# Patient Record
Sex: Male | Born: 1960 | Race: White | Hispanic: No | Marital: Married | Smoking: Never smoker
Health system: Southern US, Community
[De-identification: ages and names within clinical notes are randomized; demographics above are authoritative.]

## PROBLEM LIST (undated history)

## (undated) DIAGNOSIS — K219 Gastro-esophageal reflux disease without esophagitis: Secondary | ICD-10-CM

## (undated) DIAGNOSIS — M1990 Unspecified osteoarthritis, unspecified site: Secondary | ICD-10-CM

## (undated) DIAGNOSIS — E669 Obesity, unspecified: Secondary | ICD-10-CM

## (undated) DIAGNOSIS — C801 Malignant (primary) neoplasm, unspecified: Secondary | ICD-10-CM

## (undated) DIAGNOSIS — I10 Essential (primary) hypertension: Secondary | ICD-10-CM

## (undated) HISTORY — DX: Malignant (primary) neoplasm, unspecified: C80.1

## (undated) HISTORY — PX: POLYPECTOMY: SHX149

## (undated) HISTORY — PX: TONSILLECTOMY: SUR1361

## (undated) HISTORY — PX: KNEE SURGERY: SHX244

## (undated) HISTORY — PX: UPPER GASTROINTESTINAL ENDOSCOPY: SHX188

## (undated) HISTORY — PX: APPENDECTOMY: SHX54

## (undated) HISTORY — DX: Obesity, unspecified: E66.9

## (undated) HISTORY — PX: COLONOSCOPY: SHX174

## (undated) HISTORY — DX: Unspecified osteoarthritis, unspecified site: M19.90

## (undated) HISTORY — DX: Essential (primary) hypertension: I10

## (undated) HISTORY — DX: Gastro-esophageal reflux disease without esophagitis: K21.9

---

## 1985-07-19 HISTORY — PX: ANAL FISTULECTOMY: SHX1139

## 1999-12-01 ENCOUNTER — Ambulatory Visit (HOSPITAL_COMMUNITY): Admission: RE | Admit: 1999-12-01 | Discharge: 1999-12-01 | Payer: Self-pay | Admitting: *Deleted

## 1999-12-15 ENCOUNTER — Encounter: Payer: Self-pay | Admitting: *Deleted

## 1999-12-15 ENCOUNTER — Ambulatory Visit (HOSPITAL_COMMUNITY): Admission: RE | Admit: 1999-12-15 | Discharge: 1999-12-15 | Payer: Self-pay | Admitting: *Deleted

## 2001-01-11 ENCOUNTER — Encounter: Payer: Self-pay | Admitting: *Deleted

## 2001-01-11 ENCOUNTER — Encounter: Admission: RE | Admit: 2001-01-11 | Discharge: 2001-01-11 | Payer: Self-pay | Admitting: *Deleted

## 2001-03-07 ENCOUNTER — Encounter: Admission: RE | Admit: 2001-03-07 | Discharge: 2001-06-05 | Payer: Self-pay | Admitting: *Deleted

## 2004-05-25 ENCOUNTER — Ambulatory Visit: Payer: Self-pay | Admitting: Cardiology

## 2007-10-16 ENCOUNTER — Ambulatory Visit: Payer: Self-pay | Admitting: Vascular Surgery

## 2008-10-08 ENCOUNTER — Encounter: Payer: Self-pay | Admitting: Internal Medicine

## 2008-10-08 ENCOUNTER — Ambulatory Visit: Payer: Self-pay | Admitting: Internal Medicine

## 2008-10-15 ENCOUNTER — Encounter: Payer: Self-pay | Admitting: Internal Medicine

## 2008-10-15 ENCOUNTER — Ambulatory Visit: Payer: Self-pay

## 2008-10-30 ENCOUNTER — Telehealth: Payer: Self-pay | Admitting: Internal Medicine

## 2008-10-30 DIAGNOSIS — R002 Palpitations: Secondary | ICD-10-CM | POA: Insufficient documentation

## 2008-11-21 ENCOUNTER — Ambulatory Visit: Payer: Self-pay

## 2008-11-21 ENCOUNTER — Ambulatory Visit: Payer: Self-pay | Admitting: Internal Medicine

## 2008-11-21 DIAGNOSIS — E785 Hyperlipidemia, unspecified: Secondary | ICD-10-CM | POA: Insufficient documentation

## 2008-11-21 LAB — CONVERTED CEMR LAB
ALT: 211 units/L — ABNORMAL HIGH (ref 0–53)
AST: 98 units/L — ABNORMAL HIGH (ref 0–37)
Albumin: 4.2 g/dL (ref 3.5–5.2)
Alkaline Phosphatase: 64 units/L (ref 39–117)
Bilirubin, Direct: 0.1 mg/dL (ref 0.0–0.3)
Cholesterol: 150 mg/dL (ref 0–200)
HDL: 40.6 mg/dL (ref >39.00)
LDL Cholesterol: 88 mg/dL (ref 0–99)
Total Bilirubin: 1.2 mg/dL (ref 0.3–1.2)
Total CHOL/HDL Ratio: 4
Total Protein: 7.2 g/dL (ref 6.0–8.3)
Triglycerides: 106 mg/dL (ref 0.0–149.0)
VLDL: 21.2 mg/dL (ref 0.0–40.0)

## 2008-11-22 ENCOUNTER — Telehealth (INDEPENDENT_AMBULATORY_CARE_PROVIDER_SITE_OTHER): Payer: Self-pay | Admitting: *Deleted

## 2008-11-26 ENCOUNTER — Encounter: Payer: Self-pay | Admitting: Internal Medicine

## 2008-12-03 ENCOUNTER — Telehealth: Payer: Self-pay | Admitting: Internal Medicine

## 2008-12-04 ENCOUNTER — Telehealth (INDEPENDENT_AMBULATORY_CARE_PROVIDER_SITE_OTHER): Payer: Self-pay | Admitting: *Deleted

## 2008-12-10 ENCOUNTER — Ambulatory Visit: Payer: Self-pay

## 2008-12-10 ENCOUNTER — Ambulatory Visit: Payer: Self-pay | Admitting: Internal Medicine

## 2008-12-10 ENCOUNTER — Encounter (INDEPENDENT_AMBULATORY_CARE_PROVIDER_SITE_OTHER): Payer: Self-pay | Admitting: *Deleted

## 2008-12-10 ENCOUNTER — Encounter: Payer: Self-pay | Admitting: Internal Medicine

## 2008-12-10 LAB — CONVERTED CEMR LAB
BUN: 14 mg/dL (ref 6–23)
CO2: 24 meq/L (ref 19–32)
Calcium: 9.3 mg/dL (ref 8.4–10.5)
Chloride: 107 meq/L (ref 96–112)
Creatinine, Ser: 1 mg/dL (ref 0.4–1.5)
GFR calc non Af Amer: 84.45 mL/min (ref >60)
Glucose, Bld: 115 mg/dL — ABNORMAL HIGH (ref 70–99)
Potassium: 4.2 meq/L (ref 3.5–5.1)
Sodium: 139 meq/L (ref 135–145)

## 2008-12-19 ENCOUNTER — Ambulatory Visit (HOSPITAL_COMMUNITY): Admission: RE | Admit: 2008-12-19 | Discharge: 2008-12-19 | Payer: Self-pay | Admitting: Internal Medicine

## 2008-12-19 ENCOUNTER — Ambulatory Visit: Payer: Self-pay | Admitting: Cardiovascular Disease

## 2009-01-09 ENCOUNTER — Telehealth: Payer: Self-pay | Admitting: Internal Medicine

## 2009-01-27 ENCOUNTER — Telehealth: Payer: Self-pay | Admitting: Internal Medicine

## 2009-02-10 ENCOUNTER — Encounter: Payer: Self-pay | Admitting: Internal Medicine

## 2009-03-04 ENCOUNTER — Telehealth: Payer: Self-pay | Admitting: Internal Medicine

## 2009-04-07 ENCOUNTER — Telehealth: Payer: Self-pay | Admitting: Internal Medicine

## 2010-11-20 ENCOUNTER — Other Ambulatory Visit: Payer: Self-pay | Admitting: *Deleted

## 2010-11-20 MED ORDER — METOPROLOL SUCCINATE ER 25 MG PO TB24: 25.0000 mg | ORAL_TABLET | Freq: Every day | ORAL | Status: DC

## 2010-11-20 MED ORDER — LISINOPRIL 10 MG PO TABS: 10.0000 mg | ORAL_TABLET | Freq: Every day | ORAL | Status: DC

## 2010-11-23 ENCOUNTER — Telehealth: Payer: Self-pay | Admitting: Internal Medicine

## 2010-11-23 NOTE — Telephone Encounter (Signed)
Pt has changed Pharm and they have faxed [place] twice regarding refills and we have not responded so he would like to talk to someone about it

## 2010-11-23 NOTE — Telephone Encounter (Signed)
Called in refills for lisinopril 10 mg and metoprolol 25 mg  Quantity  Of 90 with 3 refills on both ./cy

## 2010-12-01 NOTE — Procedures (Signed)
DUPLEX DEEP VENOUS EXAM - UPPER EXTREMITY   INDICATION:  Tingling left shoulder to arm which is intermittent and not  positional.   HISTORY:  Edema:  No.  Trauma/Surgery:  No.  Pain:  No.  PE:  No.  Previous DVT:  No.  Anticoagulants:  No.  Other:   DUPLEX EXAM:                                             Bas/                IJV   SCV     AXV    BrachV  Ceph V                R  L  R   L   R  L   R   L   R  L  Thrombosis    0  0  0   0      0       0      0  Spontaneous   +  +  +   +      +       +      +  Phasic        +  +  +   +      +       +      +  Augmentation  Compressible  +  +  +   +      +       +      +  Competent     +  +  +   +      +       +      +  Legend:  + - yes  o - no  p - partial  D - decreased   IMPRESSION:  1. No evidence of left upper extremity venous thrombosis.  2. Limited evaluation for left thoracic outlet syndrome was negative.  3. Dr. Cleta Alberts was notified of these results.   ___________________________________________  Janetta Hora Darrick Penna, MD   DP/MEDQ  D:  10/16/2007  T:  10/16/2007  Job:  [id number]

## 2010-12-01 NOTE — Letter (Signed)
October 08, 2008    Harrel Lemon. Merla Riches, MD  [address]  [place], [place] [address]   RE:  GAVINN, COLLARD  MRN:  [id number]  /  DOB:  1960-10-01   Dear Nadine Counts,   It was a pleasure seeing Mr. Christensen at your request for his history of  hypertrophic heart disease and his desire to get back to exercise.   The patient is a 50 year old gentleman whom I met 5 years ago at the  request of Dr. Loraine Leriche Pulsipher for risk stratification regarding possible  hypertrophic cardiomyopathy.  His 80 year old brother had died suddenly  at [employer] and the possibility was raised as to whether he had died  as a consequence of hypertrophic cardiomyopathy.  That diagnosis was  difficult to make as even on autopsy the information seemed to be  somewhat discordant.  Specifically, his organ weight was 660 g with  concentric hypertrophy and microscopic examination revealed normal  myelocytes and no inflammation present and so, I then reviewed these  results with Dr. Ananias Pilgrim from the [facility] who felt  that the weight was most consistent with hypertrophic cardiomyopathy  notwithstanding the absence of histopathological confirmation.  At that  point, further issues were raised with Mr. Shimabukuro as to how we might  proceed and he declined.  The evolution of this thinking will be  recounted below.   Intercurrently, he had begun exercise, he had lost a bunch of weight,  his mother then died and he had been her primary caretaker and he has  regained some of that weight.  He has a history of hypertension and some  sleep-disordered breathing.  He has a history of hypertension, although  there has been some discordance between home blood pressure monitoring  and office blood pressure monitoring.   He has a history of sleep-disordered breathing, although not significant  daytime somnolence.   He denies exertional chest pain.   His review of systems is broadly negative across multiple organ  systems.   PAST SURGICAL HISTORY:  Notable for appendectomy and tonsillectomy.   SOCIAL HISTORY:  He is married.  He has 3 children.  He does not use  cigarettes or recreational drugs.  He does drink alcohol as much as 3  drinks per day.  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   His medications includes lisinopril 10, metoprolol succinate 25, and  Advil.   He has no known drug allergies.   On examination, he is a healthy-appearing 50 year old gentleman who  weighs 275 pounds, he is 6 feet and 3 inches, his BMI is 34.4, his blood  pressure is 161/92, his pulse was 73, his respirations were 18.  His  HEENT exam demonstrated no icterus or xanthomata.  His neck veins were  flat.  His carotids were brisk and full bilaterally without bruits.  The  back was without kyphosis or scoliosis.  Lungs were clear.  Heart sounds  were regular without murmurs or gallops.  The abdomen was soft with  active bowel sounds without midline pulsation or hepatomegaly.  Femoral  pulses were 2+.  Distal pulses were intact.  There was no clubbing,  cyanosis, or edema.  Neurological exam was grossly normal.  Skin was  warm and dry.   Electrocardiogram dated yesterday demonstrated sinus rhythm at 73 with  intervals of 0.17/0.09/0.38, the axis was 75 degrees.  The  electrocardiogram was otherwise normal.   IMPRESSION:  1. History of left  ventricular hypertrophy by echo with measurements      about 13 mm, 5 or 6 years ago.  2. Possible family history of hypertrophic cardiomyopathy with the      death of his brother with the pathological findings as noted above.  3. Significant obesity.  4. Elevated blood pressure, question hypertension - white-coat.  5. Sleep-disordered breathing, question sleep apnea.   Nadine Counts, Mr. Warshaw has modest hypertrophy of his left ventricle in the  setting of his brother dying with significant hypertrophic heart  disease, which has been presumed to be  hypertrophic cardiomyopathy based  on my discussions with both, Dr. Ananias Pilgrim and then yesterday with Dr.  Nolon Rod.  The implications of hypertrophic cardiomyopathy relate to  exercise and risk stratification for dying.  To that end, our first goal  is to try to determine whether that is the right diagnosis.  Two issues  come to mind, the first is to repeat the echo and see whether he is now  morphologically more likely to meet criteria for hypertrophic  cardiomyopathy and if so risk stratification would include Holter  monitoring looking for nonsustained ventricular tachycardia and exercise  testing looking for abnormal vasomotor responses.  The second issue is  whether genetic testing may help [place] here.  Dr. Jarold Motto from [facility] and  Dr. Regino Schultze from [facility] have both discussed recently the value of genetic  testing in making a diagnosis of hypertrophic cardiomyopathy and there  is some variability and sensitivity in the range between 60 and 70 and  may be as high as 90%.  However, determination of risk derives from some  of the aforementioned testing and so that approach may be the more  efficient clinically to ascertain risk.  The potential value of genetic  testing here would derive from our ability then to assess Mr. Bertsch  children and their harboring of a gene.  It may well be worth taking  both approaches as mentioned above.   As this relates to his elevated blood pressure, this issue about white-  coat hypertension is certainly something which you are more familiar  then I.  The little bit of reading that I have done recently has  suggested ambulatory blood pressure monitoring helps to distinguish  those people who have real hypertension from those who do not and  dictating therapy.  That may be of some value, although currently on  therapy may not make as much difference.   I will look forward to getting the echo results back to you.  We will  plan thereafter to undertake  stress testing and some manner of fashion  with blood pressure monitoring and then if the evidence further supports  hypertrophic cardiomyopathy, then Holter monitoring and consideration of  genetic testing would be in order.   Thanks very much for asking [place] to see him.    Sincerely,      Duke Salvia, MD, [facility]  Electronically Signed    SCK/MedQ  DD: 10/09/2008  DT: 10/09/2008  Job #: [id number]

## 2010-12-04 NOTE — Procedures (Signed)
[place]  Patient:    Nathaniel Velazquez, Nathaniel Velazquez                    MRN: [id number] Proc. Date: 12/01/99 Adm. Date:  [id number] Disc. Date: [id number] Attending:  Mingo Amber CC:         Colen Darling. Charmayne Sheer., M.D.                           Procedure Report  PROCEDURE PERFORMED:  Video upper endoscopy and video colonoscopy.  ENDOSCOPIST:  Roosvelt Harps, M.D.  INDICATIONS:   A 50 year old male with a prior history of a rectal fistula and a family history of the same who recently had rectal bleeding.  He also is complaining of left upper quadrant discomfort which seems to respond to Prevacid and he has abnormal liver function tests.  Rule out peptic disease of Crohns disease.  PREPARATION:  He is n.p.o. since midnight having taken Phospho-Soda prep and a clear liquid diet.  The mucosa is clean.  PREPROCEDURE SEDATION:   Prior to the upper endoscopy he received a total of 10 mg of Versed and 100 mg of Demerol IV.  In addition, he was on 2L of nasal cannula oxygen and his throat was anesthetized with Hurricaine spray.  DESCRIPTION OF PROCEDURE:  The Olympus video endoscope was inserted via the mouth and advanced easily through the upper esophageal sphincter.  Intubation was then carried out well into the descending duodenum.  On withdrawal, the mucosa was carefully evaluated.  The descending duodenum and bulb appeared normal as did the entire stomach including antrum and body.  On retroflex view of the gastroesophageal junction there may have been minimal laxity of the lower esophageal sphincter area but on withdrawal through this area there was no sign of a hiatal hernia or esophagitis.  The patient tolerated the procedure well.  There were no biopsies taken and no noted complications.  At the conclusion his position was reversed for procedure #2, video colonoscopy. He received no additional sedation.  The Olympus video colonoscope was inserted  via the rectum and advanced easily all the way to the cecum.  The cecal landmarks were identified and photographed.  On withdrawal, the mucosa was carefully evaluated and found to be entirely normal from cecum to retroflex view of the rectum.  There were no areas of inflammation, neoplasia, diverticulosis or significant hemorrhoids noted.  The patient tolerated the procedure well.  There were no noted complications and no biopsies were taken.  IMPRESSION:  Mild rectal bleeding which is likely secondary to a fissure or hemorrhoid which has since resolved.  It should be noted that preprocedure laboratory tests included a normal CBC and sedimentation rate, cholesterol of 147, triglycerides of 116.  Hepatitis serologies were negative for A, B, and C.  Liver function tests revealed an SGOT of 46 and an SGPT of 115.  Lipase and other LKTs were normal.  IMPRESSION:  Essentially normal upper and lower endoscopy in an obese adult male weighing 256 pounds with abnormal liver function tests.  I suspect these are secondary to fatty liver and if he has not yet had a sonogram, it will be ordered today.  He will return to the office in three weeks for follow-up.  He is reassured about his upper and lower endoscopy but he needs to be on a high fiber, low fat, weight reducing diet. RECOMMENDATIONS: DD:  12/01/99 TD:  12/03/99 Job: [id number]

## 2011-01-29 ENCOUNTER — Ambulatory Visit (INDEPENDENT_AMBULATORY_CARE_PROVIDER_SITE_OTHER): Payer: Self-pay | Admitting: General Surgery

## 2011-03-04 ENCOUNTER — Ambulatory Visit (INDEPENDENT_AMBULATORY_CARE_PROVIDER_SITE_OTHER): Payer: BC Managed Care – PPO | Admitting: General Surgery

## 2012-01-26 ENCOUNTER — Other Ambulatory Visit: Payer: Self-pay | Admitting: *Deleted

## 2012-01-26 MED ORDER — LISINOPRIL 10 MG PO TABS: 10.0000 mg | ORAL_TABLET | Freq: Every day | ORAL | Status: DC

## 2012-01-26 MED ORDER — METOPROLOL SUCCINATE ER 25 MG PO TB24: 25.0000 mg | ORAL_TABLET | Freq: Every day | ORAL | Status: DC

## 2012-01-26 NOTE — Telephone Encounter (Signed)
Refilled metoprolol and lisinopril for # 90 only

## 2012-04-21 ENCOUNTER — Other Ambulatory Visit: Payer: Self-pay | Admitting: Internal Medicine

## 2012-04-21 MED ORDER — LISINOPRIL 10 MG PO TABS: 10.0000 mg | ORAL_TABLET | Freq: Every day | ORAL | Status: DC

## 2012-04-21 MED ORDER — METOPROLOL SUCCINATE ER 25 MG PO TB24: 25.0000 mg | ORAL_TABLET | Freq: Every day | ORAL | Status: DC

## 2012-04-21 NOTE — Telephone Encounter (Signed)
Refill :  Office # [phone].   Ref # [id number].

## 2012-05-25 ENCOUNTER — Telehealth: Payer: Self-pay | Admitting: Internal Medicine

## 2012-05-25 NOTE — Telephone Encounter (Signed)
Pt needs an appointment for refills.... Please call to schedule follow up

## 2012-05-25 NOTE — Telephone Encounter (Signed)
Pt needs refills of metoprolol and linsinopril, pt requesting 90 days supply with refills, when done pls call pt wants to make sure  [phone] , uses [email or web address] [phone]

## 2012-06-19 ENCOUNTER — Encounter: Payer: Self-pay | Admitting: Internal Medicine

## 2012-06-19 ENCOUNTER — Other Ambulatory Visit: Payer: Self-pay | Admitting: *Deleted

## 2012-06-19 ENCOUNTER — Ambulatory Visit (INDEPENDENT_AMBULATORY_CARE_PROVIDER_SITE_OTHER): Payer: Self-pay | Admitting: Internal Medicine

## 2012-06-19 VITALS — BP 155/81 | HR 68 | Ht 75.0 in | Wt 285.0 lb

## 2012-06-19 DIAGNOSIS — I10 Essential (primary) hypertension: Secondary | ICD-10-CM | POA: Insufficient documentation

## 2012-06-19 DIAGNOSIS — E669 Obesity, unspecified: Secondary | ICD-10-CM | POA: Insufficient documentation

## 2012-06-19 DIAGNOSIS — E7800 Pure hypercholesterolemia, unspecified: Secondary | ICD-10-CM

## 2012-06-19 DIAGNOSIS — R002 Palpitations: Secondary | ICD-10-CM

## 2012-06-19 DIAGNOSIS — E785 Hyperlipidemia, unspecified: Secondary | ICD-10-CM

## 2012-06-19 LAB — LIPID PANEL
HDL: 49.3 mg/dL (ref >39.00)
Total CHOL/HDL Ratio: 4
Triglycerides: 282 mg/dL — ABNORMAL HIGH (ref 0.0–149.0)
VLDL: 56.4 mg/dL — ABNORMAL HIGH (ref 0.0–40.0)

## 2012-06-19 LAB — LDL CHOLESTEROL, DIRECT: Direct LDL: 91.3 mg/dL

## 2012-06-19 MED ORDER — METOPROLOL SUCCINATE ER 25 MG PO TB24: 25.0000 mg | ORAL_TABLET | Freq: Every day | ORAL | Status: DC

## 2012-06-19 MED ORDER — LISINOPRIL 10 MG PO TABS: 10.0000 mg | ORAL_TABLET | Freq: Every day | ORAL | Status: DC

## 2012-06-19 NOTE — Assessment & Plan Note (Signed)
We will recheck his lipids today.

## 2012-06-19 NOTE — Assessment & Plan Note (Signed)
Blood pressure is borderline elevated. He is having some grogginess with his medications. He feels nervousness and he suggested. Will have him take his medications at night.  The ultimate evaluation from Dr. Modesto Charon at [facility] apparently was that he did not have HCM

## 2012-06-19 NOTE — Patient Instructions (Signed)
LABS:  HgbA1c, TSH, Lipid Profile, Direct LDL.  Start taking meds in the evening.  Your physician wants you to follow-up in: 6 months with Dr. Graciela Husbands. You will receive a reminder letter in the mail two months in advance. If you don't receive a letter, please call our office to schedule the follow-up appointment.

## 2012-06-19 NOTE — Progress Notes (Signed)
History and Physical  Patient ID: Nathaniel Velazquez MRN: [id number], SOB: 1960-10-07 51 y.o. Date of Encounter: 06/19/2012, 3:31 PM  Primary Physician: No primary provider on file. Primary Cardiologist:  New  Primary Electrophysiologist:  Rejoining sk  Chief Complaint: reestablish followup  History of Present Illness: Nathaniel Velazquez is a 51 y.o. male  seen after a hiatus of more than 3 years. We saw him originally about 5 or 6 years ago after the death of his brother in whom an autopsy had demonstrated hypertrophic cardiomyopathy. He had another brother who died suddenly; at autopsy report did not describe hypertrophic cardiomyopathy. He underwent testing here including an MR that was negative but showed mild hypertrophy, treadmill testing with normal blood pressure response and he was then referred to Dr. Regino Schultze at [facility] who felt, according to the patient but for whom we have no corroborated records, that he did not have HCM  He has put on about 30 pounds. He is not exercising. He snores. He has noted no change in his exercise tolerance. He denies chest pain shortness of breath or peripheral edema. There have been no palpitations of late.  He's noted some lightheadedness when he stands. Some grogginess occurs if he takes his blood pressure medications in the morning so he takes them routinely at noon she was  Past Medical History  Diagnosis Date  . Hypertension   . Obesity      No past surgical history on file.    Current Outpatient Prescriptions  Medication Sig Dispense Refill  . lisinopril (PRINIVIL,ZESTRIL) 10 MG tablet Take 1 tablet (10 mg total) by mouth daily.  30 tablet  0  . metoprolol succinate (TOPROL XL) 25 MG 24 hr tablet Take 1 tablet (25 mg total) by mouth daily.  30 tablet  0  . clobetasol ointment (TEMOVATE) 0.05 % As directed         Allergies: No Known Allergies   History  Substance Use Topics  . Smoking status: Never Smoker   . Smokeless tobacco: Not on  file  . Alcohol Use: Yes      Family History  Problem Relation Age of Onset  . Hypertrophic cardiomyopathy Brother       ROS:  Please see the history of present illness.     All other systems reviewed and negative.   Vital Signs: Blood pressure 155/81, pulse 68, height 6\' 3"  (1.905 m), weight 285 lb (129.275 kg).  PHYSICAL EXAM: General:  Well nourished, well developed male in no acute distress  HEENT: normal Lymph: no adenopathy Neck: no JVD Endocrine:  No thryomegaly Vascular: No carotid bruits; FA pulses 2+ bilaterally without bruits Cardiac:  normal S1, S2; RRR; no murmur Back: without kyphosis/scoliosis, no CVA tenderness Lungs:  clear to auscultation bilaterally, no wheezing, rhonchi or rales Abd: soft, nontender, no hepatomegaly Ext: no edema Musculoskeletal:  No deformities, BUE and BLE strength normal and equal Skin: warm and dry Neuro:  CNs 2-12 intact, no focal abnormalities noted Psych:  Normal affect   EKG:  Sinus rhythm at 68 Intervals 17/09/37 Axis LXI Otherwise normal  Labs:   No results found for this basename: WBC,  HGB,  HCT,  MCV,  PLT   No results found for this basename: NA,K,CL,CO2,BUN,CREATININE,CALCIUM,LABALBU,PROT,BILITOT,ALKPHOS,ALT,AST,GLUCOSE in the last 168 hours No results found for this basename: CKTOTAL:4,CKMB:4,TROPONINI:4 in the last 72 hours Lab Results  Component Value Date   CHOL 150 11/21/2008   HDL 40.60 11/21/2008   LDLCALC 88 11/21/2008   TRIG 106.0  11/21/2008   No results found for this basename: DDIMER   BNP No results found for this basename: probnp       ASSESSMENT AND PLAN:

## 2012-06-19 NOTE — Assessment & Plan Note (Signed)
And encourage him to be getting exercise again. We will plan to reassess this in 6 months and undertake a sleep study if he has not lost weight as he does have significant snoring..  In addition, his blood sugar and lipids have been borderline. We will recheck them today.

## 2012-06-27 ENCOUNTER — Encounter: Payer: Self-pay | Admitting: Internal Medicine

## 2012-09-02 ENCOUNTER — Other Ambulatory Visit: Payer: Self-pay

## 2012-12-27 ENCOUNTER — Ambulatory Visit: Payer: BC Managed Care – PPO | Admitting: Internal Medicine

## 2013-01-25 ENCOUNTER — Ambulatory Visit: Payer: BC Managed Care – PPO | Admitting: Internal Medicine

## 2013-03-29 ENCOUNTER — Ambulatory Visit: Payer: BC Managed Care – PPO | Admitting: Internal Medicine

## 2013-05-11 ENCOUNTER — Encounter: Payer: Self-pay | Admitting: *Deleted

## 2013-05-17 ENCOUNTER — Encounter: Payer: Self-pay | Admitting: Internal Medicine

## 2013-05-17 ENCOUNTER — Ambulatory Visit (INDEPENDENT_AMBULATORY_CARE_PROVIDER_SITE_OTHER): Payer: BC Managed Care – PPO | Admitting: Internal Medicine

## 2013-05-17 VITALS — BP 134/80 | HR 66 | Ht 75.0 in | Wt 286.0 lb

## 2013-05-17 DIAGNOSIS — Z0000 Encounter for general adult medical examination without abnormal findings: Secondary | ICD-10-CM

## 2013-05-17 DIAGNOSIS — I10 Essential (primary) hypertension: Secondary | ICD-10-CM

## 2013-05-17 DIAGNOSIS — E785 Hyperlipidemia, unspecified: Secondary | ICD-10-CM

## 2013-05-17 DIAGNOSIS — E7800 Pure hypercholesterolemia, unspecified: Secondary | ICD-10-CM

## 2013-05-17 DIAGNOSIS — R002 Palpitations: Secondary | ICD-10-CM

## 2013-05-17 MED ORDER — METOPROLOL SUCCINATE ER 25 MG PO TB24: 25.0000 mg | ORAL_TABLET | Freq: Every day | ORAL | Status: DC

## 2013-05-17 MED ORDER — LISINOPRIL 10 MG PO TABS: 10.0000 mg | ORAL_TABLET | Freq: Every day | ORAL | Status: DC

## 2013-05-17 NOTE — Patient Instructions (Signed)
Your physician recommends that you schedule a follow-up appointment as needed.   Your physician recommends that you continue on your current medications as directed. Please refer to the Current Medication list given to you today.  

## 2013-05-17 NOTE — Progress Notes (Signed)
      Patient Care Team: Tonye Pearson, MD as PCP - General (Pediatrics)   HPI  Nathaniel Velazquez is a 52 y.o. male Seen in followup for a history of hypertensive heart disease.   We saw him originally about 5 or 6 years ago after the death of his brother in whom an autopsy had demonstrated hypertrophic cardiomyopathy. He had another brother who died suddenly; at autopsy report did not describe hypertrophic cardiomyopathy. He underwent testing here including an MR that was negative but showed mild hypertrophy, treadmill testing with normal blood pressure response and he was then referred to Dr. Regino Schultze at [facility] who felt, according to the patient but for whom we have no corroborated records, that he did not have HCM  He as some complaints of orthostatic intolerance. We talked about taking his medications at night; he continues to take it during the day.  Past Medical History  Diagnosis Date  . Hypertension   . Obesity     No past surgical history on file.  Current Outpatient Prescriptions  Medication Sig Dispense Refill  . clobetasol ointment (TEMOVATE) 0.05 % As directed      . lisinopril (PRINIVIL,ZESTRIL) 10 MG tablet Take 1 tablet (10 mg total) by mouth daily.  30 tablet  0  . metoprolol succinate (TOPROL XL) 25 MG 24 hr tablet Take 1 tablet (25 mg total) by mouth daily.  30 tablet  3   No current facility-administered medications for this visit.    No Known Allergies  Review of Systems negative except from HPI and PMH  Physical Exam BP 134/80  Pulse 66  Ht 6\' 3"  (1.905 m)  Wt 286 lb (129.729 kg)  BMI 35.75 kg/m2 Well developed and nourished in no acute distress HENT normal Neck supple with JVP-flat Clear Regular rate and rhythm, no murmurs or gallops Abd-soft with active BS No Clubbing cyanosis edema Skin-warm and dry A & Oriented  Grossly normal sensory and motor function  ECG demonstrates sinus rhythm at 56  16/10/38 Axis LXXX  Assessment and  Plan

## 2013-05-17 NOTE — Assessment & Plan Note (Signed)
Well controlled on current medications. We have talked about taking his medications in the evening so as to mitigate some of his orthostatic intolerance. He will follow up with Dr. Merla Riches.

## 2013-05-18 ENCOUNTER — Other Ambulatory Visit: Payer: BC Managed Care – PPO

## 2013-05-24 ENCOUNTER — Other Ambulatory Visit: Payer: Self-pay | Admitting: *Deleted

## 2014-03-01 ENCOUNTER — Ambulatory Visit (INDEPENDENT_AMBULATORY_CARE_PROVIDER_SITE_OTHER): Payer: BC Managed Care – PPO | Admitting: Internal Medicine

## 2014-03-01 ENCOUNTER — Other Ambulatory Visit: Payer: Self-pay | Admitting: Internal Medicine

## 2014-03-01 VITALS — BP 136/84 | HR 82 | Temp 98.1°F | Resp 16 | Ht 74.25 in | Wt 280.4 lb

## 2014-03-01 DIAGNOSIS — R209 Unspecified disturbances of skin sensation: Secondary | ICD-10-CM

## 2014-03-01 DIAGNOSIS — R7309 Other abnormal glucose: Secondary | ICD-10-CM

## 2014-03-01 DIAGNOSIS — I10 Essential (primary) hypertension: Secondary | ICD-10-CM

## 2014-03-01 DIAGNOSIS — R202 Paresthesia of skin: Secondary | ICD-10-CM

## 2014-03-01 DIAGNOSIS — R739 Hyperglycemia, unspecified: Secondary | ICD-10-CM

## 2014-03-01 LAB — CBC
HEMATOCRIT: 47.7 % (ref 39.0–52.0)
Hemoglobin: 16.6 g/dL (ref 13.0–17.0)
MCH: 31.3 pg (ref 26.0–34.0)
MCHC: 34.8 g/dL (ref 30.0–36.0)
MCV: 90 fL (ref 78.0–100.0)
Platelets: 222 10*3/uL (ref 150–400)
RBC: 5.3 MIL/uL (ref 4.22–5.81)
RDW: 13.7 % (ref 11.5–15.5)
WBC: 6.1 10*3/uL (ref 4.0–10.5)

## 2014-03-01 LAB — POCT GLYCOSYLATED HEMOGLOBIN (HGB A1C): Hemoglobin A1C: 5.3

## 2014-03-01 LAB — LIPID PANEL
CHOL/HDL RATIO: 3.4 ratio
Cholesterol: 170 mg/dL (ref 0–200)
HDL: 50 mg/dL (ref >39)
LDL Cholesterol: 82 mg/dL (ref 0–99)
TRIGLYCERIDES: 188 mg/dL — AB (ref <150)
VLDL: 38 mg/dL (ref 0–40)

## 2014-03-01 LAB — COMPREHENSIVE METABOLIC PANEL
ALBUMIN: 4.5 g/dL (ref 3.5–5.2)
ALK PHOS: 60 U/L (ref 39–117)
ALT: 97 U/L — ABNORMAL HIGH (ref 0–53)
AST: 41 U/L — ABNORMAL HIGH (ref 0–37)
BUN: 17 mg/dL (ref 6–23)
CO2: 28 mEq/L (ref 19–32)
CREATININE: 1.07 mg/dL (ref 0.50–1.35)
Calcium: 10.2 mg/dL (ref 8.4–10.5)
Chloride: 101 mEq/L (ref 96–112)
GLUCOSE: 112 mg/dL — AB (ref 70–99)
POTASSIUM: 4.8 meq/L (ref 3.5–5.3)
Sodium: 137 mEq/L (ref 135–145)
Total Bilirubin: 0.7 mg/dL (ref 0.2–1.2)
Total Protein: 7.5 g/dL (ref 6.0–8.3)

## 2014-03-01 LAB — TSH: TSH: 2.188 u[IU]/mL (ref 0.350–4.500)

## 2014-03-01 MED ORDER — METOPROLOL SUCCINATE ER 25 MG PO TB24: 25.0000 mg | ORAL_TABLET | Freq: Every day | ORAL | Status: DC

## 2014-03-01 MED ORDER — GABAPENTIN 300 MG PO CAPS: 300.0000 mg | ORAL_CAPSULE | Freq: Three times a day (TID) | ORAL | Status: DC

## 2014-03-01 MED ORDER — LISINOPRIL 10 MG PO TABS: 10.0000 mg | ORAL_TABLET | Freq: Every day | ORAL | Status: DC

## 2014-03-01 NOTE — Progress Notes (Signed)
Left a message for patient to return call to schedule a CPE with Dr. Laney Pastor in the next 3 months.

## 2014-03-01 NOTE — Progress Notes (Addendum)
Subjective:    Patient ID: Nathaniel Velazquez, male    DOB: 12-20-1960, 53 y.o.   MRN: [id number]  HPI Chief Complaint  Patient presents with  . Foot Problem    Left - tingling;numbess x 1 wk  No falls;injuries  . Medication Refill    Lisinopril; Toprol XL   This chart was scribed for Nathaniel Lin, MD by Thea Alken, ED Scribe. This patient was seen in room 1 and the patient's care was started at 9:25 AM.  HPI Comments: Nathaniel Velazquez is a 53 y.o. male who presents to the [facility] with constant, unchanged, left, medial. foot pain onset 1 week. Pt denies falls and injuries to foot prior to pain. Pt describes the pain as tingling and burning but reports he still has touch sensation to foot. Pt denies worsening factors. Pt denies new shoes. Pt denies back pain, thigh, and calf pain. Pt denies trouble with gait. Pt denies rash to foot. Pt denies recent falls and injuries.   Pt states he has been seeing cardiologist, Dr. Caryl Comes and was last seen by him 3 months ago.  Pt was referred to Dr. Caryl Comes due to a family h/o cardiac hypertrophy. Pt reports his brother died from this. Has underlying hypertension and obesity. Pt is requesting a medication refill of lisinopril and toprol XL.  Pt is fasting //there have been questions in the past about borderline hyperglycemia   Past Medical History  Diagnosis Date  . Hypertension   . Obesity    History reviewed. No pertinent past surgical history. Prior to Admission medications   Medication Sig Start Date End Date Taking? Authorizing Provider  clobetasol ointment (TEMOVATE) 0.05 % As directed 05/15/12  Yes Historical Provider, MD  lisinopril (PRINIVIL,ZESTRIL) 10 MG tablet Take 1 tablet (10 mg total) by mouth daily. 05/17/13 05/17/14 Yes Deboraha Sprang, MD  metoprolol succinate (TOPROL XL) 25 MG 24 hr tablet Take 1 tablet (25 mg total) by mouth daily. 05/17/13 05/17/14 Yes Deboraha Sprang, MD   Review of Systems    Musculoskeletal: Negative for back pain and gait problem.  Skin: Negative for rash.  Neurological: Negative for weakness and numbness.   no weight loss or fatigue No fever or night sweats No chest pain or palpitations No shortness of breath No abdominal pain  Objective:   Physical Exam  Nursing note and vitals reviewed. Constitutional: He is oriented to person, place, and time. He appears well-developed and well-nourished. No distress.  HENT:  Head: Normocephalic and atraumatic.  Eyes: Conjunctivae and EOM are normal. Pupils are equal, round, and reactive to light.  Neck: Neck supple. No thyromegaly present.  Cardiovascular: Normal rate and regular rhythm.   Pulmonary/Chest: Effort normal.  Musculoskeletal: Normal range of motion. He exhibits no edema and no tenderness.  Ankle and foot with full ROM without injury. No loss of motor function. There is subjective tingling over the first 3 toes and instep but objectively there is full response. Straight leg raise is negative to 90 degrees. Hip and knee full ROM. Calf exam is normal with intact achilles.   Neurological: He is alert and oriented to person, place, and time. He has normal reflexes. No cranial nerve deficit.  Skin: Skin is warm and dry.  Psychiatric: He has a normal mood and affect. His behavior is normal.    Assessment & Plan:   1. Essential hypertension   2. Paresthesia   3. Hyperglycemia    . Meds ordered this encounter  Medications  . lisinopril (PRINIVIL,ZESTRIL) 10 MG tablet    Sig: Take 1 tablet (10 mg total) by mouth daily.    Dispense:  90 tablet    Refill:  3  . metoprolol succinate (TOPROL XL) 25 MG 24 hr tablet    Sig: Take 1 tablet (25 mg total) by mouth daily.    Dispense:  90 tablet    Refill:  3  . gabapentin (NEURONTIN) 300 MG capsule    Sig: Take 1 capsule (300 mg total) by mouth 3 (three) times daily.    Dispense:  30 capsule    Refill:  0   We'll set up a routine physical examination to  go over his health maintenance issues If he does not respond to treatment next add would be peripheral nerve conduction studies   I personally performed the services described in this documentation, which was scribed in my presence. The recorded information has been reviewed and is accurate.

## 2014-03-04 ENCOUNTER — Telehealth: Payer: Self-pay

## 2014-03-04 LAB — HEPATITIS C ANTIBODY: HCV Ab: NEGATIVE

## 2014-03-04 NOTE — Telephone Encounter (Signed)
Spoke with pt. Will pick up the neurontin and he did receive an email from the mail order saying that they received the rx. All is well.

## 2014-03-04 NOTE — Telephone Encounter (Signed)
Message copied by Constance Goltz on Mon Mar 04, 2014  2:35 PM ------      Message from: Leandrew Koyanagi      Created: Fri Mar 01, 2014 12:14 PM       Call-I sent Neurontin for his ankle problem to his drug store and tried to send his blood pressure medicines to his mail order company. Let us know if this is not successful ------

## 2014-03-05 ENCOUNTER — Encounter: Payer: Self-pay | Admitting: Internal Medicine

## 2014-04-10 ENCOUNTER — Ambulatory Visit (INDEPENDENT_AMBULATORY_CARE_PROVIDER_SITE_OTHER): Payer: BC Managed Care – PPO | Admitting: Internal Medicine

## 2014-04-10 ENCOUNTER — Encounter: Payer: Self-pay | Admitting: Internal Medicine

## 2014-04-10 VITALS — BP 133/79 | HR 64 | Temp 98.0°F | Resp 16 | Ht 74.5 in | Wt 283.0 lb

## 2014-04-10 DIAGNOSIS — K760 Fatty (change of) liver, not elsewhere classified: Secondary | ICD-10-CM

## 2014-04-10 DIAGNOSIS — R945 Abnormal results of liver function studies: Secondary | ICD-10-CM

## 2014-04-10 DIAGNOSIS — G5732 Lesion of lateral popliteal nerve, left lower limb: Secondary | ICD-10-CM

## 2014-04-10 DIAGNOSIS — R7989 Other specified abnormal findings of blood chemistry: Secondary | ICD-10-CM

## 2014-04-10 DIAGNOSIS — Z0000 Encounter for general adult medical examination without abnormal findings: Secondary | ICD-10-CM

## 2014-04-10 DIAGNOSIS — Z23 Encounter for immunization: Secondary | ICD-10-CM

## 2014-04-10 DIAGNOSIS — Z1211 Encounter for screening for malignant neoplasm of colon: Secondary | ICD-10-CM

## 2014-04-10 DIAGNOSIS — K7689 Other specified diseases of liver: Secondary | ICD-10-CM

## 2014-04-10 DIAGNOSIS — I10 Essential (primary) hypertension: Secondary | ICD-10-CM

## 2014-04-10 DIAGNOSIS — G5730 Lesion of lateral popliteal nerve, unspecified lower limb: Secondary | ICD-10-CM

## 2014-04-10 LAB — POCT URINALYSIS DIPSTICK
BILIRUBIN UA: NEGATIVE
Glucose, UA: NEGATIVE
KETONES UA: NEGATIVE
Nitrite, UA: NEGATIVE
Protein, UA: NEGATIVE
RBC UA: NEGATIVE
SPEC GRAV UA: 1.015
Urobilinogen, UA: 0.2
pH, UA: 6

## 2014-04-10 NOTE — Patient Instructions (Signed)
Wt Readings from Last 3 Encounters:  04/10/14 283 lb (128.368 kg)  03/01/14 280 lb 6.4 oz (127.189 kg)  05/17/13 286 lb (129.729 kg)

## 2014-04-10 NOTE — Progress Notes (Addendum)
Subjective:    Patient ID: Nathaniel Velazquez, male    DOB: 05/20/1961, 53 y.o.   MRN: [id number]  This chart was scribed for Leandrew Koyanagi, MD by Edison Simon, ED Scribe. This patient was seen in room 25 and the patient's care was started at 2:00 PM.   HPI  HPI Comments: Nathaniel Velazquez is a 53 y.o. male who presents to the Emergency Department for follow up for neuropathy to his left foot which he states has not improved. He states he was seen here 5-6 weeks ago and was prescribed Neurontin to treat neuropathy; he states he took 1 dose of Neurontin but felt dizzy and sluggish and then jittery that night so he states he did not take it again. He states the symptoms were at first in the ball of his foot and the big toe, but now is present in his second toe as well; he denies tingling anywhere else. He reports moving heavy furniture the day before it initially began. He denies history of diabetes. He states he does not stand a lot for work. He denies heart racing, hearing change, or visual changes.   Patient Active Problem List   Diagnosis Date Noted  . Hypertension   . Obesity   . Hyperlipidemia 11/21/2008  . PALPITATIONS 10/30/2008     Medication List       This list is accurate as of: 04/10/14 11:11 AM.  Always use your most recent med list.               clobetasol ointment 0.05 %  Commonly known as:  TEMOVATE  As directed     gabapentin 300 MG capsule  Commonly known as:  NEURONTIN  Take 1 capsule (300 mg total) by mouth 3 (three) times daily.     lisinopril 10 MG tablet  Commonly known as:  PRINIVIL,ZESTRIL  Take 1 tablet (10 mg total) by mouth daily.     metoprolol succinate 25 MG 24 hr tablet  Commonly known as:  TOPROL XL  Take 1 tablet (25 mg total) by mouth daily.         Review of Systems  HENT: Negative for hearing loss.   Eyes: Negative for visual disturbance.  Cardiovascular: Negative for palpitations.  Musculoskeletal: Positive for arthralgias (left  knee). Negative for back pain.  Neurological: Positive for numbness.       Tingling to his left foot  rest neg 14pt form     Objective:   Physical Exam  Nursing note and vitals reviewed. Constitutional: He is oriented to person, place, and time. He appears well-developed and well-nourished.  HENT:  Head: Normocephalic and atraumatic.  Right Ear: External ear normal.  Left Ear: External ear normal.  Nose: Nose normal.  Mouth/Throat: Oropharynx is clear and moist.  Tms and canals clear  Eyes: Conjunctivae and EOM are normal. Pupils are equal, round, and reactive to light.  Neck: Normal range of motion. Neck supple. No thyromegaly present.  Cardiovascular: Normal rate, regular rhythm, normal heart sounds and intact distal pulses.   No murmur heard. Pulmonary/Chest: Effort normal and breath sounds normal. No respiratory distress. He has no wheezes. He has no rales.  Abdominal: Soft. Bowel sounds are normal. He exhibits no distension and no mass. There is no tenderness. There is no rebound and no guarding.  No hepatosplenomegaly  Musculoskeletal: Normal range of motion. He exhibits no edema and no tenderness.  No signs of muscle atrophy  Lymphadenopathy:    He has  no cervical adenopathy.  Neurological: He is alert and oriented to person, place, and time. He has normal reflexes. No cranial nerve deficit. He exhibits normal muscle tone. Coordination normal.  Normal sensation to left foot  Skin: Skin is warm and dry. No rash noted.  No difference in skin tone of area affected by neuropathy  Psychiatric: He has a normal mood and affect. His behavior is normal. Judgment and thought content normal.   BP 133/79  Pulse 64  Temp(Src) 98 F (36.7 C)  Resp 16  Ht 6' 2.5" (1.892 m)  Wt 283 lb (128.368 kg)  BMI 35.86 kg/m2  SpO2 96%        Assessment & Plan:   I have completed the patient encounter in its entirety as documented by the scribe, with editing by me where  necessary. Mazel Villela P. Laney Pastor, M.D. 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 - Plan: Ambulatory referral to Podiatry  Essential hypertension  Hepatic steatosis w/Abnormal LFTs  Special screening for malignant neoplasms, colon - Plan: Ambulatory referral to Colorectal Surgery  Elevated BMI--needs to adhere to consistent weight loss program  See labs 8/14 Results for orders placed in visit on 04/10/14  POCT URINALYSIS DIPSTICK      Result Value Ref Range   Color, UA yellow     Clarity, UA clear     Glucose, UA neg     Bilirubin, UA neg     Ketones, UA neg     Spec Grav, UA 1.015     Blood, UA neg     pH, UA 6.0     Protein, UA neg     Urobilinogen, UA 0.2     Nitrite, UA neg     Leukocytes, UA Trace

## 2014-04-10 NOTE — Progress Notes (Deleted)
   Subjective:    Patient ID: Nathaniel Velazquez, male    DOB: 1960/10/10, 53 y.o.   MRN: [id number]  HPI    Review of Systems  Neurological: Positive for numbness.       Objective:   Physical Exam        Assessment & Plan:

## 2014-04-15 ENCOUNTER — Telehealth: Payer: Self-pay

## 2014-04-15 NOTE — Telephone Encounter (Signed)
[facility] tried to call pt on 04/11/14. Pt notified and given their number so that he can call them back

## 2014-04-15 NOTE — Telephone Encounter (Signed)
Patient is wanting to know if Dr Laney Pastor was going to refer him to have a colonoscopy.     Best#: [phone]

## 2014-04-22 ENCOUNTER — Ambulatory Visit (INDEPENDENT_AMBULATORY_CARE_PROVIDER_SITE_OTHER): Payer: BC Managed Care – PPO | Admitting: Podiatry

## 2014-04-22 ENCOUNTER — Encounter: Payer: Self-pay | Admitting: Podiatry

## 2014-04-22 ENCOUNTER — Ambulatory Visit (INDEPENDENT_AMBULATORY_CARE_PROVIDER_SITE_OTHER): Payer: BC Managed Care – PPO

## 2014-04-22 VITALS — BP 151/82 | HR 64 | Resp 16

## 2014-04-22 DIAGNOSIS — M779 Enthesopathy, unspecified: Secondary | ICD-10-CM

## 2014-04-22 DIAGNOSIS — G5752 Tarsal tunnel syndrome, left lower limb: Secondary | ICD-10-CM

## 2014-04-22 MED ORDER — TRIAMCINOLONE ACETONIDE 10 MG/ML IJ SUSP
10.0000 mg | Freq: Once | INTRAMUSCULAR | Status: AC
  Administered 2014-04-22: 10 mg

## 2014-04-22 NOTE — Progress Notes (Signed)
   Subjective:    Patient ID: Nathaniel Velazquez, male    DOB: June 09, 1961, 53 y.o.   MRN: [id number]  HPI Comments: "I have some weird feelings in this foot"  Patient c/o numbness and tingling plantar forefoot and 1st, 2nd, 3rd, 4th toes left for 8 weeks. He remembers moving a big piece of furniture and noticed symptoms few days later. Dr. Laney Pastor put on med but stopped after one dose (made him feel funny).  Foot Pain      Review of Systems  All other systems reviewed and are negative.      Objective:   Physical Exam        Assessment & Plan:

## 2014-04-23 NOTE — Progress Notes (Signed)
Subjective:     Patient ID: Nathaniel Velazquez, male   DOB: 1960-08-13, 53 y.o.   MRN: [id number]  Foot Pain   patient points to the left foot stating he's been getting some tingling in his big toe second toe and third toe and that it's been going on for about 8 weeks after he moved his daughter to college. Does not remember any specific cause of this and he was tried on gabapentin which she could not tolerate   Review of Systems  All other systems reviewed and are negative.      Objective:   Physical Exam  Nursing note and vitals reviewed. Constitutional: He is oriented to person, place, and time.  Cardiovascular: Intact distal pulses.   Musculoskeletal: Normal range of motion.  Neurological: He is oriented to person, place, and time.  Skin: Skin is warm.   neurovascular status found to be intact with muscle strength adequate and range of motion of the subtalar and midtarsal joint within normal limits. Patient is found to have mild neurological symptoms on the distal left and did have a positive Tinel's sign left ankle upon percussion. 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 At this time I did do an injection of the tarsal canal 3 mg Kenalog 5 mg Xylocaine and advised that ultimately this may require nerve conduction studies and possible tarsal tunnel release if symptoms were to progress. Patient will be seen back if symptoms persist

## 2014-05-13 ENCOUNTER — Encounter: Payer: Self-pay | Admitting: Internal Medicine

## 2014-06-25 DIAGNOSIS — Z0271 Encounter for disability determination: Secondary | ICD-10-CM

## 2014-07-01 ENCOUNTER — Encounter: Payer: Self-pay | Admitting: Gastroenterology

## 2014-08-30 ENCOUNTER — Telehealth: Payer: Self-pay

## 2014-08-30 ENCOUNTER — Ambulatory Visit (AMBULATORY_SURGERY_CENTER): Payer: Self-pay

## 2014-08-30 VITALS — Ht 75.0 in | Wt 290.0 lb

## 2014-08-30 DIAGNOSIS — Z1211 Encounter for screening for malignant neoplasm of colon: Secondary | ICD-10-CM

## 2014-08-30 MED ORDER — MOVIPREP 100 G PO SOLR: 1.0000 | Freq: Once | ORAL | Status: DC

## 2014-08-30 NOTE — Telephone Encounter (Signed)
Pt wants something for "nerves" pre-op.  Thanks, Levada Dy

## 2014-08-30 NOTE — Progress Notes (Signed)
No allergies to eggs or soy No diet/weight loss meds No home oxygen No past problems with anesthesia  Has email  Emmi instructions given for colonoscopy 

## 2014-09-11 ENCOUNTER — Encounter: Payer: Self-pay | Admitting: Gastroenterology

## 2014-09-13 ENCOUNTER — Encounter: Payer: BC Managed Care – PPO | Admitting: Gastroenterology

## 2014-09-26 ENCOUNTER — Telehealth: Payer: Self-pay | Admitting: Gastroenterology

## 2014-09-26 NOTE — Telephone Encounter (Signed)
Dr Ardis Hughs, I am not familiar with coding practices. How would you like me to handle this?

## 2014-09-27 NOTE — Telephone Encounter (Signed)
No answer-did not leave a message.

## 2014-09-27 NOTE — Telephone Encounter (Signed)
Please let him know that we do not do "office endoscopy."  Our procedures are done in an "ambulatory surgical center."

## 2014-09-30 NOTE — Telephone Encounter (Signed)
Patient is aware 

## 2014-11-27 ENCOUNTER — Encounter: Payer: Self-pay | Admitting: Gastroenterology

## 2014-11-27 ENCOUNTER — Ambulatory Visit (AMBULATORY_SURGERY_CENTER): Payer: BLUE CROSS/BLUE SHIELD | Admitting: Gastroenterology

## 2014-11-27 VITALS — BP 117/65 | HR 54 | Temp 97.5°F | Resp 23 | Ht 75.0 in | Wt 290.0 lb

## 2014-11-27 DIAGNOSIS — D124 Benign neoplasm of descending colon: Secondary | ICD-10-CM | POA: Diagnosis not present

## 2014-11-27 DIAGNOSIS — D123 Benign neoplasm of transverse colon: Secondary | ICD-10-CM | POA: Diagnosis not present

## 2014-11-27 DIAGNOSIS — Z1211 Encounter for screening for malignant neoplasm of colon: Secondary | ICD-10-CM

## 2014-11-27 DIAGNOSIS — D122 Benign neoplasm of ascending colon: Secondary | ICD-10-CM | POA: Diagnosis not present

## 2014-11-27 MED ORDER — SODIUM CHLORIDE 0.9 % IV SOLN: 500.0000 mL | INTRAVENOUS | Status: DC

## 2014-11-27 NOTE — Progress Notes (Signed)
Report to PACU, RN, vss, BBS= Clear.  

## 2014-11-27 NOTE — Patient Instructions (Signed)
YOU HAD AN ENDOSCOPIC PROCEDURE TODAY AT [facility]:   Refer to the procedure report that was given to you for any specific questions about what was found during the examination.  If the procedure report does not answer your questions, please call your gastroenterologist to clarify.  If you requested that your care partner not be given the details of your procedure findings, then the procedure report has been included in a sealed envelope for you to review at your convenience later.  YOU SHOULD EXPECT: Some feelings of bloating in the abdomen. Passage of more gas than usual.  Walking can help get rid of the air that was put into your GI tract during the procedure and reduce the bloating. If you had a lower endoscopy (such as a colonoscopy or flexible sigmoidoscopy) you may notice spotting of blood in your stool or on the toilet paper. If you underwent a bowel prep for your procedure, you may not have a normal bowel movement for a few days.  Please Note:  You might notice some irritation and congestion in your nose or some drainage.  This is from the oxygen used during your procedure.  There is no need for concern and it should clear up in a day or so.  SYMPTOMS TO REPORT IMMEDIATELY:   Following lower endoscopy (colonoscopy or flexible sigmoidoscopy):  Excessive amounts of blood in the stool  Significant tenderness or worsening of abdominal pains  Swelling of the abdomen that is new, acute  Fever of 100F or higher  For urgent or emergent issues, a gastroenterologist can be reached at any hour by calling [phone].  DIET: Your first meal following the procedure should be a small meal and then it is ok to progress to your normal diet. Heavy or fried foods are harder to digest and may make you feel nauseous or bloated.  Likewise, meals heavy in dairy and vegetables can increase bloating.  Drink plenty of fluids but you should avoid alcoholic beverages for 24 hours.  ACTIVITY:   You should plan to take it easy for the rest of today and you should NOT DRIVE or use heavy machinery until tomorrow (because of the sedation medicines used during the test).    FOLLOW UP: Our staff will call the number listed on your records the next business day following your procedure to check on you and address any questions or concerns that you may have regarding the information given to you following your procedure. If we do not reach you, we will leave a message.  However, if you are feeling well and you are not experiencing any problems, there is no need to return our call.  We will assume that you have returned to your regular daily activities without incident.  If any biopsies were taken you will be contacted by phone or by letter within the next 1-3 weeks.  Please call us at [phone] if you have not heard about the biopsies in 3 weeks.   SIGNATURES/CONFIDENTIALITY: You and/or your care partner have signed paperwork which will be entered into your electronic medical record.  These signatures attest to the fact that that the information above on your After Visit Summary has been reviewed and is understood.  Full responsibility of the confidentiality of this discharge information lies with you and/or your care-partner.  Please continue your normal medications  Please read over handout about polyps

## 2014-11-27 NOTE — Progress Notes (Signed)
Called to room to assist during endoscopic procedure.  Patient ID and intended procedure confirmed with present staff. Received instructions for my participation in the procedure from the performing physician.  

## 2014-11-27 NOTE — Op Note (Signed)
[facility]  [address] [place], [address]   COLONOSCOPY PROCEDURE REPORT  PATIENT: Nathaniel Velazquez, Nathaniel Velazquez  MR#: [id number] BIRTHDATE: 1960-07-23 , 74  yrs. old GENDER: male ENDOSCOPIST: Milus Banister, MD REFERRED FG:[id number] Laney Pastor, M.D. PROCEDURE DATE:  11/27/2014 PROCEDURE:   Colonoscopy, screening and Colonoscopy with snare polypectomy First Screening Colonoscopy - Avg.  risk and is 50 yrs.  old or older Yes.  Prior Negative Screening - Now for repeat screening. N/A  History of Adenoma - Now for follow-up colonoscopy & has been > or = to 3 yrs.  N/A  Polyps Removed Today ASA CLASS:   Class II INDICATIONS:Screening for colonic neoplasia and Colorectal Neoplasm Risk Assessment for this procedure is average risk. MEDICATIONS: Monitored anesthesia care and Propofol 400 mg IV  DESCRIPTION OF PROCEDURE:   After the risks benefits and alternatives of the procedure were thoroughly explained, informed consent was obtained.  The digital rectal exam revealed no abnormalities of the rectum.   The LB [id number]  endoscope was introduced through the anus and advanced to the cecum, which was identified by both the appendix and ileocecal valve. No adverse events experienced.   The quality of the prep was excellent.  The instrument was then slowly withdrawn as the colon was fully examined.   COLON FINDINGS: Three sessile polyps ranging between 3-59mm in size were found in the descending colon, transverse colon, and ascending colon.  Polypectomies were performed with a cold snare.  The resection was complete, the polyp tissue was completely retrieved and sent to histology.   The examination was otherwise normal. Retroflexed views revealed no abnormalities. The time to cecum = 0.8 Withdrawal time = 11.4   The scope was withdrawn and the procedure completed. COMPLICATIONS: There were no immediate complications.  ENDOSCOPIC IMPRESSION: 1.  Three sessile polyps ranging  between 3-79mm in size were found in the descending colon, transverse colon, and ascending colon; polypectomies were performed with a cold snare 2.   The examination was otherwise normal  RECOMMENDATIONS: If the polyp(s) removed today are proven to be adenomatous (pre-cancerous) polyps, you will need a colonoscopy in 3-5 years. Otherwise you should continue to follow colorectal cancer screening guidelines for "routine risk" patients with a colonoscopy in 10 years.  You will receive a letter within 1-2 weeks with the results of your biopsy as well as final recommendations.  Please call my office if you have not received a letter after 3 weeks.  eSigned:  Milus Banister, MD 11/27/2014 9:20 AM

## 2014-11-28 ENCOUNTER — Telehealth: Payer: Self-pay | Admitting: *Deleted

## 2014-11-28 NOTE — Telephone Encounter (Signed)
No answer. Name identifier. Message left to call if questions or concerns. 

## 2014-12-17 ENCOUNTER — Telehealth: Payer: Self-pay | Admitting: Gastroenterology

## 2014-12-17 NOTE — Telephone Encounter (Signed)
Patty, please call him.  Please apologize for the delay in communicating results from his colonoscopy.   His polyps were the typical adenomatous polyps we remove. He needs repeat colonoscopy in 3 years.  Santiago Glad, no result note is needed. (the results have still never come back to my in box and so I cannot write a typical results note or results letter)

## 2014-12-18 NOTE — Telephone Encounter (Signed)
Pt has been notified.

## 2014-12-18 NOTE — Telephone Encounter (Signed)
Left message on machine to call back  

## 2015-02-20 ENCOUNTER — Other Ambulatory Visit: Payer: Self-pay | Admitting: Internal Medicine

## 2015-03-14 ENCOUNTER — Other Ambulatory Visit: Payer: Self-pay | Admitting: Internal Medicine

## 2015-03-14 NOTE — Telephone Encounter (Signed)
Pt called because he only received a 30 day supply of the TOPROL, when typically he gets a 90 day supply since this is cheaper. He wants to know if this can be corrected, or does he need to RTC for an OV?

## 2015-03-17 ENCOUNTER — Telehealth: Payer: Self-pay | Admitting: *Deleted

## 2015-03-17 NOTE — Telephone Encounter (Signed)
Spoke with patient needs a follow up visit refilled for 90 days.

## 2015-04-23 ENCOUNTER — Ambulatory Visit (INDEPENDENT_AMBULATORY_CARE_PROVIDER_SITE_OTHER): Payer: BLUE CROSS/BLUE SHIELD | Admitting: Internal Medicine

## 2015-04-23 ENCOUNTER — Encounter: Payer: Self-pay | Admitting: Internal Medicine

## 2015-04-23 VITALS — BP 141/83 | HR 72 | Temp 98.4°F | Resp 16 | Ht 74.5 in | Wt 286.2 lb

## 2015-04-23 DIAGNOSIS — Z23 Encounter for immunization: Secondary | ICD-10-CM | POA: Diagnosis not present

## 2015-04-23 DIAGNOSIS — R7989 Other specified abnormal findings of blood chemistry: Secondary | ICD-10-CM | POA: Diagnosis not present

## 2015-04-23 DIAGNOSIS — K6289 Other specified diseases of anus and rectum: Secondary | ICD-10-CM

## 2015-04-23 DIAGNOSIS — R945 Abnormal results of liver function studies: Secondary | ICD-10-CM

## 2015-04-23 DIAGNOSIS — I10 Essential (primary) hypertension: Secondary | ICD-10-CM

## 2015-04-23 LAB — CBC
HEMATOCRIT: 46 % (ref 39.0–52.0)
HEMOGLOBIN: 16.2 g/dL (ref 13.0–17.0)
MCH: 31.4 pg (ref 26.0–34.0)
MCHC: 35.2 g/dL (ref 30.0–36.0)
MCV: 89.1 fL (ref 78.0–100.0)
MPV: 10.3 fL (ref 8.6–12.4)
Platelets: 260 10*3/uL (ref 150–400)
RBC: 5.16 MIL/uL (ref 4.22–5.81)
RDW: 13 % (ref 11.5–15.5)
WBC: 7.1 10*3/uL (ref 4.0–10.5)

## 2015-04-23 LAB — COMPREHENSIVE METABOLIC PANEL
ALT: 87 U/L — ABNORMAL HIGH (ref 9–46)
AST: 47 U/L — ABNORMAL HIGH (ref 10–35)
Albumin: 4.8 g/dL (ref 3.6–5.1)
Alkaline Phosphatase: 68 U/L (ref 40–115)
BUN: 18 mg/dL (ref 7–25)
CALCIUM: 9.8 mg/dL (ref 8.6–10.3)
CO2: 24 mmol/L (ref 20–31)
Chloride: 102 mmol/L (ref 98–110)
Creat: 1.11 mg/dL (ref 0.70–1.33)
GLUCOSE: 101 mg/dL — AB (ref 65–99)
POTASSIUM: 4.9 mmol/L (ref 3.5–5.3)
Sodium: 136 mmol/L (ref 135–146)
Total Bilirubin: 0.7 mg/dL (ref 0.2–1.2)
Total Protein: 7.4 g/dL (ref 6.1–8.1)

## 2015-04-23 LAB — TSH: TSH: 1.308 u[IU]/mL (ref 0.350–4.500)

## 2015-04-23 LAB — LIPID PANEL
CHOL/HDL RATIO: 3.4 ratio (ref <=5.0)
Cholesterol: 169 mg/dL (ref 125–200)
HDL: 49 mg/dL (ref >=40)
LDL CALC: 87 mg/dL (ref <130)
Triglycerides: 166 mg/dL — ABNORMAL HIGH (ref <150)
VLDL: 33 mg/dL — ABNORMAL HIGH (ref <30)

## 2015-04-23 MED ORDER — LISINOPRIL 10 MG PO TABS: ORAL_TABLET | ORAL | Status: DC

## 2015-04-23 MED ORDER — METOPROLOL SUCCINATE ER 25 MG PO TB24: ORAL_TABLET | ORAL | Status: DC

## 2015-04-23 MED ORDER — HYDROCORTISONE ACETATE 25 MG RE SUPP: 25.0000 mg | Freq: Two times a day (BID) | RECTAL | Status: DC

## 2015-04-23 NOTE — Progress Notes (Signed)
   Subjective:    Patient ID: Nathaniel Velazquez, male    DOB: 06/16/1961, 54 y.o.   MRN: [id number]  HPI This is a pleasant 54 yo male who presents today for follow up of HTN, hyperlipidemia, elevated LFTs, rectal pain. He was last seen 03/2014. He had a screening colonoscopy which found adenomatous polyps- 67yr recall.   He had knee surgery in the spring and has not been satisfied with result- continues to have swelling and pain. He has been having problems with rectal pain which has made it more difficult to ride the exercise bike. Takes ibuprofen 200 mg- 3 tablets daily. Has had had rectal pain, no bleeding. No relief with Preparation H. No external bulging.   Continues to have eczema on hands, worse fall/winter. Uses clobetesol. Sees dermatology.   Review of Systems No chest pain or SOB, no edema, no headaches    Objective:   Physical Exam Physical Exam  Constitutional: Oriented to person, place, and time. He appears well-developed and well-nourished.  HENT:  Head: Normocephalic and atraumatic.  Eyes: Conjunctivae are normal.  Neck: Normal range of motion. Neck supple.  Cardiovascular: Normal rate, regular rhythm and normal heart sounds.   Pulmonary/Chest: Effort normal and breath sounds normal.  Musculoskeletal: Normal range of motion.  Neurological: Alert and oriented to person, place, and time.  Skin: Skin is warm and dry. Hands with some peeling on palms and distal fingers.  Psychiatric: Normal mood and affect. Behavior is normal. Judgment and thought content normal.  He declined rectal exam.   Vitals reviewed. BP 141/83 mmHg  Pulse 72  Temp(Src) 98.4 F (36.9 C) (Oral)  Resp 16  Ht 6' 2.5" (1.892 m)  Wt 286 lb 3.2 oz (129.819 kg)  BMI 36.27 kg/m2 Wt Readings from Last 3 Encounters:  04/23/15 286 lb 3.2 oz (129.819 kg)  11/27/14 290 lb (131.543 kg)  08/30/14 290 lb (131.543 kg)      Assessment & Plan:  1. Essential hypertension - CBC - Lipid panel - TSH -  Comprehensive metabolic panel - Hemoglobin A1c - lisinopril (PRINIVIL,ZESTRIL) 10 MG tablet; TAKE 1 BY MOUTH DAILY  Dispense: 90 tablet; Refill: 3 - metoprolol succinate (TOPROL-XL) 25 MG 24 hr tablet; TAKE 1 BY MOUTH DAILY. Dispense: 90 tablet; Refill: 3  2. Abnormal LFTs - Comprehensive metabolic panel  3. Need for prophylactic vaccination and inoculation against influenza - Flu Vaccine QUAD 36+ mos IM  4. Rectal pain - suspect fissure - hydrocortisone (ANUSOL-HC) 25 MG suppository; Place 1 suppository (25 mg total) rectally 2 (two) times daily.  Dispense: 12 suppository; Refill: 0 - he will let me know if this helps his pain  5. Obesity - discussed barriers to healthy food choices and ways to overcome these, encouraged exercise and portion control.   - follow up 6 months  [name], FNP-BC  [facility], [facility]  04/23/2015 5:40 PM

## 2015-04-24 LAB — HEMOGLOBIN A1C
HEMOGLOBIN A1C: 5.6 % (ref <5.7)
Mean Plasma Glucose: 114 mg/dL (ref <117)

## 2015-05-06 ENCOUNTER — Telehealth: Payer: Self-pay | Admitting: Family Medicine

## 2015-05-06 NOTE — Telephone Encounter (Signed)
Spoke with pt about his new appt time and sent letter

## 2015-05-26 ENCOUNTER — Encounter: Payer: Self-pay | Admitting: Family Medicine

## 2015-05-26 ENCOUNTER — Telehealth: Payer: Self-pay

## 2015-05-26 ENCOUNTER — Ambulatory Visit (INDEPENDENT_AMBULATORY_CARE_PROVIDER_SITE_OTHER): Payer: BLUE CROSS/BLUE SHIELD | Admitting: Family Medicine

## 2015-05-26 VITALS — BP 138/86 | HR 63 | Temp 97.3°F | Resp 16 | Ht 74.5 in | Wt 286.0 lb

## 2015-05-26 DIAGNOSIS — R21 Rash and other nonspecific skin eruption: Secondary | ICD-10-CM

## 2015-05-26 DIAGNOSIS — K602 Anal fissure, unspecified: Secondary | ICD-10-CM | POA: Diagnosis not present

## 2015-05-26 MED ORDER — DILTIAZEM GEL 2 %: 1.0000 "application " | Freq: Two times a day (BID) | CUTANEOUS | Status: DC

## 2015-05-26 MED ORDER — NYSTATIN-TRIAMCINOLONE 100000-0.1 UNIT/GM-% EX OINT: 1.0000 "application " | TOPICAL_OINTMENT | Freq: Two times a day (BID) | CUTANEOUS | Status: DC

## 2015-05-26 NOTE — Telephone Encounter (Signed)
It is safe for him to take Colace daily as needed for a stool softener. It should not interfere with his other medications. It is important to drink a lot of water for the colace to be effective.

## 2015-05-26 NOTE — Telephone Encounter (Signed)
Pt advised and understood. 

## 2015-05-26 NOTE — Patient Instructions (Signed)
Anal Fissure, Adult °An anal fissure is a small tear or crack in the skin around the anus. Bleeding from a fissure usually stops on its own within a few minutes. However, bleeding will often occur again with each bowel movement until the crack heals. °CAUSES °This condition may be caused by: °· Passing large, hard stool (feces). °· Frequent diarrhea. °· Constipation. °· Inflammatory bowel disease (Crohn disease or ulcerative colitis). °· Infections. °· Anal sex. °SYMPTOMS °Symptoms of this condition include: °· Bleeding from the rectum. °· Small amounts of blood seen on your stool, on toilet paper, or in the toilet after a bowel movement. °· Painful bowel movements. °· Itching or irritation around the anus. °DIAGNOSIS  °A health care provider may diagnose this condition by closely examining the anal area. An anal fissure can usually be seen with careful inspection. In some cases, a rectal exam may be performed, or a short tube (anoscope) may be used to examine the anal canal. °TREATMENT °Treatment for this condition may include: °· Taking steps to avoid constipation. This may include making changes to your diet, such as increasing your intake of fiber or fluid. °· Taking fiber supplements. These supplements can soften your stool to help make bowel movements easier. Your health care provider may also prescribe a stool softener if your stool is often hard. °· Taking sitz baths. This may help to heal the tear. °· Using medicated creams or ointments. These may be prescribed to lessen discomfort. °HOME CARE INSTRUCTIONS °Eating and Drinking °· Avoid foods that may be constipating, such as bananas and dairy products. °· Drink enough fluid to keep your urine clear or pale yellow. °· Maintain a diet that is high in fruits, whole grains, and vegetables. °General Instructions °· Keep the anal area as clean and dry as possible. °· Take sitz baths as told by your health care provider. Do not use soap in the sitz baths. °· Take  over-the-counter and prescription medicines only as told by your health care provider. °· Use creams or ointments only as told by your health care provider. °· Keep all follow-up visits as told by your health care provider. This is important. °SEEK MEDICAL CARE IF: °· You have more bleeding. °· You have a fever. °· You have diarrhea that is mixed with blood. °· You continue to have pain. °· Your problem is getting worse rather than better. °  °This information is not intended to replace advice given to you by your health care provider. Make sure you discuss any questions you have with your health care provider. °  °Document Released: 07/05/2005 Document Revised: 03/26/2015 Document Reviewed: 09/30/2014 °Elsevier Interactive Patient Education ©2016 [employer] ° °

## 2015-05-26 NOTE — Progress Notes (Addendum)
   Subjective:    Patient ID: Nathaniel Velazquez, male    DOB: 06-14-1961, 54 y.o.   MRN: [id number]  HPI Patient presents today for follow up of hemorrhoids. He has recently been working at the [employer] and has been having pain for several weeks. He has tried a high fiber diet which resulted in large number of loose stools which exacerbated his symptoms. He did not have relief with hydrocortisone suppositories. He has pain in his rectum, "feels like a knife". This is worse with bowel movements. No significant bulging, rare bleeding. He has a history of anal fistulectomy many years ago. He feels like it is difficult to get clean following bowel movements and he frequently takes a bath or uses wipes. Had screening colonoscopy 5/16. Rectum normal. Few small polyps removed.   He has a chronic rash under left axilla. He was told by his dermatologist that is is yeast and suggested he mix OTC antifungal cream with OTC steroid cream. He has done this with some relief but not complete clearing.   Knee is finally doing better following surgery. He continues to take ibuprofen 400-600 mg BID-TID. He is trying to walk more and watch his diet.   Review of Systems Per HPI    Objective:   Physical Exam  Constitutional: He is oriented to person, place, and time. He appears well-developed and well-nourished.  HENT:  Head: Normocephalic and atraumatic.  Eyes: Conjunctivae are normal.  Neck: Normal range of motion. Neck supple.  Cardiovascular: Normal rate and regular rhythm.   Pulmonary/Chest: Effort normal.  Genitourinary:  Rectum with some erythema and surrounding skin moist with erythema.   Musculoskeletal: Normal range of motion.  Neurological: He is alert and oriented to person, place, and time.  Skin: Skin is warm and dry.  Left axilla with with approximately 5-6 cm area erythema. Slightly moist.   Psychiatric: He has a normal mood and affect. His behavior is normal. Judgment and  thought content normal.  Vitals reviewed. BP recheck 138/86 BP 152/83 mmHg  Pulse 63  Temp(Src) 97.3 F (36.3 C)  Resp 16  Ht 6' 2.5" (1.892 m)  Wt 286 lb (129.729 kg)  BMI 36.24 kg/m2  Wt Readings from Last 3 Encounters:  05/26/15 286 lb (129.729 kg)  04/23/15 286 lb 3.2 oz (129.819 kg)  11/27/14 290 lb (131.543 kg)      Assessment & Plan:  1. Anal fissure - Provided written and verbal information regarding diagnosis and treatment. - diltiazem 2 % GEL; Apply 1 application topically 2 (two) times daily.  Dispense: 30 g; Refill: 1 - suggested probiotic to help regulate bowel movements - if no improvement in 2 weeks, follow up with Dr. Ardis Hughs  2. Rash and nonspecific skin eruption - nystatin-triamcinolone ointment (MYCOLOG); Apply 1 application topically 2 (two) times daily. Apply sparingly to affected areas for up to 7 days  Dispense: 30 g; Refill: 0  3. Essential HTN - repeat BP in office WNL and patient reports his home readings are in the 120s/80s range consistently.  - continue lisinopril 10 mg - follow up 6 months.   [name], FNP-BC  [facility]  05/26/2015 8:49 AM

## 2015-05-26 NOTE — Telephone Encounter (Signed)
Pt just saw debbie gessner today and has another question can he take 100mg  of colace once or twice a day along with the other medications and for how long can he take this

## 2015-06-04 ENCOUNTER — Telehealth: Payer: Self-pay

## 2015-06-04 ENCOUNTER — Telehealth: Payer: Self-pay | Admitting: Gastroenterology

## 2015-06-04 NOTE — Telephone Encounter (Signed)
Pt states that  He would like to let gessner know that symtoms are the same with no relief would like to go ahead with referral

## 2015-06-04 NOTE — Telephone Encounter (Signed)
Pt notified. He has already put in a call to Dr. Ardis Hughs.  Advised if he cannot get him in ASAP to call us back and we will call.

## 2015-06-04 NOTE — Telephone Encounter (Signed)
The referral will be to GI. He has seen Dr. Ardis Hughs at [facility] recently and he can call make an appointment or we can.

## 2015-06-04 NOTE — Telephone Encounter (Signed)
Pt states he has been having rectal pain for about 2 mths. States he has tried suppositories and nothing has helped. Pt requests to be seen. Pt scheduled to see Alonza Bogus PA tomorrow at 8:30am. Pt aware of appt.

## 2015-06-05 ENCOUNTER — Telehealth: Payer: Self-pay | Admitting: Gastroenterology

## 2015-06-05 ENCOUNTER — Ambulatory Visit (INDEPENDENT_AMBULATORY_CARE_PROVIDER_SITE_OTHER): Payer: BLUE CROSS/BLUE SHIELD | Admitting: Gastroenterology

## 2015-06-05 ENCOUNTER — Ambulatory Visit (INDEPENDENT_AMBULATORY_CARE_PROVIDER_SITE_OTHER)
Admission: RE | Admit: 2015-06-05 | Discharge: 2015-06-05 | Disposition: A | Discharge status: Home / Self Care | Payer: BLUE CROSS/BLUE SHIELD | Source: Ambulatory Visit | Attending: Gastroenterology | Admitting: Gastroenterology

## 2015-06-05 ENCOUNTER — Telehealth: Payer: Self-pay | Admitting: *Deleted

## 2015-06-05 ENCOUNTER — Encounter: Payer: Self-pay | Admitting: Gastroenterology

## 2015-06-05 VITALS — BP 130/70 | HR 84 | Ht 74.5 in | Wt 280.8 lb

## 2015-06-05 DIAGNOSIS — K6289 Other specified diseases of anus and rectum: Secondary | ICD-10-CM

## 2015-06-05 MED ORDER — DICYCLOMINE HCL 20 MG PO TABS: ORAL_TABLET | ORAL | Status: DC

## 2015-06-05 MED ORDER — CIPROFLOXACIN HCL 500 MG PO TABS: 500.0000 mg | ORAL_TABLET | Freq: Two times a day (BID) | ORAL | Status: DC

## 2015-06-05 MED ORDER — IOHEXOL 300 MG/ML  SOLN
80.0000 mL | Freq: Once | INTRAMUSCULAR | Status: AC | PRN
  Administered 2015-06-05: 80 mL via INTRAVENOUS

## 2015-06-05 MED ORDER — METRONIDAZOLE 500 MG PO TABS: 500.0000 mg | ORAL_TABLET | Freq: Three times a day (TID) | ORAL | Status: DC

## 2015-06-05 NOTE — Progress Notes (Signed)
     06/05/2015 Nathaniel Velazquez [id number] 1961/04/22   History of Present Illness:  This is a 54 year old male who is known to Dr. Ardis Hughs for colonoscopy in May 2016 at which time he was found to have 3 tubular adenomas that were removed; recall recommended in 3 years from that time.  He presents to our office today with complaints of rectal/anal pain.  This has been present since September but worsening with severe pain for the past one month.  Cannot even sit.  Has tried hydrocortisone suppositories without relief and has been using diltiazem gel for the past 9 days or so.  Has severe spasm that last for a couple of hours after a BM.  Denies fever or chills.  No abdominal pain, but feels shooting pain in his rectum and to his spine at times.    Has history of anal fistulectomy 30 years ago for a peri-anal abscess of some sort that required surgery; unknown cause of this.   Current Medications, Allergies, Past Medical History, Past Surgical History, Family History and Social History were reviewed in [employer] record.   Physical Exam: BP 130/70 mmHg  Pulse 84  Ht 6' 2.5" (1.892 m)  Wt 280 lb 12.8 oz (127.37 kg)  BMI 35.58 kg/m2 General: Well developed white male in no acute distress Head: Normocephalic and atraumatic Eyes:  Sclerae anicteric, conjunctiva pink  Ears: Normal auditory acuity Abdomen: Soft, non-distended.  Normal bowel sounds.  Non-tender.   Rectal:  Peri-anal excoriation seen on both the left and the right.  Exquisitely tender on exam so that DRE and anoscopy could not be performed. Musculoskeletal: Symmetrical with no gross deformities  Extremities: No edema  Neurological: Alert oriented x 4, grossly non-focal Psychological:  Alert and cooperative. Normal mood and affect  Assessment and Recommendations: -Rectal pain:  Has some external excoriation and is extremely tender.  No DRE or anoscopy could be performed.  Will check CT scan of the pelvis  to rule out peri-rectal/peri-anal abscess.  Will start antibiotics in the form of cipro 500 mg BID for 10 days and flagyl 500 mg TID for 10 days.  Discontinue diltiazem and hydrocortisone suppositories.  Will apply zinc oxide several times per day.

## 2015-06-05 NOTE — Telephone Encounter (Signed)
Per Alonza Bogus, PA may have dicyclomine 20 mg TID prn spasms #30. Patient aware and rx sent.

## 2015-06-05 NOTE — Progress Notes (Signed)
i agree with the above note, plan 

## 2015-06-05 NOTE — Telephone Encounter (Signed)
Patient is calling for results. Please, advise. 

## 2015-06-05 NOTE — Patient Instructions (Signed)
We sent a prescription for Flagyl 500 mg 3 times daily x 10 days. [facility] [address] [employer]  Aslo sent Cipro 500 mg twice daily x 10 days.  Stop the diltiazem gel and suppositories. Apply Desitin ( Zinc oxide)  Multiple times daily.   You have been scheduled for a CT scan of the abdomen and pelvis at [facility] ([address]---this is in the same building as Press [facility]).   You are scheduled on 06-05-2015 at 1:15 PM. You should arrive at 1:00 PM prior to your appointment time for registration. Please follow the written instructions below on the day of your exam:  WARNING: IF YOU ARE ALLERGIC TO IODINE/X-RAY DYE, PLEASE NOTIFY RADIOLOGY IMMEDIATELY AT [phone]! YOU WILL BE GIVEN A 13 HOUR PREMEDICATION PREP.  1) Do not eat  anything after  9:30 am  (4 hours prior to your test) 2) You have been given 2 bottles of oral contrast to drink. The solution may taste better if refrigerated, but do NOT add ice or any other liquid to this solution. Shake well before drinking.    Drink 1 bottle of contrast @ 11:15 am (2 hours prior to your exam)  Drink 1/2  bottle of contrast @ 12:15 PM (1 hour prior to your exam)  You may take any medications as prescribed with a small amount of water except for the following: Metformin, Glucophage, Glucovance, Avandamet, Riomet, Fortamet, Actoplus Met, Janumet, Glumetza or Metaglip. The above medications must be held the day of the exam AND 48 hours after the exam.  The purpose of you drinking the oral contrast is to aid in the visualization of your intestinal tract. The contrast solution may cause some diarrhea. Before your exam is started, you will be given a small amount of fluid to drink. Depending on your individual set of symptoms, you may also receive an intravenous injection of x-ray contrast/dye. Plan on being at [facility] for 30 minutes or long, depending on the type of exam you are having performed.  If you  have any questions regarding your exam or if you need to reschedule, you may call the CT department at [phone] between the hours of 8:00 am and 5:00 pm, Monday-Friday.  ________________________________________________________________________

## 2015-06-11 MED ORDER — METRONIDAZOLE 500 MG PO TABS: 500.0000 mg | ORAL_TABLET | Freq: Three times a day (TID) | ORAL | Status: DC

## 2015-06-11 MED ORDER — CIPROFLOXACIN HCL 500 MG PO TABS: 500.0000 mg | ORAL_TABLET | Freq: Two times a day (BID) | ORAL | Status: DC

## 2015-06-11 NOTE — Telephone Encounter (Signed)
Pt states that the pain level has gone from at 5 to a 3. States he has improved but he does have the one spot that is still quite bothersome. Alonza Bogus PA notified of update.

## 2015-06-11 NOTE — Telephone Encounter (Signed)
Please have the patient completed course of antibiotics. I believe we gave him 10 days of antibiotics but would like him to take a 14 day course if we can please send prescriptions for 4 more days of the medication. Follow up with [name] on 12/5 as discussed for thorough reexamination.  Thank you,  Jess

## 2015-06-11 NOTE — Telephone Encounter (Signed)
Spoke with pt and he is aware. Pt scheduled to see [name] Hvozdovic, PA-C 06/23/15@2 :45pm. Script sent to pharmacy.

## 2015-06-16 ENCOUNTER — Encounter: Payer: Self-pay | Admitting: Internal Medicine

## 2015-06-17 ENCOUNTER — Telehealth: Payer: Self-pay | Admitting: Gastroenterology

## 2015-06-17 NOTE — Telephone Encounter (Signed)
We can have him try lidocaine gel (OTC Recticare) several times per day for discomfort, but I am hesitant to have him use hydrocortisone, etc until he is seen back and re-examined thoroughly.  Can try some Ibuprofen orally a couple of times per day to see if that helps for the next few days.  Thank you,  Jess

## 2015-06-17 NOTE — Telephone Encounter (Signed)
Spoke with patient and he states he is still having rectal pain. He is doing better but still has one spot that is sore. He has 2 days left of antibiotics. 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 Dicyclomine has helped his bowels. He is using Desitin. He is asking what else he can do until OV on 06/23/15. Please, advise.

## 2015-06-18 NOTE — Telephone Encounter (Signed)
Patient given recommendations. 

## 2015-06-23 ENCOUNTER — Ambulatory Visit (INDEPENDENT_AMBULATORY_CARE_PROVIDER_SITE_OTHER): Payer: BLUE CROSS/BLUE SHIELD | Admitting: Physician Assistant

## 2015-06-23 ENCOUNTER — Encounter: Payer: Self-pay | Admitting: Physician Assistant

## 2015-06-23 VITALS — BP 138/70 | HR 78 | Wt 281.0 lb

## 2015-06-23 DIAGNOSIS — K6289 Other specified diseases of anus and rectum: Secondary | ICD-10-CM

## 2015-06-23 MED ORDER — DICYCLOMINE HCL 20 MG PO TABS: 20.0000 mg | ORAL_TABLET | Freq: Three times a day (TID) | ORAL | Status: DC

## 2015-06-23 NOTE — Patient Instructions (Signed)
You have been scheduled for an appointment at [facility]. Your appointment is on 06-25-2015 at 1:45pm. Please arrive at 1:30pm for registration. Make certain to bring a list of current medications, including any over the counter medications or vitamins. Also bring your co-pay if you have one as well as your insurance cards. [facility] is located at [address], [address]. Should you need to reschedule your appointment, please contact them at [phone].   We have sent the following medications to your pharmacy for you to pick up at your convenience. Bentyl  Use your Diltiazem ointment 2% twice a day for 1 week.  You can apply [place] diaper rash ointment to perirectal area.

## 2015-06-23 NOTE — Progress Notes (Signed)
Patient ID: Nathaniel Velazquez, male   DOB: 10-Feb-1961, 54 y.o.   MRN: [id number]     History of Present Illness: Nathaniel Velazquez is a 53 year old male who is known to Dr. Ardis Hughs. He had a colonoscopy in May 2006 at which time he was found to have 3 tubular adenomas that were removed. He was advised to have surveillance in 3 years. He was seen in the office by Alonza Bogus, PA, on November 17 with complaints of rectal/anal pain that had been present since September. He was unable to even sit. He had tried hydrocortisone suppositories and diltiazem gel with little relief. He was complaining of severe spasms that lasted for several hours after each bowel movement. He has a history of an anal fistulectomy 30 years ago for perianal abscess of some sort that required surgery. At his last exam he was noted to have perianal excoriation on both the left and right exam was exquisitely tender so that a digital rectal exam and anoscopy could not be performed. He was sent for a CT of the pelvis to rule out perirectal/perianal abscess. The test was nonrevealing for the above. He was empirically treated with Cipro 500 mg twice a day and Flagyl 500 mg 3 times a day for 14 days. He was advised to using oxide several times daily. He was also advised to use Bentyl 20 mg 3 times a day. He states on the Bentyl he had no further rectal spasm. He began to feel better but once he ran out of Bentyl and completed the antibiotics his pain came back. He has been missing work because his rectal pain is so severe. He states if he has to sit for a long period he cannot take the pain. He is moving his bowels several times per day. He states his stools are soft and he is not straining. He has not had any rectal bleeding.    Past Medical History  Diagnosis Date  . Hypertension   . Obesity     Steroid injection in rt knee  . GERD (gastroesophageal reflux disease)     Past Surgical History  Procedure Laterality Date  . Appendectomy    .  Anal fistulectomy  1987  . Tonsillectomy     Family History  Problem Relation Age of Onset  . Hypertrophic cardiomyopathy Brother   . Heart disease Mother   . Hypertension Mother   . Heart disease Father   . Hypertension Father   . Obesity Brother   . Heart disease Brother   . Colon cancer Neg Hx    Social History  Substance Use Topics  . Smoking status: Never Smoker   . Smokeless tobacco: Never Used  . Alcohol Use: 4.2 oz/week    7 Standard drinks or equivalent per week   Current Outpatient Prescriptions  Medication Sig Dispense Refill  . clobetasol ointment (TEMOVATE) 0.05 % As directed    . dicyclomine (BENTYL) 20 MG tablet Take one po TID prn for spasms 30 tablet 0  . Ibuprofen (ADVIL PO) Take by mouth daily.    Marland Kitchen lisinopril (PRINIVIL,ZESTRIL) 10 MG tablet TAKE 1 BY MOUTH DAILY 90 tablet 3  . metoprolol succinate (TOPROL-XL) 25 MG 24 hr tablet TAKE 1 BY MOUTH DAILY.  "NO MORE REFILLS WITHOUT OV" 90 tablet 3   No current facility-administered medications for this visit.   Allergies  Allergen Reactions  . Gabapentin      Review of Systems: Per history of present illness otherwise negative.  Studies:   Ct Pelvis W Contrast  05-Jul-2015  CLINICAL DATA:  Worsening rectal pain for past several months. EXAM: CT PELVIS WITH CONTRAST TECHNIQUE: Multidetector CT imaging of the pelvis was performed using the standard protocol following the bolus administration of intravenous contrast. CONTRAST:  74mL OMNIPAQUE IOHEXOL 300 MG/ML  SOLN COMPARISON:  None. FINDINGS: No evidence of abscess or inflammatory process in the perianal or perirectal regions. Visualized pelvic bowel loops are unremarkable in appearance. No other pelvic inflammatory process or abnormal fluid collections identified. No evidence of pelvic soft tissue masses or lymphadenopathy. 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 IMPRESSION: No acute findings within the  pelvis. No evidence of perirectal or perianal abscess. 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 Consider abdomen CT for further evaluation. Electronically Signed   By: Earle Gell M.D.   On: Jul 05, 2015 13:48     Physical Exam: BP 138/70 mmHg  Pulse 78  Wt 281 lb (127.461 kg) General: Pleasant, well developed , Caucasian male in no acute distress Head: Normocephalic and atraumatic Eyes:  sclerae anicteric, conjunctiva pink  Ears: Normal auditory acuity Lungs: Clear throughout to auscultation Heart: Regular rate and rhythm Abdomen: Soft, non distended, non-tender. No masses, no hepatomegaly. Normal bowel sounds Rectal: Perianal excoriation and maceration noted on the left with no surrounding erythema. The area externally is nontender to palpation. The anus is exquisitely tender and again digital rectal exam and anoscopy unable to be performed due to exquisite pain. Musculoskeletal: Symmetrical with no gross deformities  Extremities: No edema  Neurological: Alert oriented x 4, grossly nonfocal Psychological:  Alert and cooperative. Normal mood and affect  Assessment and Recommendations: 54 year old male with a history of anal fissures and fistulectomy 30 years ago for perianal abscess, here with ongoing severe rectal pain. Pelvic CT had no acute findings in the pelvis. Initially had some relief with Cipro and Flagyl along with Bentyl but once he completed this is his pain is back. He states his pain is quite severe and he has constant throbbing in the rectum. 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 Patient has been encouraged to use Bentyl 20 mg by mouth 3 times a day. He will restart diltiazem ointment 2% rectally twice a day. He will use Desitin diaper rash ointment to the perirectal area former barrier. He will be scheduled for evaluation with [facility] as he will likely need an exam under anesthesia.        Gratia Disla, Deloris Ping 06/23/2015,  CC: [facility]

## 2015-06-24 ENCOUNTER — Ambulatory Visit (INDEPENDENT_AMBULATORY_CARE_PROVIDER_SITE_OTHER): Payer: BLUE CROSS/BLUE SHIELD | Admitting: Family Medicine

## 2015-06-24 VITALS — BP 144/86 | HR 79 | Temp 97.9°F | Resp 16 | Ht 74.5 in | Wt 277.8 lb

## 2015-06-24 DIAGNOSIS — S0181XA Laceration without foreign body of other part of head, initial encounter: Secondary | ICD-10-CM | POA: Diagnosis not present

## 2015-06-24 DIAGNOSIS — W540XXA Bitten by dog, initial encounter: Secondary | ICD-10-CM | POA: Diagnosis not present

## 2015-06-24 MED ORDER — DOXYCYCLINE HYCLATE 100 MG PO TABS: 100.0000 mg | ORAL_TABLET | Freq: Two times a day (BID) | ORAL | Status: DC

## 2015-06-24 NOTE — Patient Instructions (Signed)
You may get wound wet to get a shower. Try not to shave around it.  Return on Sunday for suture removal

## 2015-06-24 NOTE — Progress Notes (Signed)
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 Dog bite was on his left jaw. Patient thinks that his tetanus status is up-to-date.  Patient is currently suffering from anal fissure as well. He was to avoid medicine; diarrhea.  Objective: Alert and cooperative BP 144/86 mmHg  Pulse 79  Temp(Src) 97.9 F (36.6 C) (Oral)  Resp 16  Ht 6' 2.5" (1.892 m)  Wt 277 lb 12.8 oz (126.009 kg)  BMI 35.20 kg/m2  SpO2 97% Patient has 1 cm gaping left jaw laceration and a very small puncture wound inferior to that.  After informed consent, the area was washed with soap and water, then cleansed with Betadine. He was anesthetized with 1% Xylocaine and epi. The wound was closed with 3 times 6-0 Ethilon. No complications.  Assessment: Small laceration of the face from dog bite  Plan: Return in 5 days for suture removal Doxycycline 100 mg twice a day in the meantime  Signed Robyn Haber M.D.

## 2015-06-24 NOTE — Progress Notes (Signed)
i agree with the above note, plan 

## 2015-06-30 ENCOUNTER — Other Ambulatory Visit: Payer: Self-pay | Admitting: General Surgery

## 2015-06-30 ENCOUNTER — Ambulatory Visit (INDEPENDENT_AMBULATORY_CARE_PROVIDER_SITE_OTHER): Payer: BLUE CROSS/BLUE SHIELD | Admitting: Physician Assistant

## 2015-06-30 VITALS — BP 126/84 | HR 81 | Temp 97.6°F | Resp 16 | Ht 74.5 in

## 2015-06-30 DIAGNOSIS — T148 Other injury of unspecified body region: Secondary | ICD-10-CM

## 2015-06-30 DIAGNOSIS — Z4802 Encounter for removal of sutures: Secondary | ICD-10-CM

## 2015-06-30 DIAGNOSIS — W540XXA Bitten by dog, initial encounter: Secondary | ICD-10-CM

## 2015-06-30 DIAGNOSIS — S0180XD Unspecified open wound of other part of head, subsequent encounter: Secondary | ICD-10-CM

## 2015-06-30 NOTE — Progress Notes (Signed)
Chief Complaint  Patient presents with  . Suture / Staple Removal    under his chin    History of Present Illness: Patient presents for suture removal.  Sutures were used to close a laceration of the LEFT face/chin after a dog bite on 06/24/2015. He has done well, without problems/concerns.  No fever, chills, nausea, vomiting. No swelling, redness or drainage from the wound. No tenderness.   Allergies  Allergen Reactions  . Gabapentin     Prior to Admission medications   Medication Sig Start Date End Date Taking? Authorizing Provider  clobetasol ointment (TEMOVATE) 0.05 % As directed 05/15/12  Yes Historical Provider, MD  dicyclomine (BENTYL) 20 MG tablet Take 1 tablet (20 mg total) by mouth 3 (three) times daily. 06/23/15  Yes Lori P Hvozdovic, PA-C  Ibuprofen (ADVIL PO) Take by mouth daily.   Yes Historical Provider, MD  lisinopril (PRINIVIL,ZESTRIL) 10 MG tablet TAKE 1 BY MOUTH DAILY 04/23/15  Yes Elby Beck, FNP  metoprolol succinate (TOPROL-XL) 25 MG 24 hr tablet TAKE 1 BY MOUTH DAILY.  "NO MORE REFILLS WITHOUT OV" 04/23/15  Yes Elby Beck, FNP  dicyclomine (BENTYL) 20 MG tablet Take one po TID prn for spasms Patient not taking: Reported on 06/24/2015 06/05/15   Loralie Champagne, PA-C    Patient Active Problem List   Diagnosis Date Noted  . Rectal pain 06/05/2015  . Hypertension   . Obesity   . Hyperlipidemia 11/21/2008  . PALPITATIONS 10/30/2008     Physical Exam  Constitutional: He is oriented to person, place, and time. He appears well-developed and well-nourished. He is active and cooperative. No distress.  BP 126/84 mmHg  Pulse 81  Temp(Src) 97.6 F (36.4 C) (Oral)  Resp 16  Ht 6' 2.5" (1.892 m)  SpO2 98%   Eyes: Conjunctivae are normal.  Pulmonary/Chest: Effort normal.  Neurological: He is alert and oriented to person, place, and time.  Skin: Skin is warm and dry. Laceration (well-healed laceration just under the jaw on the LEFT. 2 simple  interrupted Ethilon sutures are intact.) noted.  Psychiatric: He has a normal mood and affect. His speech is normal and behavior is normal.   #2 SI 6-0 Ethilon sutures removed without incident. The notes indicate that #3 sutures were placed, but only #2 are present now.   ASSESSMENT & PLAN:  1. Visit for suture removal 2. Dog bite 3. Open wound, face, without complication, subsequent encounter Local wound care. Anticipatory guidance provided.   Fara Chute, PA-C Physician Assistant-Certified [facility]

## 2015-06-30 NOTE — Patient Instructions (Signed)
Continue washing daily with soap and water until completely healed. You may apply antibiotic ointment as desired, but it is not necessary.

## 2015-06-30 NOTE — H&P (Signed)
Nathaniel Velazquez. Yanda 06/30/2015 4:17 PM Location: [facility] Patient #: [id number] DOB: Feb 27, 1961 Married / Language: Nathaniel Velazquez / Race: White Male  History of Present Illness Nathaniel Ruff MD; Q000111Q 4:36 PM) Patient words: discuss EUA.  The patient is a 54 year old male who presents with anal pain. 54 year old male who presents with approximately 3 months of worsening anal pain. He states that his bowel movements are soft and he denies any major pain during bowel movement. He then developed severe cramping and pain approximately 20-30 minutes after bowel movements. This continues for several hours. It keeps him from being able to do his job, which requires a lot of travel. He denies any rectal bleeding. Colonoscopy was performed recently and showed several polyps but no obvious sign of disease. CT scan of the pelvis also was normal.   Problem List/Past Medical Nathaniel Ruff, MD; Q000111Q 4:37 PM) ANAL OR RECTAL PAIN (K62.89)  Other Problems Nathaniel Ruff, MD; Q000111Q 4:37 PM) High blood pressure Gastroesophageal Reflux Disease Hemorrhoids  Past Surgical History Nathaniel Ruff, MD; Q000111Q 4:37 PM) Knee Surgery Bilateral. Tonsillectomy Colon Polyp Removal - Colonoscopy Vasectomy  Diagnostic Studies History Nathaniel Ruff, MD; Q000111Q 4:37 PM) Colonoscopy within last year  Allergies Nathaniel Lorenzo, LPN; QA348G 624THL PM) Gabapentin *ANTICONVULSANTS*  Medication History Nathaniel Ruff, MD; Q000111Q 4:37 PM) Ciprofloxacin HCl (500MG  Tablet, Oral) Active. Medications Reconciled Dicyclomine HCl (20MG  Tablet, Oral) Active. Lisinopril (10MG  Tablet, Oral) Active. Metoprolol Succinate ER (25MG  Tablet ER 24HR, Oral) Active.  Social History Nathaniel Ruff, MD; Q000111Q 4:37 PM) Alcohol use Moderate alcohol use. No caffeine use No drug use Tobacco use Never smoker.  Family History Nathaniel Ruff, MD; Q000111Q 4:37  PM) Heart Disease Brother, Mother. Hypertension Brother, Father, Mother. Alcohol Abuse Brother. Breast Cancer Mother.     Review of Systems Nathaniel Ruff MD; Q000111Q 4:37 PM) General Not Present- Appetite Loss, Chills, Fatigue, Fever, Night Sweats, Weight Gain and Weight Loss. Skin Not Present- Change in Wart/Mole, Dryness, Hives, Jaundice, New Lesions, Non-Healing Wounds, Rash and Ulcer. HEENT Not Present- Earache, Hearing Loss, Hoarseness, Nose Bleed, Oral Ulcers, Ringing in the Ears, Seasonal Allergies, Sinus Pain, Sore Throat, Visual Disturbances, Wears glasses/contact lenses and Yellow Eyes. Respiratory Not Present- Bloody sputum, Chronic Cough, Difficulty Breathing, Snoring and Wheezing. Breast Not Present- Breast Mass, Breast Pain, Nipple Discharge and Skin Changes. Cardiovascular Not Present- Chest Pain, Difficulty Breathing Lying Down, Leg Cramps, Palpitations, Rapid Heart Rate, Shortness of Breath and Swelling of Extremities. Gastrointestinal Not Present- Abdominal Pain, Bloating, Bloody Stool, Change in Bowel Habits, Chronic diarrhea, Constipation, Difficulty Swallowing, Excessive gas, Gets full quickly at meals, Hemorrhoids, Indigestion, Nausea, Rectal Pain and Vomiting. Male Genitourinary Not Present- Blood in Urine, Change in Urinary Stream, Frequency, Impotence, Nocturia, Painful Urination, Urgency and Urine Leakage. Musculoskeletal Not Present- Back Pain, Joint Pain, Joint Stiffness, Muscle Pain, Muscle Weakness and Swelling of Extremities. Neurological Not Present- Decreased Memory, Fainting, Headaches, Numbness, Seizures, Tingling, Tremor, Trouble walking and Weakness. Psychiatric Not Present- Anxiety, Bipolar, Change in Sleep Pattern, Depression, Fearful and Frequent crying. Endocrine Not Present- Cold Intolerance, Excessive Hunger, Hair Changes, Heat Intolerance and New Diabetes. Hematology Not Present- Easy Bruising, Excessive bleeding, Gland problems, HIV and  Persistent Infections.  Vitals Nathaniel Billings Dockery LPN; QA348G 624THL PM) 06/30/2015 4:17 PM Weight: 280 lb Height: 74in Body Surface Area: 2.51 m Body Mass Index: 35.95 kg/m  Temp.: 98.64F(Oral)  Pulse: 85 (Regular)  BP: 158/86 (Sitting, Left Arm, Standard)      Physical Exam Nathaniel Ruff MD; Q000111Q 4:37 PM)  General Mental Status-Alert. General Appearance-Not in acute distress. Build & Nutrition-Well nourished. Posture-Normal posture. Gait-Normal.  Head and Neck Head-normocephalic, atraumatic with no lesions or palpable masses. Trachea-midline.  Chest and Lung Exam Chest and lung exam reveals -on auscultation, normal breath sounds, no adventitious sounds and normal vocal resonance.  Cardiovascular Cardiovascular examination reveals -normal heart sounds, regular rate and rhythm with no murmurs.  Abdomen Inspection Inspection of the abdomen reveals - No Hernias. Palpation/Percussion Palpation and Percussion of the abdomen reveal - Soft, Non Tender, No Rigidity (guarding), No hepatosplenomegaly and No Palpable abdominal masses.  Rectal Note: Unable to examine due to pain  Neurologic Neurologic evaluation reveals -alert and oriented x 3 with no impairment of recent or remote memory, normal attention span and ability to concentrate, normal sensation and normal coordination.  Musculoskeletal Normal Exam - Bilateral-Upper Extremity Strength Normal and Lower Extremity Strength Normal.    Assessment & Plan Nathaniel Ruff MD; Q000111Q 4:35 PM)  ANAL OR RECTAL PAIN (K62.89) Impression: 54 year old male who presents to the office with anal pain. He has tried multiple treatments in the past with minimal success. He reports pain that begins approximately 20-30 minutes after bowel movements and continues for several hours. We are unable to perform an exam in the office due to pain. I have recommended an exam under anesthesia. If we  find a prolapsing hemorrhoid or a abscess we will go ahead and perform the appropriate treatment for that. Otherwise we will discuss his treatment after the procedure.

## 2015-07-01 ENCOUNTER — Telehealth: Payer: Self-pay | Admitting: Gastroenterology

## 2015-07-01 NOTE — Telephone Encounter (Signed)
Nathaniel Velazquez this pt was scheduled for [facility] by [name], they are asking to speak to her nurse because the appt did not go well.

## 2015-07-01 NOTE — Telephone Encounter (Signed)
Referral has been made records faxed to Dr Morton Stall.  Waiting on appt

## 2015-07-01 NOTE — Telephone Encounter (Signed)
Spoke with patient and he states he saw Dr. Marcello Moores at [place] and she treated him with disdain. He does not want to see her again. States [facility] was to set him up for exam under anesthesia. He wants to be referred somewhere else. States he would like to talk with Dr. Ardis Hughs.

## 2015-07-01 NOTE — Telephone Encounter (Signed)
We spoke, he wants a referral to [place].  I think Dr. Morton Stall is probably the best one for him to see.  For severe anal pain, likely fissure.  History of fissure as well.  Thanks

## 2015-07-02 NOTE — Telephone Encounter (Signed)
I agree thanks!

## 2015-07-02 NOTE — Telephone Encounter (Signed)
Pt is calling back about  His referral to Dr. Morton Stall.  He said he is severe pain

## 2015-07-02 NOTE — Telephone Encounter (Signed)
The pt called and states he is in severe anal pain and would like to see someone at [facility] today.  He saw Dr Marcello Moores and was not happy with her bedside manner and requested a referral to [facility].  The referral was made on 07/01/15 but the first available appt is 08/2015.  Pt requested me to call [facility] and see "what can be done" .  I called [facility] and they state that the anesthesia appt for evaluation is at the schedulers and the nurse states she will get a message to Dr Marcello Moores that the pt is is severe pain.  I called the pt back and advised him of the information from [facility] and told him if he is in severe pain he should go to the ER for evaluation per [facility].  Pt agreed and states he will call [facility] himself and speak to the nurse.

## 2015-07-03 DIAGNOSIS — I10 Essential (primary) hypertension: Secondary | ICD-10-CM | POA: Insufficient documentation

## 2015-07-07 ENCOUNTER — Encounter (HOSPITAL_BASED_OUTPATIENT_CLINIC_OR_DEPARTMENT_OTHER): Admission: RE | Payer: Self-pay | Source: Ambulatory Visit

## 2015-07-07 ENCOUNTER — Ambulatory Visit (HOSPITAL_BASED_OUTPATIENT_CLINIC_OR_DEPARTMENT_OTHER)
Admission: RE | Admit: 2015-07-07 | Payer: BLUE CROSS/BLUE SHIELD | Source: Ambulatory Visit | Admitting: General Surgery

## 2015-07-07 SURGERY — EXAM UNDER ANESTHESIA WITH HEMORRHOIDECTOMY
Anesthesia: Monitor Anesthesia Care

## 2015-07-22 DIAGNOSIS — K602 Anal fissure, unspecified: Secondary | ICD-10-CM | POA: Insufficient documentation

## 2015-10-28 ENCOUNTER — Ambulatory Visit: Payer: BLUE CROSS/BLUE SHIELD | Admitting: Family Medicine

## 2015-11-16 ENCOUNTER — Other Ambulatory Visit: Payer: Self-pay | Admitting: Family Medicine

## 2015-11-18 ENCOUNTER — Telehealth: Payer: Self-pay

## 2015-11-18 DIAGNOSIS — H9011 Conductive hearing loss, unilateral, right ear, with unrestricted hearing on the contralateral side: Secondary | ICD-10-CM | POA: Insufficient documentation

## 2015-11-18 DIAGNOSIS — H6991 Unspecified Eustachian tube disorder, right ear: Secondary | ICD-10-CM | POA: Insufficient documentation

## 2015-11-18 DIAGNOSIS — H6981 Other specified disorders of Eustachian tube, right ear: Secondary | ICD-10-CM | POA: Insufficient documentation

## 2015-11-18 NOTE — Telephone Encounter (Signed)
Needs RTC. LM on VM.

## 2015-11-18 NOTE — Telephone Encounter (Signed)
Pt is needing a referral to an ent office   Best number [phone]

## 2015-11-18 NOTE — Telephone Encounter (Signed)
Debbie, do you want to give any RFs, or pt need to return for re-eval?

## 2016-02-19 ENCOUNTER — Ambulatory Visit (INDEPENDENT_AMBULATORY_CARE_PROVIDER_SITE_OTHER): Payer: BLUE CROSS/BLUE SHIELD | Admitting: Family Medicine

## 2016-02-19 ENCOUNTER — Encounter: Payer: Self-pay | Admitting: Family Medicine

## 2016-02-19 VITALS — BP 134/66 | HR 91 | Temp 98.3°F | Resp 16 | Ht 72.5 in | Wt 283.4 lb

## 2016-02-19 DIAGNOSIS — I10 Essential (primary) hypertension: Secondary | ICD-10-CM

## 2016-02-19 DIAGNOSIS — R945 Abnormal results of liver function studies: Secondary | ICD-10-CM

## 2016-02-19 DIAGNOSIS — N529 Male erectile dysfunction, unspecified: Secondary | ICD-10-CM | POA: Diagnosis not present

## 2016-02-19 DIAGNOSIS — Z13220 Encounter for screening for lipoid disorders: Secondary | ICD-10-CM | POA: Diagnosis not present

## 2016-02-19 DIAGNOSIS — R7989 Other specified abnormal findings of blood chemistry: Secondary | ICD-10-CM | POA: Diagnosis not present

## 2016-02-19 DIAGNOSIS — L301 Dyshidrosis [pompholyx]: Secondary | ICD-10-CM

## 2016-02-19 MED ORDER — LISINOPRIL 10 MG PO TABS: ORAL_TABLET | ORAL | 1 refills | Status: DC

## 2016-02-19 MED ORDER — METOPROLOL SUCCINATE ER 25 MG PO TB24: ORAL_TABLET | ORAL | 1 refills | Status: DC

## 2016-02-19 MED ORDER — SILDENAFIL CITRATE 100 MG PO TABS: 50.0000 mg | ORAL_TABLET | Freq: Every day | ORAL | 1 refills | Status: DC | PRN

## 2016-02-19 NOTE — Patient Instructions (Addendum)
Return within the next 1 week for fasting lab visit. Once I receive that bloodwork, I will let you know the results. No change in medication for now.  Low to moderate intensity exercise most days of the week.    Okay to try 1/2-1 Viagra approximately 30 minutes prior to onset of sexual activity as needed. If any new side effects with that medication, stop and return to discuss this. I will check a testosterone level, but as we discussed, this would need to be repeated at least once in the morning to verify if it is low.  Follow-up within 6 months for a physical.   Hand Dermatitis Hand dermatitis (dyshidrotic eczema) is a skin condition in which small, itchy, raised dots or fluid-filled blisters form over the palms of the hands. Outbreaks of hand dermatitis can last 3 to 4 weeks. CAUSES  The cause of hand dermatitis is unknown. However, it occurs most often in patients with a history of allergies such as:  Hay fever.  Allergic asthma.  Allergies to latex. Chemical exposure, injuries, and environmental irritants can make hand dermatitis worse. Washing your hands too frequently can remove natural oils, which can dry out the skin and contribute to outbreaks of hand dermatitis. SYMPTOMS  The most common symptom of hand dermatitis is intense itching. Cracks or grooves (fissures) on the fingers can also develop. Affected areas can be painful, especially areas where large blisters have formed. DIAGNOSIS Your caregiver can usually tell what the problem is by doing a physical exam. PREVENTION  Avoid excessive hand washing.  Avoid the use of harsh chemicals.  Wear protective gloves when handling products that can irritate your skin. TREATMENT  Steroid creams and ointments, such as over-the-counter 1% hydrocortisone cream, can reduce inflammation and improve moisture retention. These should be applied at least 2 to 4 times per day. Your caregiver may ask you to use a stronger prescription steroid  cream to help speed the healing of blistered and cracked skin. In severe cases, oral steroid medicine may be needed. If you have an infection, antibiotics may be needed. Your caregiver may also prescribe antihistamines. These medicines help reduce itching. HOME CARE INSTRUCTIONS  Only take over-the-counter or prescription medicines as directed by your caregiver.  You may use wet or cold compresses. This can help:  Alleviate itching.  Increase the effectiveness of topical creams.  Minimize blisters. SEEK MEDICAL CARE IF:  The rash is not better after 1 week of treatment.  Signs of infection develop, such as redness, tenderness, or yellowish-white fluid (pus).  The rash is spreading.   This information is not intended to replace advice given to you by your health care provider. Make sure you discuss any questions you have with your health care provider.   Document Released: 07/05/2005 Document Revised: 09/27/2011 Document Reviewed: 01/17/2015 Elsevier Interactive Patient Education 2016 [place] skin care measures aimed at reducing skin irritation and restoring the skin barrier include [24]: ?Using lukewarm water and soap-free cleansers to wash hands ?Drying hands thoroughly after washing ?Applying emollients (eg, petroleum jelly) immediately after hand drying and as often as possible  ?Wearing cotton gloves under vinyl or other nonlatex gloves when performing wet work ?Removing rings and watches and bracelets before wet work ?Wearing protective gloves in cold weather  ?Wearing task-specific gloves for frictional exposures (eg, gardening, carpentry) ?Avoiding exposure to irritants (eg, detergents, solvents, hair lotions or dyes, acidic foods [eg, citrus fruit])    IF you received an x-ray today,  you will receive an invoice from [facility]. Please contact [facility] at [phone] with questions or concerns regarding your invoice.   IF you  received labwork today, you will receive an invoice from [employer]. Please contact [facility] at [phone] with questions or concerns regarding your invoice.   Our billing staff will not be able to assist you with questions regarding bills from these companies.  You will be contacted with the lab results as soon as they are available. The fastest way to get your results is to activate your My Chart account. Instructions are located on the last page of this paperwork. If you have not heard from [place] regarding the results in 2 weeks, please contact this office.

## 2016-02-19 NOTE — Progress Notes (Signed)
By signing my name below, I, Nathaniel Velazquez, attest that this documentation has been prepared under the direction and in the presence of Nathaniel Ray, MD.  Electronically Signed: Verlee Velazquez, Medical Scribe. 02/19/16. 2:50 PM.  Subjective:    Patient ID: Nathaniel Velazquez, male    DOB: Sep 02, 1960, 55 y.o.   MRN: [id number]  HPI Chief Complaint  Patient presents with  . Medication Refill    lisinopril and metoprolol succinate    HPI Comments: Nathaniel Velazquez is a 55 y.o. male with a PMHx of HTN, HLD, and elevated LFTs who presents to the [facility] for HTN. Pt would like medication refill for Lisinopril and Toprol-XL. He is a new pt to me, and a previous pt of Nathaniel Velazquez. Pt was last seen by Nathaniel Velazquez Oct 2016. Pt ate 1.5 hours ago.  HTN: Takes Lisinopril 10 mg QD, and Toprol-XL 25 mg QD. Pt is usually in 130-180. Pt occasionally gets light-headed when he stands up too quick. Pt occasionally exercises, but he had a anal fissure earlier this year that prevented him from going to the gym. Pt's fissure has resolved since then. Pt denies chest pain, SOB while exercising. Lab Results  Component Value Date   CREATININE 1.11 04/23/2015   HLD: Last EKG was done 05/17/13 by Nathaniel Velazquez, which was nl. Pt last saw him 3 years ago  Lab Results  Component Value Date   CHOL 169 04/23/2015   HDL 49 04/23/2015   LDLCALC 87 04/23/2015   LDLDIRECT 91.3 06/19/2012   TRIG 166 (H) 04/23/2015   CHOLHDL 3.4 04/23/2015   Lab Results  Component Value Date   ALT 87 (H) 04/23/2015   AST 47 (H) 04/23/2015   ALKPHOS 68 04/23/2015   BILITOT 0.7 04/23/2015   Hx of HCM: Had cardiac MRI that was neg but mild hypertrophy, by seen at [facility], did not have HCM    Skin: Pt mentions he has dyshidrotic eczema on his finger tips which he's had for several years that he sees a dermatologist for. Pt last saw derm a year ago. Pt occasionally has cracked skin on his finger tip and puts  Clobetasol ointment on while wearing a rubber finger cover over night. Pt uses it several times a day.   ED: In private, pt mentioned he has had some difficulty maintaining an erection intermittently. Noted this occurred around time of treatment for anal fissure. He has not had to take medication for this in the past. No known hx of heart disease.  Patient Active Problem List   Diagnosis Date Noted  . Rectal pain 06/05/2015  . Hypertension   . Obesity   . Hyperlipidemia 11/21/2008  . PALPITATIONS 10/30/2008   Past Medical History:  Diagnosis Date  . GERD (gastroesophageal reflux disease)   . Hypertension   . Obesity    Steroid injection in rt knee   Past Surgical History:  Procedure Laterality Date  . ANAL FISTULECTOMY  1987  . APPENDECTOMY    . TONSILLECTOMY     Allergies  Allergen Reactions  . Gabapentin    Prior to Admission medications   Medication Sig Start Date End Date Taking? Authorizing Provider  clobetasol ointment (TEMOVATE) 0.05 % As directed 05/15/12   Historical Provider, MD  dicyclomine (BENTYL) 20 MG tablet Take one po TID prn for spasms Patient not taking: Reported on 06/24/2015 06/05/15   Nathaniel Billow D Zehr, PA-C  dicyclomine (BENTYL) 20 MG tablet Take 1 tablet (20  mg total) by mouth 3 (three) times daily. 06/23/15   Nathaniel P Hvozdovic, PA-C  Ibuprofen (ADVIL PO) Take by mouth daily.    Historical Provider, MD  lisinopril (PRINIVIL,ZESTRIL) 10 MG tablet TAKE 1 BY MOUTH DAILY 04/23/15   Nathaniel Velazquez  metoprolol succinate (TOPROL-XL) 25 MG 24 hr tablet TAKE 1 BY MOUTH DAILY.  "NO MORE REFILLS WITHOUT OV" 04/23/15   Nathaniel Velazquez  nystatin-triamcinolone ointment (MYCOLOG) APPLY SPARINGLY TO THE AFFECTED AREA FOR UP TO 7 DAYS 11/18/15   Nathaniel Velazquez   Social History   Social History  . Marital status: Married    Spouse name: N/A  . Number of children: N/A  . Years of education: N/A   Occupational History  . Not on file.   Social  History Main Topics  . Smoking status: Never Smoker  . Smokeless tobacco: Never Used  . Alcohol use 4.2 oz/week    7 Standard drinks or equivalent per week  . Drug use: No  . Sexual activity: Not on file   Other Topics Concern  . Not on file   Social History Narrative   Sales rep   Depression screen [facility] 2/9 02/19/2016 06/24/2015 05/26/2015 04/23/2015 04/10/2014  Decreased Interest 0 0 0 0 0  Down, Depressed, Hopeless 0 0 0 0 0  PHQ - 2 Score 0 0 0 0 0   Review of Systems  Constitutional: Negative for fatigue and unexpected weight change.  Eyes: Negative for visual disturbance.  Respiratory: Negative for cough, chest tightness and shortness of breath.   Cardiovascular: Negative for chest pain, palpitations and leg swelling.  Gastrointestinal: Negative for abdominal pain and blood in stool.  Skin: Positive for wound.  Neurological: Positive for light-headedness. Negative for dizziness and headaches.    Objective:  Physical Exam  Constitutional: He is oriented to person, place, and time. He appears well-developed and well-nourished.  HENT:  Head: Normocephalic and atraumatic.  Eyes: EOM are normal. Pupils are equal, round, and reactive to light.  Neck: No JVD present. Carotid bruit is not present.  Cardiovascular: Normal rate, regular rhythm and normal heart sounds.   No murmur heard. Pulmonary/Chest: Effort normal and breath sounds normal. He has no rales.  Musculoskeletal: He exhibits no edema.  Neurological: He is alert and oriented to person, place, and time.  Skin: Skin is warm and dry.  Multiple areas of peeling skin on the palmar hand into the finger tips. Dorsal hand is uninvolved  Psychiatric: He has a normal mood and affect.  Vitals reviewed.  BP 134/66 (BP Location: Left Arm, Patient Position: Sitting, Cuff Size: Large)   Pulse 91   Temp 98.3 F (36.8 C) (Oral)   Resp 16   Ht 6' 0.5" (1.842 m)   Wt 283 lb 6.4 oz (128.5 kg)   BMI 37.91 kg/m    EKG Reading: Sinus  rhythm. No acute findings Assessment & Plan:    Nathaniel Velazquez is a 55 y.o. male Erectile dysfunction, unspecified erectile dysfunction type - Plan: Testosterone, sildenafil (VIAGRA) 100 MG tablet  - Check testosterone. Agreed on a trial of Viagra.  -viagra Rx given - use lowest effective dose. Side effects discussed (including but not limited to headache/flushing, blue discoloration of vision, possible vascular steal and risk of cardiac effects if underlying unknown coronary artery disease, and permanent sensorineural hearing loss). Understanding expressed.  Essential hypertension - Plan: EKG 12-Lead, lisinopril (PRINIVIL,ZESTRIL) 10 MG tablet, metoprolol succinate (TOPROL-XL) 25 MG 24  hr tablet, DISCONTINUED: lisinopril (PRINIVIL,ZESTRIL) 10 MG tablet, DISCONTINUED: metoprolol succinate (TOPROL-XL) 25 MG 24 hr tablet  -Stable. Continue lisinopril 10 mg daily, Toprol 25 mg daily. CMP pending.  -Exercise including amount of time and type were discussed.  Dyshidrotic eczema  -Continue topical steroid cream as prescribed by dermatology, but also given handout including other management options.  Screening for hyperlipidemia - Plan: Lipid panel  Elevated LFTs - Plan: COMPLETE METABOLIC PANEL WITH GFR  -Repeat CMP.  Meds ordered this encounter  Medications  . DISCONTD: lisinopril (PRINIVIL,ZESTRIL) 10 MG tablet    Sig: TAKE 1 BY MOUTH DAILY    Dispense:  90 tablet    Refill:  1  . DISCONTD: metoprolol succinate (TOPROL-XL) 25 MG 24 hr tablet    Sig: TAKE 1 BY MOUTH DAILY.    Dispense:  90 tablet    Refill:  1  . sildenafil (VIAGRA) 100 MG tablet    Sig: Take 0.5-1 tablets (50-100 mg total) by mouth daily as needed for erectile dysfunction.    Dispense:  5 tablet    Refill:  1  . lisinopril (PRINIVIL,ZESTRIL) 10 MG tablet    Sig: TAKE 1 BY MOUTH DAILY    Dispense:  90 tablet    Refill:  1  . metoprolol succinate (TOPROL-XL) 25 MG 24 hr tablet    Sig: TAKE 1 BY MOUTH DAILY.     Dispense:  90 tablet    Refill:  1   Patient Instructions   Return within the next 1 week for fasting lab visit. Once I receive that bloodwork, I will let you know the results. No change in medication for now.  Low to moderate intensity exercise most days of the week.    Okay to try 1/2-1 Viagra approximately 30 minutes prior to onset of sexual activity as needed. If any new side effects with that medication, stop and return to discuss this. I will check a testosterone level, but as we discussed, this would need to be repeated at least once in the morning to verify if it is low.  Follow-up within 6 months for a physical.   Hand Dermatitis Hand dermatitis (dyshidrotic eczema) is a skin condition in which small, itchy, raised dots or fluid-filled blisters form over the palms of the hands. Outbreaks of hand dermatitis can last 3 to 4 weeks. CAUSES  The cause of hand dermatitis is unknown. However, it occurs most often in patients with a history of allergies such as:  Hay fever.  Allergic asthma.  Allergies to latex. Chemical exposure, injuries, and environmental irritants can make hand dermatitis worse. Washing your hands too frequently can remove natural oils, which can dry out the skin and contribute to outbreaks of hand dermatitis. SYMPTOMS  The most common symptom of hand dermatitis is intense itching. Cracks or grooves (fissures) on the fingers can also develop. Affected areas can be painful, especially areas where large blisters have formed. DIAGNOSIS Your caregiver can usually tell what the problem is by doing a physical exam. PREVENTION  Avoid excessive hand washing.  Avoid the use of harsh chemicals.  Wear protective gloves when handling products that can irritate your skin. TREATMENT  Steroid creams and ointments, such as over-the-counter 1% hydrocortisone cream, can reduce inflammation and improve moisture retention. These should be applied at least 2 to 4 times per day.  Your caregiver may ask you to use a stronger prescription steroid cream to help speed the healing of blistered and cracked skin. In severe cases,  oral steroid medicine may be needed. If you have an infection, antibiotics may be needed. Your caregiver may also prescribe antihistamines. These medicines help reduce itching. HOME CARE INSTRUCTIONS  Only take over-the-counter or prescription medicines as directed by your caregiver.  You may use wet or cold compresses. This can help:  Alleviate itching.  Increase the effectiveness of topical creams.  Minimize blisters. SEEK MEDICAL CARE IF:  The rash is not better after 1 week of treatment.  Signs of infection develop, such as redness, tenderness, or yellowish-white fluid (pus).  The rash is spreading.   This information is not intended to replace advice given to you by your health care provider. Make sure you discuss any questions you have with your health care provider.   Document Released: 07/05/2005 Document Revised: 09/27/2011 Document Reviewed: 01/17/2015 Elsevier Interactive Patient Education 2016 [place] skin care measures aimed at reducing skin irritation and restoring the skin barrier include [24]: ?Using lukewarm water and soap-free cleansers to wash hands ?Drying hands thoroughly after washing ?Applying emollients (eg, petroleum jelly) immediately after hand drying and as often as possible  ?Wearing cotton gloves under vinyl or other nonlatex gloves when performing wet work ?Removing rings and watches and bracelets before wet work ?Wearing protective gloves in cold weather  ?Wearing task-specific gloves for frictional exposures (eg, gardening, carpentry) ?Avoiding exposure to irritants (eg, detergents, solvents, hair lotions or dyes, acidic foods [eg, citrus fruit])    IF you received an x-Velazquez today, you will receive an invoice from [facility]. Please contact [facility] at [phone]  with questions or concerns regarding your invoice.   IF you received labwork today, you will receive an invoice from [facility] [employer]. Please contact [facility] at [phone] with questions or concerns regarding your invoice.   Our billing staff will not be able to assist you with questions regarding bills from these companies.  You will be contacted with the lab results as soon as they are available. The fastest way to get your results is to activate your My Chart account. Instructions are located on the last page of this paperwork. If you have not heard from [place] regarding the results in 2 weeks, please contact this office.       I personally performed the services described in this documentation, which was scribed in my presence. The recorded information has been reviewed and considered, and addended by me as needed.   Signed,   Nathaniel Ray, MD [facility].  02/21/16 11:30 PM

## 2016-02-25 ENCOUNTER — Other Ambulatory Visit (INDEPENDENT_AMBULATORY_CARE_PROVIDER_SITE_OTHER): Payer: BLUE CROSS/BLUE SHIELD | Admitting: Family Medicine

## 2016-02-25 DIAGNOSIS — Z13220 Encounter for screening for lipoid disorders: Secondary | ICD-10-CM

## 2016-02-25 DIAGNOSIS — R7989 Other specified abnormal findings of blood chemistry: Secondary | ICD-10-CM | POA: Diagnosis not present

## 2016-02-25 DIAGNOSIS — R945 Abnormal results of liver function studies: Principal | ICD-10-CM

## 2016-02-25 DIAGNOSIS — N529 Male erectile dysfunction, unspecified: Secondary | ICD-10-CM

## 2016-02-25 LAB — COMPLETE METABOLIC PANEL WITH GFR
ALBUMIN: 4.4 g/dL (ref 3.6–5.1)
ALK PHOS: 68 U/L (ref 40–115)
ALT: 107 U/L — ABNORMAL HIGH (ref 9–46)
AST: 55 U/L — ABNORMAL HIGH (ref 10–35)
BILIRUBIN TOTAL: 0.7 mg/dL (ref 0.2–1.2)
BUN: 13 mg/dL (ref 7–25)
CALCIUM: 9.7 mg/dL (ref 8.6–10.3)
CO2: 26 mmol/L (ref 20–31)
Chloride: 100 mmol/L (ref 98–110)
Creat: 0.93 mg/dL (ref 0.70–1.33)
GLUCOSE: 99 mg/dL (ref 65–99)
POTASSIUM: 4.4 mmol/L (ref 3.5–5.3)
SODIUM: 137 mmol/L (ref 135–146)
TOTAL PROTEIN: 7.1 g/dL (ref 6.1–8.1)

## 2016-02-25 LAB — LIPID PANEL
CHOL/HDL RATIO: 3.2 ratio (ref <=5.0)
CHOLESTEROL: 163 mg/dL (ref 125–200)
HDL: 51 mg/dL (ref >=40)
LDL Cholesterol: 78 mg/dL (ref <130)
TRIGLYCERIDES: 168 mg/dL — AB (ref <150)
VLDL: 34 mg/dL — ABNORMAL HIGH (ref <30)

## 2016-02-25 LAB — TESTOSTERONE: TESTOSTERONE: 279 ng/dL (ref 250–827)

## 2016-02-25 NOTE — Patient Instructions (Signed)
     IF you received an x-ray today, you will receive an invoice from [facility]. Please contact [facility] at [phone] with questions or concerns regarding your invoice.   IF you received labwork today, you will receive an invoice from [facility]. Please contact [facility] at [phone] with questions or concerns regarding your invoice.   Our billing staff will not be able to assist you with questions regarding bills from these companies.  You will be contacted with the lab results as soon as they are available. The fastest way to get your results is to activate your My Chart account. Instructions are located on the last page of this paperwork. If you have not heard from us regarding the results in 2 weeks, please contact this office.      

## 2016-02-29 ENCOUNTER — Other Ambulatory Visit: Payer: Self-pay | Admitting: Family Medicine

## 2016-02-29 DIAGNOSIS — R945 Abnormal results of liver function studies: Principal | ICD-10-CM

## 2016-02-29 DIAGNOSIS — R7989 Other specified abnormal findings of blood chemistry: Secondary | ICD-10-CM

## 2016-02-29 NOTE — Progress Notes (Signed)
Elevated LFTs, will check ultrasound and other liver testing. See lab notes

## 2016-03-01 ENCOUNTER — Other Ambulatory Visit (INDEPENDENT_AMBULATORY_CARE_PROVIDER_SITE_OTHER): Payer: BLUE CROSS/BLUE SHIELD | Admitting: Family Medicine

## 2016-03-01 DIAGNOSIS — R7989 Other specified abnormal findings of blood chemistry: Secondary | ICD-10-CM | POA: Diagnosis not present

## 2016-03-01 DIAGNOSIS — R945 Abnormal results of liver function studies: Principal | ICD-10-CM

## 2016-03-01 LAB — HEPATIC FUNCTION PANEL
ALBUMIN: 4.3 g/dL (ref 3.6–5.1)
ALK PHOS: 64 U/L (ref 40–115)
ALT: 86 U/L — AB (ref 9–46)
AST: 42 U/L — AB (ref 10–35)
BILIRUBIN TOTAL: 0.6 mg/dL (ref 0.2–1.2)
Bilirubin, Direct: 0.1 mg/dL (ref <=0.2)
Indirect Bilirubin: 0.5 mg/dL (ref 0.2–1.2)
Total Protein: 6.9 g/dL (ref 6.1–8.1)

## 2016-03-01 NOTE — Progress Notes (Signed)
Pt here for lab only visit 

## 2016-03-02 LAB — HEPATITIS B SURFACE ANTIBODY, QUANTITATIVE: Hepatitis B-Post: <5 m[IU]/mL

## 2016-03-02 LAB — HEPATITIS B SURFACE ANTIGEN: Hepatitis B Surface Ag: NEGATIVE

## 2016-03-02 LAB — HEPATITIS C ANTIBODY: HCV Ab: NEGATIVE

## 2016-03-19 ENCOUNTER — Ambulatory Visit
Admission: RE | Admit: 2016-03-19 | Discharge: 2016-03-19 | Disposition: A | Discharge status: Home / Self Care | Payer: BLUE CROSS/BLUE SHIELD | Source: Ambulatory Visit | Attending: Family Medicine | Admitting: Family Medicine

## 2016-03-19 DIAGNOSIS — R945 Abnormal results of liver function studies: Principal | ICD-10-CM

## 2016-03-19 DIAGNOSIS — R7989 Other specified abnormal findings of blood chemistry: Secondary | ICD-10-CM

## 2016-03-26 ENCOUNTER — Encounter: Payer: Self-pay | Admitting: Family Medicine

## 2016-07-19 HISTORY — PX: ROTATOR CUFF REPAIR: SHX139

## 2016-09-13 ENCOUNTER — Ambulatory Visit (INDEPENDENT_AMBULATORY_CARE_PROVIDER_SITE_OTHER): Payer: BC Managed Care – PPO | Admitting: Family Medicine

## 2016-09-13 ENCOUNTER — Ambulatory Visit (INDEPENDENT_AMBULATORY_CARE_PROVIDER_SITE_OTHER): Payer: BC Managed Care – PPO

## 2016-09-13 VITALS — BP 142/78 | HR 72 | Temp 98.1°F | Resp 18 | Ht 72.0 in | Wt 292.0 lb

## 2016-09-13 DIAGNOSIS — M7502 Adhesive capsulitis of left shoulder: Secondary | ICD-10-CM | POA: Diagnosis not present

## 2016-09-13 DIAGNOSIS — M7542 Impingement syndrome of left shoulder: Secondary | ICD-10-CM | POA: Diagnosis not present

## 2016-09-13 DIAGNOSIS — I10 Essential (primary) hypertension: Secondary | ICD-10-CM

## 2016-09-13 DIAGNOSIS — M25512 Pain in left shoulder: Secondary | ICD-10-CM | POA: Diagnosis not present

## 2016-09-13 DIAGNOSIS — R7989 Other specified abnormal findings of blood chemistry: Secondary | ICD-10-CM | POA: Diagnosis not present

## 2016-09-13 DIAGNOSIS — R945 Abnormal results of liver function studies: Secondary | ICD-10-CM

## 2016-09-13 DIAGNOSIS — Z23 Encounter for immunization: Secondary | ICD-10-CM

## 2016-09-13 MED ORDER — LISINOPRIL 10 MG PO TABS: ORAL_TABLET | ORAL | 1 refills | Status: DC

## 2016-09-13 MED ORDER — METOPROLOL SUCCINATE ER 25 MG PO TB24
ORAL_TABLET | ORAL | 1 refills | Status: DC
Start: 2016-09-13 — End: 2016-09-13

## 2016-09-13 MED ORDER — METOPROLOL SUCCINATE ER 25 MG PO TB24: ORAL_TABLET | ORAL | 1 refills | Status: DC

## 2016-09-13 MED ORDER — ZOSTER VACCINE LIVE 19400 UNT/0.65ML ~~LOC~~ SUSR: 0.6500 mL | Freq: Once | SUBCUTANEOUS | 0 refills | Status: AC

## 2016-09-13 NOTE — Progress Notes (Signed)
By signing my name below, I, Mesha Guinyard, attest that this documentation has been prepared under the direction and in the presence of [name], MD.  Electronically Signed: Verlee Monte, Medical Scribe. 09/13/16. 3:45 PM.  Subjective:    Patient ID: Nathaniel Velazquez, male    DOB: 1960-12-05, 56 y.o.   MRN: [id number]  HPI Chief Complaint  Patient presents with  . Shoulder Pain    left shoulder been off and on for a year   . Medication Refill    HPI Comments: Nathaniel Velazquez is a 56 y.o. male who presents to the [facility] complaining of intermittent left shoulder pain onset the past 10 years, but has been worsening for the past year. Pt has been more sore after hedge trimming a few weeks ago and states the pain is intolerable. Pt is left handed and reports he's had similar sxs occur in the past but it eventually resolves on it's own. Pain occurs with lifting daily used objects, ie: opening the refrigerator, holding a glass of water. Pt takes advil 1-2x daily without relief of his sxs, and last summer he started putting a wet towel over his shoulder for little relief of his sxs. Pt also takes advil daily for his knees. Pt nl sleeps on his side and he can no longer sleep pain free on his left side for longer than 15 mins causing him loss of sleep. When he switches on his right side for relief, he eventually feels pain on his right shoulder as well. Denies neck pain, and weakness.  Pt is not fasting.  Immunizations: Pt would like to get his shingles vaccine.   HTN: Pt is compliant with lisinopil and metoprolol. Pt's bp is initially 130/80, and after checking it 2-3x it lowers to 125/80. Denies experiencing chest pain, difficulty breathing, abdominal pain, or other acute negative side effects. Lab Results  Component Value Date   CREATININE 0.93 02/25/2016   Elevated LFTs: He has had a RUQ ultrasound with liver indicating likely fatty infiltrated changes, Lab  Results  Component Value Date   ALT 86 (H) 03/01/2016   AST 42 (H) 03/01/2016   ALKPHOS 64 03/01/2016   BILITOT 0.6 03/01/2016    Patient Active Problem List   Diagnosis Date Noted  . Rectal pain 06/05/2015  . Hypertension   . Obesity   . Hyperlipidemia 11/21/2008  . PALPITATIONS 10/30/2008   Past Medical History:  Diagnosis Date  . GERD (gastroesophageal reflux disease)   . Hypertension   . Obesity    Steroid injection in rt knee   Past Surgical History:  Procedure Laterality Date  . ANAL FISTULECTOMY  1987  . APPENDECTOMY    . TONSILLECTOMY     Allergies  Allergen Reactions  . Gabapentin    Prior to Admission medications   Medication Sig Start Date End Date Taking? Authorizing Provider  clobetasol ointment (TEMOVATE) 0.05 % As directed 05/15/12  Yes Historical Provider, MD  Ibuprofen (ADVIL PO) Take by mouth daily.   Yes Historical Provider, MD  lisinopril (PRINIVIL,ZESTRIL) 10 MG tablet TAKE 1 BY MOUTH DAILY 02/19/16  Yes [name], MD  metoprolol succinate (TOPROL-XL) 25 MG 24 hr tablet TAKE 1 BY MOUTH DAILY. 02/19/16  Yes [name], MD  nystatin-triamcinolone ointment (MYCOLOG) APPLY SPARINGLY TO THE AFFECTED AREA FOR UP TO 7 DAYS 11/18/15  Yes Elby Beck, FNP  sildenafil (VIAGRA) 100 MG tablet Take 0.5-1 tablets (50-100 mg total) by mouth  daily as needed for erectile dysfunction. 02/19/16  Yes [name], MD   Social History   Social History  . Marital status: Married    Spouse name: N/A  . Number of children: N/A  . Years of education: N/A   Occupational History  . Not on file.   Social History Main Topics  . Smoking status: Never Smoker  . Smokeless tobacco: Never Used  . Alcohol use 4.2 oz/week    7 Standard drinks or equivalent per week  . Drug use: No  . Sexual activity: Not on file   Other Topics Concern  . Not on file   Social History Narrative   Sales rep   Review of Systems  Constitutional: Positive for activity  change. Negative for fatigue and unexpected weight change.  Eyes: Negative for visual disturbance.  Respiratory: Negative for cough, chest tightness and shortness of breath.   Cardiovascular: Negative for chest pain, palpitations and leg swelling.  Gastrointestinal: Negative for abdominal pain and blood in stool.  Musculoskeletal: Positive for arthralgias. Negative for neck pain.  Neurological: Negative for dizziness, weakness, light-headedness and headaches.  Psychiatric/Behavioral: Positive for sleep disturbance.   Objective:  Physical Exam  Constitutional: He appears well-developed and well-nourished. No distress.  HENT:  Head: Normocephalic and atraumatic.  Eyes: Conjunctivae are normal.  Neck: Neck supple.  Cardiovascular: Normal rate, regular rhythm and normal heart sounds.  Exam reveals no gallop and no friction rub.   No murmur heard. Pulmonary/Chest: Effort normal and breath sounds normal. No respiratory distress. He has no wheezes. He has no rales.  Musculoskeletal:  C-Spine: FROM without reproduced pain Marion, clavicle, AC non tender Flexion intact Negative lift off ROM of his active abduction is limited to 90 degrees but otherwise intact ROM Passive abduction is also similar to active ROM Negative empty can Full rotator cuff strength Positive Neer at approx 150 degrees Positive Hawkins  Neurological: He is alert.  Skin: Skin is warm and dry.  Psychiatric: He has a normal mood and affect. His behavior is normal.  Nursing note and vitals reviewed.   Vitals:   09/13/16 1526  BP: (!) 142/78  Pulse: 72  Resp: 18  Temp: 98.1 F (36.7 C)  TempSrc: Oral  SpO2: 97%  Weight: 292 lb (132.5 kg)  Height: 6' (1.829 m)  Body mass index is 39.6 kg/m. Assessment & Plan:    Nathaniel Velazquez is a 56 y.o. male Left shoulder pain, unspecified chronicity - Plan: DG Shoulder Left, Ambulatory referral to Orthopedic Surgery Adhesive capsulitis of left shoulder - Plan: Ambulatory  referral to Orthopedic Surgery Impingement syndrome of left shoulder - Plan: Ambulatory referral to Orthopedic Surgery  -Exam most likely suggesting  adhesive capsulitis, with some impingement signs as well. With timing of symptoms, discussed trial of subacromial injection, for both attempted relief of symptoms and diagnostic purposes. Injection declined in office, but would like to meet with orthopedics. Referral was placed  - ibuprofen 400 600 mg every 6 hours when necessary with food, long-term NSAID risks discussed. RTC precautions if worsening prior to orthopedic evaluation.  Essential hypertension - Plan: Comprehensive metabolic panel, metoprolol succinate (TOPROL-XL) 25 MG 24 hr tablet, lisinopril (PRINIVIL,ZESTRIL) 10 MG tablet, DISCONTINUED: lisinopril (PRINIVIL,ZESTRIL) 10 MG tablet, DISCONTINUED: metoprolol succinate (TOPROL-XL) 25 MG 24 hr tablet  - Slightly elevated, may be component of pain. Continue same regimen for now, monitor home blood pressures, and return to discuss changes if remaining over 140/90. Labs obtained as above  -Follow-up visit  in 4-6 months for a complete physical.  Need for shingles vaccine - Plan: Zoster Vaccine Live, PF, (ZOSTAVAX) 13086 UNT/0.65ML injection  -Request a prescription for shingles vaccine, potential decline in effectiveness over time was discussed, but would like to have it given now instead of waiting until 56 years of age. Prescription was provided to fill at his pharmacy  Elevated LFTs - Plan: Comprehensive metabolic panel  -Suspected hepatic steatosis/fatty liver. Repeat CMP for LFT stability.  Meds ordered this encounter  Medications  . Zoster Vaccine Live, PF, (ZOSTAVAX) 57846 UNT/0.65ML injection    Sig: Inject 19,400 Units into the skin once.    Dispense:  1 each    Refill:  0  . DISCONTD: lisinopril (PRINIVIL,ZESTRIL) 10 MG tablet    Sig: TAKE 1 BY MOUTH DAILY    Dispense:  90 tablet    Refill:  1  . DISCONTD: metoprolol succinate  (TOPROL-XL) 25 MG 24 hr tablet    Sig: TAKE 1 BY MOUTH DAILY.    Dispense:  90 tablet    Refill:  1  . metoprolol succinate (TOPROL-XL) 25 MG 24 hr tablet    Sig: TAKE 1 BY MOUTH DAILY.    Dispense:  90 tablet    Refill:  1  . lisinopril (PRINIVIL,ZESTRIL) 10 MG tablet    Sig: TAKE 1 BY MOUTH DAILY    Dispense:  90 tablet    Refill:  1   Patient Instructions    Your shoulder pain has some findings of bursitis and possible frozen shoulder. I will refer you to orthopedist for further evaluation. Ibuprofen 400-600mg  up to every 6 hours as needed for pain, but watch blood pressures with taking NSAIDs. Would also like to avoid long term NSAIDs as risk for heart issues with that class of medications. If pain is worsening prior to evaluation with orthopedics, return for possible injection as we discussed.  No change in blood pressure medications for now. Keep a record of your blood pressures at home, measuring it as discussed below. If remaining over 140/90, return to discuss medication changes. Schedule follow-up for physical within the next 3-6 months.  Shingles vaccine can be filled/given at your pharmacy.   Adhesive Capsulitis Introduction Adhesive capsulitis is inflammation of the tendons and ligaments that surround the shoulder joint (shoulder capsule). This condition causes the shoulder to become stiff and painful to move. Adhesive capsulitis is also called frozen shoulder. What are the causes? This condition may be caused by:  An injury to the shoulder joint.  Straining the shoulder.  Not moving the shoulder for a period of time. This can happen if your arm was injured or in a sling.  Long-standing health problems, such as:  Diabetes.  Thyroid problems.  Heart disease.  Stroke.  Rheumatoid arthritis.  Lung disease. In some cases, the cause may not be known. What increases the risk? This condition is more likely to develop in:  Women.  People who are older than  57 years of age. What are the signs or symptoms? Symptoms of this condition include:  Pain in the shoulder when moving the arm. There may also be pain when parts of the shoulder are touched. The pain is worse at night or when at rest.  Soreness or aching in the shoulder.  Inability to move the shoulder normally.  Muscle spasms. How is this diagnosed? This condition is diagnosed with a physical exam and imaging tests, such as an X-ray or MRI. How is this treated? This condition  may be treated with:  Treatment of the underlying cause or condition.  Physical therapy. This involves performing exercises to get the shoulder moving again.  Medicine. Medicine may be given to relieve pain, inflammation, or muscle spasms.  Steroid injections into the shoulder joint.  Shoulder manipulation. This is a procedure to move the shoulder into another position. It is done after you are given a medicine to make you fall asleep (general anesthetic). The joint may also be injected with salt water at high pressure to break down scarring.  Surgery. This may be done in severe cases when other treatments have failed. Although most people recover completely from adhesive capsulitis, some may not regain the full movement of the shoulder. Follow these instructions at home:  Take over-the-counter and prescription medicines only as told by your health care provider.  If you are being treated with physical therapy, follow instructions from your physical therapist.  Avoid exercises that put a lot of demand on your shoulder, such as throwing. These exercises can make pain worse.  If directed, apply ice to the injured area:  Put ice in a plastic bag.  Place a towel between your skin and the bag.  Leave the ice on for 20 minutes, 2-3 times per day. Contact a health care provider if:  You develop new symptoms.  Your symptoms get worse. This information is not intended to replace advice given to you by  your health care provider. Make sure you discuss any questions you have with your health care provider. Document Released: 05/02/2009 Document Revised: 12/11/2015 Document Reviewed: 10/28/2014  2017 Elsevier   How to Take Your Blood Pressure Blood pressure is a measurement of how strongly your blood is pressing against the walls of your arteries. Arteries are blood vessels that carry blood from your heart throughout your body. Your health care provider takes your blood pressure at each office visit. You can also take your own blood pressure at home with a blood pressure machine. You may need to take your own blood pressure:  To confirm a diagnosis of high blood pressure (hypertension).  To monitor your blood pressure over time.  To make sure your blood pressure medicine is working. Supplies needed: To take your blood pressure, you will need a blood pressure machine. You can buy a blood pressure machine, or blood pressure monitor, at most drugstores or online. There are several types of home blood pressure monitors. When choosing one, consider the following:  Choose a monitor that has an arm cuff.  Choose a monitor that wraps snugly around your upper arm. You should be able to fit only one finger between your arm and the cuff.  Do not choose a monitor that measures your blood pressure from your wrist or finger. Your health care provider can suggest a reliable monitor that will meet your needs. How to prepare To get the most accurate reading, avoid the following for 30 minutes before you check your blood pressure:  Drinking caffeine.  Drinking alcohol.  Eating.  Smoking.  Exercising. Five minutes before you check your blood pressure:  Empty your bladder.  Sit quietly without talking in a dining chair, rather than in a soft couch or armchair. How to take your blood pressure To check your blood pressure, follow the instructions in the manual that came with your blood pressure  monitor. If you have a digital blood pressure monitor, the instructions may be as follows: 1. Sit up straight. 2. Place your feet on the floor. Do  not cross your ankles or legs. 3. Rest your left arm at the level of your heart on a table or desk or on the arm of a chair. 4. Pull up your shirt sleeve. 5. Wrap the blood pressure cuff around the upper part of your left arm, 1 inch (2.5 cm) above your elbow. It is best to wrap the cuff around bare skin. 6. Fit the cuff snugly around your arm. You should be able to place only one finger between the cuff and your arm. 7. Position the cord inside the groove of your elbow. 8. Press the power button. 9. Sit quietly while the cuff inflates and deflates. 10. Read the digital reading on the monitor screen and write it down (record it). 11. Wait 2-3 minutes, then repeat the steps starting at step 1. What does my blood pressure reading mean? A blood pressure reading is recorded as two numbers, such as "120 over 80" (or 120/80). The first ("top") number is called the systolic pressure. It is a measure of the pressure in your arteries as the heart beats. The second ("bottom") number is called the diastolic pressure. It is a measure of the pressure in your arteries as the heart relaxes between beats. Blood pressure is classified into four stages. The following are the stages for adults who do not have a short-term serious illness or a chronic condition. Systolic pressure and diastolic pressure are measured in a unit called mm Hg. Normal  Systolic pressure: below 123456.  Diastolic pressure: below 80. Prehypertension  Systolic pressure: 123456.  Diastolic pressure: [id number]. Hypertension stage 1  Systolic pressure: A999333.  Diastolic pressure: A999333. Hypertension stage 2  Systolic pressure: 0000000 or above.  Diastolic pressure: 123XX123 or above. You can have prehypertension or hypertension even if only the systolic or only the diastolic number in your reading  is higher than normal. Follow these instructions at home:  Check your blood pressure as often as recommended by your health care provider.  Take your monitor to the next appointment with your health care provider to make sure:  That you are using it correctly.  That it provides accurate readings.  Be sure you understand what your goal blood pressure numbers are.  Tell your health care provider if you are having any side effects from blood pressure medicine. Contact a health care provider if:  Your blood pressure is consistently high. Get help right away if:  Your systolic blood pressure is higher than 180.  Your diastolic blood pressure is higher than 110. This information is not intended to replace advice given to you by your health care provider. Make sure you discuss any questions you have with your health care provider. Document Released: 12/12/2015 Document Revised: 02/24/2016 Document Reviewed: 12/12/2015 Elsevier Interactive Patient Education  2017 [employer].    IF you received an x-ray today, you will receive an invoice from [facility]. Please contact [facility] at [phone] with questions or concerns regarding your invoice.   IF you received labwork today, you will receive an invoice from [facility]. Please contact [facility] at [phone] with questions or concerns regarding your invoice.   Our billing staff will not be able to assist you with questions regarding bills from these companies.  You will be contacted with the lab results as soon as they are available. The fastest way to get your results is to activate your My Chart account. Instructions are located on the last page of this paperwork. If you have not heard from [place]  regarding the results in 2 weeks, please contact this office.      I personally performed the services described in this documentation, which was scribed in my presence. The recorded information has been reviewed and  considered for accuracy and completeness, addended by me as needed, and agree with information above.  Signed,   [name], MD [facility].  09/15/16 5:16 PM

## 2016-09-13 NOTE — Patient Instructions (Addendum)
Your shoulder pain has some findings of bursitis and possible frozen shoulder. I will refer you to orthopedist for further evaluation. Ibuprofen 400-600mg  up to every 6 hours as needed for pain, but watch blood pressures with taking NSAIDs. Would also like to avoid long term NSAIDs as risk for heart issues with that class of medications. If pain is worsening prior to evaluation with orthopedics, return for possible injection as we discussed.  No change in blood pressure medications for now. Keep a record of your blood pressures at home, measuring it as discussed below. If remaining over 140/90, return to discuss medication changes. Schedule follow-up for physical within the next 3-6 months.  Shingles vaccine can be filled/given at your pharmacy.   Adhesive Capsulitis Introduction Adhesive capsulitis is inflammation of the tendons and ligaments that surround the shoulder joint (shoulder capsule). This condition causes the shoulder to become stiff and painful to move. Adhesive capsulitis is also called frozen shoulder. What are the causes? This condition may be caused by:  An injury to the shoulder joint.  Straining the shoulder.  Not moving the shoulder for a period of time. This can happen if your arm was injured or in a sling.  Long-standing health problems, such as:  Diabetes.  Thyroid problems.  Heart disease.  Stroke.  Rheumatoid arthritis.  Lung disease. In some cases, the cause may not be known. What increases the risk? This condition is more likely to develop in:  Women.  People who are older than 57 years of age. What are the signs or symptoms? Symptoms of this condition include:  Pain in the shoulder when moving the arm. There may also be pain when parts of the shoulder are touched. The pain is worse at night or when at rest.  Soreness or aching in the shoulder.  Inability to move the shoulder normally.  Muscle spasms. How is this diagnosed? This condition  is diagnosed with a physical exam and imaging tests, such as an X-ray or MRI. How is this treated? This condition may be treated with:  Treatment of the underlying cause or condition.  Physical therapy. This involves performing exercises to get the shoulder moving again.  Medicine. Medicine may be given to relieve pain, inflammation, or muscle spasms.  Steroid injections into the shoulder joint.  Shoulder manipulation. This is a procedure to move the shoulder into another position. It is done after you are given a medicine to make you fall asleep (general anesthetic). The joint may also be injected with salt water at high pressure to break down scarring.  Surgery. This may be done in severe cases when other treatments have failed. Although most people recover completely from adhesive capsulitis, some may not regain the full movement of the shoulder. Follow these instructions at home:  Take over-the-counter and prescription medicines only as told by your health care provider.  If you are being treated with physical therapy, follow instructions from your physical therapist.  Avoid exercises that put a lot of demand on your shoulder, such as throwing. These exercises can make pain worse.  If directed, apply ice to the injured area:  Put ice in a plastic bag.  Place a towel between your skin and the bag.  Leave the ice on for 20 minutes, 2-3 times per day. Contact a health care provider if:  You develop new symptoms.  Your symptoms get worse. This information is not intended to replace advice given to you by your health care provider. Make sure you discuss  any questions you have with your health care provider. Document Released: 05/02/2009 Document Revised: 12/11/2015 Document Reviewed: 10/28/2014  2017 Elsevier   How to Take Your Blood Pressure Blood pressure is a measurement of how strongly your blood is pressing against the walls of your arteries. Arteries are blood vessels  that carry blood from your heart throughout your body. Your health care provider takes your blood pressure at each office visit. You can also take your own blood pressure at home with a blood pressure machine. You may need to take your own blood pressure:  To confirm a diagnosis of high blood pressure (hypertension).  To monitor your blood pressure over time.  To make sure your blood pressure medicine is working. Supplies needed: To take your blood pressure, you will need a blood pressure machine. You can buy a blood pressure machine, or blood pressure monitor, at most drugstores or online. There are several types of home blood pressure monitors. When choosing one, consider the following:  Choose a monitor that has an arm cuff.  Choose a monitor that wraps snugly around your upper arm. You should be able to fit only one finger between your arm and the cuff.  Do not choose a monitor that measures your blood pressure from your wrist or finger. Your health care provider can suggest a reliable monitor that will meet your needs. How to prepare To get the most accurate reading, avoid the following for 30 minutes before you check your blood pressure:  Drinking caffeine.  Drinking alcohol.  Eating.  Smoking.  Exercising. Five minutes before you check your blood pressure:  Empty your bladder.  Sit quietly without talking in a dining chair, rather than in a soft couch or armchair. How to take your blood pressure To check your blood pressure, follow the instructions in the manual that came with your blood pressure monitor. If you have a digital blood pressure monitor, the instructions may be as follows: 1. Sit up straight. 2. Place your feet on the floor. Do not cross your ankles or legs. 3. Rest your left arm at the level of your heart on a table or desk or on the arm of a chair. 4. Pull up your shirt sleeve. 5. Wrap the blood pressure cuff around the upper part of your left arm, 1 inch  (2.5 cm) above your elbow. It is best to wrap the cuff around bare skin. 6. Fit the cuff snugly around your arm. You should be able to place only one finger between the cuff and your arm. 7. Position the cord inside the groove of your elbow. 8. Press the power button. 9. Sit quietly while the cuff inflates and deflates. 10. Read the digital reading on the monitor screen and write it down (record it). 11. Wait 2-3 minutes, then repeat the steps starting at step 1. What does my blood pressure reading mean? A blood pressure reading is recorded as two numbers, such as "120 over 80" (or 120/80). The first ("top") number is called the systolic pressure. It is a measure of the pressure in your arteries as the heart beats. The second ("bottom") number is called the diastolic pressure. It is a measure of the pressure in your arteries as the heart relaxes between beats. Blood pressure is classified into four stages. The following are the stages for adults who do not have a short-term serious illness or a chronic condition. Systolic pressure and diastolic pressure are measured in a unit called mm Hg. Normal  Systolic pressure: below 123456.  Diastolic pressure: below 80. Prehypertension  Systolic pressure: 123456.  Diastolic pressure: [id number]. Hypertension stage 1  Systolic pressure: A999333.  Diastolic pressure: A999333. Hypertension stage 2  Systolic pressure: 0000000 or above.  Diastolic pressure: 123XX123 or above. You can have prehypertension or hypertension even if only the systolic or only the diastolic number in your reading is higher than normal. Follow these instructions at home:  Check your blood pressure as often as recommended by your health care provider.  Take your monitor to the next appointment with your health care provider to make sure:  That you are using it correctly.  That it provides accurate readings.  Be sure you understand what your goal blood pressure numbers are.  Tell  your health care provider if you are having any side effects from blood pressure medicine. Contact a health care provider if:  Your blood pressure is consistently high. Get help right away if:  Your systolic blood pressure is higher than 180.  Your diastolic blood pressure is higher than 110. This information is not intended to replace advice given to you by your health care provider. Make sure you discuss any questions you have with your health care provider. Document Released: 12/12/2015 Document Revised: 02/24/2016 Document Reviewed: 12/12/2015 Elsevier Interactive Patient Education  2017 [employer].    IF you received an x-ray today, you will receive an invoice from [facility]. Please contact [facility] at [phone] with questions or concerns regarding your invoice.   IF you received labwork today, you will receive an invoice from [facility]. Please contact [facility] at [phone] with questions or concerns regarding your invoice.   Our billing staff will not be able to assist you with questions regarding bills from these companies.  You will be contacted with the lab results as soon as they are available. The fastest way to get your results is to activate your My Chart account. Instructions are located on the last page of this paperwork. If you have not heard from [place] regarding the results in 2 weeks, please contact this office.

## 2016-09-14 LAB — COMPREHENSIVE METABOLIC PANEL WITH GFR
ALT: 96 IU/L — ABNORMAL HIGH (ref 0–44)
AST: 45 IU/L — ABNORMAL HIGH (ref 0–40)
Albumin/Globulin Ratio: 1.6 (ref 1.2–2.2)
Albumin: 4.6 g/dL (ref 3.5–5.5)
Alkaline Phosphatase: 75 IU/L (ref 39–117)
BUN/Creatinine Ratio: 22 — ABNORMAL HIGH (ref 9–20)
BUN: 20 mg/dL (ref 6–24)
Bilirubin Total: 0.5 mg/dL (ref 0.0–1.2)
CO2: 20 mmol/L (ref 18–29)
Calcium: 10 mg/dL (ref 8.7–10.2)
Chloride: 98 mmol/L (ref 96–106)
Creatinine, Ser: 0.93 mg/dL (ref 0.76–1.27)
GFR calc Af Amer: 106 mL/min/1.73
GFR calc non Af Amer: 91 mL/min/1.73
Globulin, Total: 2.8 g/dL (ref 1.5–4.5)
Glucose: 108 mg/dL — ABNORMAL HIGH (ref 65–99)
Potassium: 4.6 mmol/L (ref 3.5–5.2)
Sodium: 134 mmol/L (ref 134–144)
Total Protein: 7.4 g/dL (ref 6.0–8.5)

## 2016-09-24 ENCOUNTER — Telehealth: Payer: Self-pay | Admitting: Family Medicine

## 2016-09-24 DIAGNOSIS — I10 Essential (primary) hypertension: Secondary | ICD-10-CM

## 2016-09-24 MED ORDER — LISINOPRIL 10 MG PO TABS: ORAL_TABLET | ORAL | 1 refills | Status: DC

## 2016-09-24 MED ORDER — METOPROLOL SUCCINATE ER 25 MG PO TB24: ORAL_TABLET | ORAL | 1 refills | Status: DC

## 2016-09-24 NOTE — Telephone Encounter (Signed)
Pt states that he gave [place] wrong information about his mail order its [facility] please send his mexdicine there

## 2016-09-24 NOTE — Telephone Encounter (Signed)
The medicine that he need sent to [facility] is the Lisinopril and Metoprolol

## 2016-10-26 ENCOUNTER — Ambulatory Visit (INDEPENDENT_AMBULATORY_CARE_PROVIDER_SITE_OTHER): Payer: BC Managed Care – PPO | Admitting: Family Medicine

## 2016-10-26 VITALS — BP 133/75 | HR 71 | Temp 98.6°F | Resp 17 | Ht 75.0 in | Wt 295.0 lb

## 2016-10-26 DIAGNOSIS — J320 Chronic maxillary sinusitis: Secondary | ICD-10-CM | POA: Diagnosis not present

## 2016-10-26 DIAGNOSIS — R062 Wheezing: Secondary | ICD-10-CM

## 2016-10-26 DIAGNOSIS — R05 Cough: Secondary | ICD-10-CM | POA: Diagnosis not present

## 2016-10-26 DIAGNOSIS — R059 Cough, unspecified: Secondary | ICD-10-CM

## 2016-10-26 MED ORDER — ALBUTEROL SULFATE HFA 108 (90 BASE) MCG/ACT IN AERS: 1.0000 | INHALATION_SPRAY | RESPIRATORY_TRACT | 0 refills | Status: DC | PRN

## 2016-10-26 MED ORDER — AMOXICILLIN-POT CLAVULANATE 875-125 MG PO TABS: 1.0000 | ORAL_TABLET | Freq: Two times a day (BID) | ORAL | 0 refills | Status: DC

## 2016-10-26 NOTE — Progress Notes (Signed)
Subjective:  By signing my name below, I, Essence Howell, attest that this documentation has been prepared under the direction and in the presence of [name], MD Electronically Signed: Ladene Artist, ED Scribe 10/26/2016 at 12:30 PM.   Patient ID: Nathaniel Velazquez, male    DOB: Apr 20, 1961, 56 y.o.   MRN: [id number]  Chief Complaint  Patient presents with  . Sinusitis  . URI   HPI Nathaniel Velazquez is a 56 y.o. male who presents to Primary Care at [facility] complaining of gradually worsening sinus pressure over the past few days. Pt states that symptoms began as chest congestion 1 week ago. He reports associated symptoms of rhinorrhea, sneezing, cough, right ear pain, worsening HAs, dark green colored nasal congestion, wheezing. Pt has tried [employer] twice daily for the past few days and Mucinex but states it makes him jittery. Pt denies fevers. No h/o asthma but states that he has used an inhaler for similar symptoms in the past.  Patient Active Problem List   Diagnosis Date Noted  . Rectal pain 06/05/2015  . Hypertension   . Obesity   . Hyperlipidemia 11/21/2008  . PALPITATIONS 10/30/2008   Past Medical History:  Diagnosis Date  . GERD (gastroesophageal reflux disease)   . Hypertension   . Obesity    Steroid injection in rt knee   Past Surgical History:  Procedure Laterality Date  . ANAL FISTULECTOMY  1987  . APPENDECTOMY    . TONSILLECTOMY     Allergies  Allergen Reactions  . Gabapentin    Prior to Admission medications   Medication Sig Start Date End Date Taking? Authorizing Provider  clobetasol ointment (TEMOVATE) 0.05 % As directed 05/15/12  Yes Historical Provider, MD  Ibuprofen (ADVIL PO) Take by mouth daily.   Yes Historical Provider, MD  lisinopril (PRINIVIL,ZESTRIL) 10 MG tablet TAKE 1 BY MOUTH DAILY 09/24/16  Yes [name], MD  metoprolol succinate (TOPROL-XL) 25 MG 24 hr tablet TAKE 1 BY MOUTH DAILY. 09/24/16  Yes [name], MD    nystatin-triamcinolone ointment (MYCOLOG) APPLY SPARINGLY TO THE AFFECTED AREA FOR UP TO 7 DAYS 11/18/15  Yes Elby Beck, FNP  sildenafil (VIAGRA) 100 MG tablet Take 0.5-1 tablets (50-100 mg total) by mouth daily as needed for erectile dysfunction. 02/19/16  Yes [name], MD   Social History   Social History  . Marital status: Married    Spouse name: N/A  . Number of children: N/A  . Years of education: N/A   Occupational History  . Not on file.   Social History Main Topics  . Smoking status: Never Smoker  . Smokeless tobacco: Never Used  . Alcohol use 4.2 oz/week    7 Standard drinks or equivalent per week  . Drug use: No  . Sexual activity: No   Other Topics Concern  . Not on file   Social History Narrative   Sales rep   Review of Systems  Constitutional: Negative for fever.  HENT: Positive for congestion, ear pain, rhinorrhea, sinus pain, sinus pressure and sneezing.   Respiratory: Positive for cough and wheezing.   Neurological: Positive for headaches.      Objective:   Physical Exam  Constitutional: He is oriented to person, place, and time. He appears well-developed and well-nourished.  HENT:  Head: Normocephalic and atraumatic.  Right Ear: Tympanic membrane, external ear and ear canal normal.  Left Ear: Tympanic membrane, external ear and ear canal normal.  Nose: No  rhinorrhea. Right sinus exhibits maxillary sinus tenderness. Right sinus exhibits no frontal sinus tenderness.  Mouth/Throat: Oropharynx is clear and moist and mucous membranes are normal. No oropharyngeal exudate or posterior oropharyngeal erythema.  Eyes: Conjunctivae are normal. Pupils are equal, round, and reactive to light.  Neck: Neck supple.  Cardiovascular: Normal rate, regular rhythm, normal heart sounds and intact distal pulses.   No murmur heard. Pulmonary/Chest: Effort normal. He has wheezes (end expiratory wheeze). He has no rhonchi. He has no rales.  Abdominal: Soft. There  is no tenderness.  Lymphadenopathy:    He has no cervical adenopathy.  Neurological: He is alert and oriented to person, place, and time.  Skin: Skin is warm and dry. No rash noted.  Psychiatric: He has a normal mood and affect. His behavior is normal.  Vitals reviewed.  Vitals:   10/26/16 1159  BP: 133/75  Pulse: 71  Resp: 17  Temp: 98.6 F (37 C)  TempSrc: Oral  SpO2: 97%  Weight: 295 lb (133.8 kg)  Height: 6\' 3"  (1.905 m)      Assessment & Plan:    Nathaniel Velazquez is a 56 y.o. male Cough, Wheezing - Plan: albuterol (PROVENTIL HFA;VENTOLIN HFA) 108 (90 Base) MCG/ACT inhaler  -Suspected viral illness with secondary bronchospasm. Albuterol inhaler refilled, RTC precautions if frequent or persistent need.  Right maxillary sinusitis - Plan: amoxicillin-clavulanate (AUGMENTIN) 875-125 MG tablet  -Likely initial viral illness, now with worsening right sinus pressure, pain, and now discolored nasal discharge. Possible early sinusitis.   -Start Augmentin(potential side effects discussed) , continue saline nasal spray or Neti Pot, RTC precautions   Meds ordered this encounter  Medications  . albuterol (PROVENTIL HFA;VENTOLIN HFA) 108 (90 Base) MCG/ACT inhaler    Sig: Inhale 1-2 puffs into the lungs every 4 (four) hours as needed for wheezing or shortness of breath.    Dispense:  1 Inhaler    Refill:  0  . amoxicillin-clavulanate (AUGMENTIN) 875-125 MG tablet    Sig: Take 1 tablet by mouth 2 (two) times daily.    Dispense:  20 tablet    Refill:  0   Patient Instructions    Although your symptoms initially likely started as a virus, the worsening right sinus symptoms may be due to a bacteria. For that reason, start Augmentin 1 pill twice per day for 10 days, continue saline nasal spray or netipot.    I did hear some wheezing on your lungs today. You can use albuterol inhaler 1-2 puffs up to every 4-6 hours as needed. If you require the inhaler more than 2-3 times per day, or  persistently need that inhaler frequently in the next 3 days, recommend recheck to determine if other medication is needed.  If you have worsening shortness of breath, fevers, or other worsening symptoms, recommend recheck for possible chest x-ray or blood work.  Return to the clinic or go to the nearest emergency room if any of your symptoms worsen or new symptoms occur.   Sinusitis, Adult Sinusitis is soreness and inflammation of your sinuses. Sinuses are hollow spaces in the bones around your face. Your sinuses are located:  Around your eyes.  In the middle of your forehead.  Behind your nose.  In your cheekbones. Your sinuses and nasal passages are lined with a stringy fluid (mucus). Mucus normally drains out of your sinuses. When your nasal tissues become inflamed or swollen, the mucus can become trapped or blocked so air cannot flow through your sinuses. This allows  bacteria, viruses, and funguses to grow, which leads to infection. Sinusitis can develop quickly and last for 7?10 days (acute) or for more than 12 weeks (chronic). Sinusitis often develops after a cold. What are the causes? This condition is caused by anything that creates swelling in the sinuses or stops mucus from draining, including:  Allergies.  Asthma.  Bacterial or viral infection.  Abnormally shaped bones between the nasal passages.  Nasal growths that contain mucus (nasal polyps).  Narrow sinus openings.  Pollutants, such as chemicals or irritants in the air.  A foreign object stuck in the nose.  A fungal infection. This is rare. What increases the risk? The following factors may make you more likely to develop this condition:  Having allergies or asthma.  Having had a recent cold or respiratory tract infection.  Having structural deformities or blockages in your nose or sinuses.  Having a weak immune system.  Doing a lot of swimming or diving.  Overusing nasal sprays.  Smoking. What  are the signs or symptoms? The main symptoms of this condition are pain and a feeling of pressure around the affected sinuses. Other symptoms include:  Upper toothache.  Earache.  Headache.  Bad breath.  Decreased sense of smell and taste.  A cough that may get worse at night.  Fatigue.  Fever.  Thick drainage from your nose. The drainage is often green and it may contain pus (purulent).  Stuffy nose or congestion.  Postnasal drip. This is when extra mucus collects in the throat or back of the nose.  Swelling and warmth over the affected sinuses.  Sore throat.  Sensitivity to light. How is this diagnosed? This condition is diagnosed based on symptoms, a medical history, and a physical exam. To find out if your condition is acute or chronic, your health care provider may:  Look in your nose for signs of nasal polyps.  Tap over the affected sinus to check for signs of infection.  View the inside of your sinuses using an imaging device that has a light attached (endoscope). If your health care provider suspects that you have chronic sinusitis, you may also:  Be tested for allergies.  Have a sample of mucus taken from your nose (nasal culture) and checked for bacteria.  Have a mucus sample examined to see if your sinusitis is related to an allergy. If your sinusitis does not respond to treatment and it lasts longer than 8 weeks, you may have an MRI or CT scan to check your sinuses. These scans also help to determine how severe your infection is. In rare cases, a bone biopsy may be done to rule out more serious types of fungal sinus disease. How is this treated? Treatment for sinusitis depends on the cause and whether your condition is chronic or acute. If a virus is causing your sinusitis, your symptoms will go away on their own within 10 days. You may be given medicines to relieve your symptoms, including:  Topical nasal decongestants. They shrink swollen nasal passages  and let mucus drain from your sinuses.  Antihistamines. These drugs block inflammation that is triggered by allergies. This can help to ease swelling in your nose and sinuses.  Topical nasal corticosteroids. These are nasal sprays that ease inflammation and swelling in your nose and sinuses.  Nasal saline washes. These rinses can help to get rid of thick mucus in your nose. If your condition is caused by bacteria, you will be given an antibiotic medicine. If your condition  is caused by a fungus, you will be given an antifungal medicine. Surgery may be needed to correct underlying conditions, such as narrow nasal passages. Surgery may also be needed to remove polyps. Follow these instructions at home: Medicines   Take, use, or apply over-the-counter and prescription medicines only as told by your health care provider. These may include nasal sprays.  If you were prescribed an antibiotic medicine, take it as told by your health care provider. Do not stop taking the antibiotic even if you start to feel better. Hydrate and Humidify   Drink enough water to keep your urine clear or pale yellow. Staying hydrated will help to thin your mucus.  Use a cool mist humidifier to keep the humidity level in your home above 50%.  Inhale steam for 10-15 minutes, 3-4 times a day or as told by your health care provider. You can do this in the bathroom while a hot shower is running.  Limit your exposure to cool or dry air. Rest   Rest as much as possible.  Sleep with your head raised (elevated).  Make sure to get enough sleep each night. General instructions   Apply a warm, moist washcloth to your face 3-4 times a day or as told by your health care provider. This will help with discomfort.  Wash your hands often with soap and water to reduce your exposure to viruses and other germs. If soap and water are not available, use hand sanitizer.  Do not smoke. Avoid being around people who are smoking  (secondhand smoke).  Keep all follow-up visits as told by your health care provider. This is important. Contact a health care provider if:  You have a fever.  Your symptoms get worse.  Your symptoms do not improve within 10 days. Get help right away if:  You have a severe headache.  You have persistent vomiting.  You have pain or swelling around your face or eyes.  You have vision problems.  You develop confusion.  Your neck is stiff.  You have trouble breathing. This information is not intended to replace advice given to you by your health care provider. Make sure you discuss any questions you have with your health care provider. Document Released: 07/05/2005 Document Revised: 02/29/2016 Document Reviewed: 04/30/2015 Elsevier Interactive Patient Education  2017 [employer] Inc[employer]   Bronchospasm, Adult Bronchospasm is a tightening of the airways going into the lungs. During an episode, it may be harder to breathe. You may cough, and you may make a whistling sound when you breathe (wheeze). This condition often affects people with asthma. What are the causes? This condition is caused by swelling and irritation in the airways. It can be triggered by: An infection (common). Seasonal allergies. An allergic reaction. Exercise. Irritants. These include pollution, cigarette smoke, strong odors, aerosol sprays, and paint fumes. Weather changes. Winds increase molds and pollens in the air. Cold air may cause swelling. Stress and emotional upset. What are the signs or symptoms? Symptoms of this condition include: Wheezing. If the episode was triggered by an allergy, wheezing may start right away or hours later. Nighttime coughing. Frequent or severe coughing with a simple cold. Chest tightness. Shortness of breath. Decreased ability to exercise. How is this diagnosed? This condition is usually diagnosed with a review of your medical history and a physical exam. Tests, such as  lung function tests, are sometimes done to look for other conditions. The need for a chest X-ray depends on where the wheezing occurs and  whether it is the first time you have wheezed. How is this treated? This condition may be treated with: Inhaled medicines. These open up the airways and help you breathe. They can be taken with an inhaler or a nebulizer device. Corticosteroid medicines. These may be given for severe bronchospasm, usually when it is associated with asthma. Avoiding triggers, such as irritants, infection, or allergies. Follow these instructions at home: Medicines  Take over-the-counter and prescription medicines only as told by your health care provider. If you need to use an inhaler or nebulizer to take your medicine, ask your health care provider to explain how to use it correctly. If you were given a spacer, always use it with your inhaler. Lifestyle  Reduce the number of triggers in your home. To do this: Change your heating and air conditioning filter at least once a month. Limit your use of fireplaces and wood stoves. Do not smoke. Do not allow smoking in your home. Avoid using perfumes and fragrances. Get rid of pests, such as roaches and mice, and their droppings. Remove any mold from your home. Keep your house clean and dust free. Use unscented cleaning products. Replace carpet with wood, tile, or vinyl flooring. Carpet can trap dander and dust. Use allergy-proof pillows, mattress covers, and box spring covers. Wash bed sheets and blankets every week in hot water. Dry them in a dryer. Use blankets that are made of polyester or cotton. Wash your hands often. Do not allow pets in your bedroom. Avoid breathing in cold air when you exercise. General instructions  Have a plan for seeking medical care. Know when to call your health care provider and local emergency services, and where to get emergency care. Stay up to date on your immunizations. When you have an  episode of bronchospasm, stay calm. Try to relax and breathe more slowly. If you have asthma, make sure you have an asthma action plan. Keep all follow-up visits as told by your health care provider. This is important. Contact a health care provider if: You have muscle aches. You have chest pain. The mucus that you cough up (sputum) changes from clear or white to yellow, green, gray, or bloody. You have a fever. Your sputum gets thicker. Get help right away if: Your wheezing and coughing get worse, even after you take your prescribed medicines. It gets even harder to breathe. You develop severe chest pain. Summary Bronchospasm is a tightening of the airways going into the lungs. During an episode of bronchospasm, you may have a harder time breathing. You may cough and make a whistling sound when you breathe (wheeze). Avoid exposure to triggers such as smoke, dust, mold, animal dander, and fragrances. When you have an episode of bronchospasm, stay calm. Try to relax and breathe more slowly. This information is not intended to replace advice given to you by your health care provider. Make sure you discuss any questions you have with your health care provider. Document Released: 07/08/2003 Document Revised: 07/01/2016 Document Reviewed: 07/01/2016 Elsevier Interactive Patient Education  2017 Else[employer] Inc.  Cough, Adult Coughing is a reflex that clears your throat and your airways. Coughing helps to heal and protect your lungs. It is normal to cough occasionally, but a cough that happens with other symptoms or lasts a long time may be a sign of a condition that needs treatment. A cough may last only 2-3 weeks (acute), or it may last longer than 8 weeks (chronic). What are the causes? Coughing is commonly caused by:  Breathing in substances that irritate your lungs.  A viral or bacterial respiratory infection.  Allergies.  Asthma.  Postnasal drip.  Smoking.  Acid backing up from  the stomach into the esophagus (gastroesophageal reflux).  Certain medicines.  Chronic lung problems, including COPD (or rarely, lung cancer).  Other medical conditions such as heart failure. Follow these instructions at home: Pay attention to any changes in your symptoms. Take these actions to help with your discomfort:  Take medicines only as told by your health care provider.  If you were prescribed an antibiotic medicine, take it as told by your health care provider. Do not stop taking the antibiotic even if you start to feel better.  Talk with your health care provider before you take a cough suppressant medicine.  Drink enough fluid to keep your urine clear or pale yellow.  If the air is dry, use a cold steam vaporizer or humidifier in your bedroom or your home to help loosen secretions.  Avoid anything that causes you to cough at work or at home.  If your cough is worse at night, try sleeping in a semi-upright position.  Avoid cigarette smoke. If you smoke, quit smoking. If you need help quitting, ask your health care provider.  Avoid caffeine.  Avoid alcohol.  Rest as needed. Contact a health care provider if:  You have new symptoms.  You cough up pus.  Your cough does not get better after 2-3 weeks, or your cough gets worse.  You cannot control your cough with suppressant medicines and you are losing sleep.  You develop pain that is getting worse or pain that is not controlled with pain medicines.  You have a fever.  You have unexplained weight loss.  You have night sweats. Get help right away if:  You cough up blood.  You have difficulty breathing.  Your heartbeat is very fast. This information is not intended to replace advice given to you by your health care provider. Make sure you discuss any questions you have with your health care provider. Document Released: 01/01/2011 Document Revised: 12/11/2015 Document Reviewed: 09/11/2014 Elsevier  Interactive Patient Education  2017 [employer].   IF you received an x-ray today, you will receive an invoice from [facility]. Please contact [facility] at [phone] with questions or concerns regarding your invoice.   IF you received labwork today, you will receive an invoice from [facility]. Please contact [facility] at [phone] with questions or concerns regarding your invoice.   Our billing staff will not be able to assist you with questions regarding bills from these companies.  You will be contacted with the lab results as soon as they are available. The fastest way to get your results is to activate your My Chart account. Instructions are located on the last page of this paperwork. If you have not heard from [place] regarding the results in 2 weeks, please contact this office.       I personally performed the services described in this documentation, which was scribed in my presence. The recorded information has been reviewed and considered for accuracy and completeness, addended by me as needed, and agree with information above.  Signed,   [name], MD [facility].  10/27/16 3:21 PM

## 2016-10-26 NOTE — Patient Instructions (Addendum)
Although your symptoms initially likely started as a virus, the worsening right sinus symptoms may be due to a bacteria. For that reason, start Augmentin 1 pill twice per day for 10 days, continue saline nasal spray or netipot.    I did hear some wheezing on your lungs today. You can use albuterol inhaler 1-2 puffs up to every 4-6 hours as needed. If you require the inhaler more than 2-3 times per day, or persistently need that inhaler frequently in the next 3 days, recommend recheck to determine if other medication is needed.  If you have worsening shortness of breath, fevers, or other worsening symptoms, recommend recheck for possible chest x-ray or blood work.  Return to the clinic or go to the nearest emergency room if any of your symptoms worsen or new symptoms occur.   Sinusitis, Adult Sinusitis is soreness and inflammation of your sinuses. Sinuses are hollow spaces in the bones around your face. Your sinuses are located:  Around your eyes.  In the middle of your forehead.  Behind your nose.  In your cheekbones. Your sinuses and nasal passages are lined with a stringy fluid (mucus). Mucus normally drains out of your sinuses. When your nasal tissues become inflamed or swollen, the mucus can become trapped or blocked so air cannot flow through your sinuses. This allows bacteria, viruses, and funguses to grow, which leads to infection. Sinusitis can develop quickly and last for 7?10 days (acute) or for more than 12 weeks (chronic). Sinusitis often develops after a cold. What are the causes? This condition is caused by anything that creates swelling in the sinuses or stops mucus from draining, including:  Allergies.  Asthma.  Bacterial or viral infection.  Abnormally shaped bones between the nasal passages.  Nasal growths that contain mucus (nasal polyps).  Narrow sinus openings.  Pollutants, such as chemicals or irritants in the air.  A foreign object stuck in the  nose.  A fungal infection. This is rare. What increases the risk? The following factors may make you more likely to develop this condition:  Having allergies or asthma.  Having had a recent cold or respiratory tract infection.  Having structural deformities or blockages in your nose or sinuses.  Having a weak immune system.  Doing a lot of swimming or diving.  Overusing nasal sprays.  Smoking. What are the signs or symptoms? The main symptoms of this condition are pain and a feeling of pressure around the affected sinuses. Other symptoms include:  Upper toothache.  Earache.  Headache.  Bad breath.  Decreased sense of smell and taste.  A cough that may get worse at night.  Fatigue.  Fever.  Thick drainage from your nose. The drainage is often green and it may contain pus (purulent).  Stuffy nose or congestion.  Postnasal drip. This is when extra mucus collects in the throat or back of the nose.  Swelling and warmth over the affected sinuses.  Sore throat.  Sensitivity to light. How is this diagnosed? This condition is diagnosed based on symptoms, a medical history, and a physical exam. To find out if your condition is acute or chronic, your health care provider may:  Look in your nose for signs of nasal polyps.  Tap over the affected sinus to check for signs of infection.  View the inside of your sinuses using an imaging device that has a light attached (endoscope). If your health care provider suspects that you have chronic sinusitis, you may also:  Be tested for  allergies.  Have a sample of mucus taken from your nose (nasal culture) and checked for bacteria.  Have a mucus sample examined to see if your sinusitis is related to an allergy. If your sinusitis does not respond to treatment and it lasts longer than 8 weeks, you may have an MRI or CT scan to check your sinuses. These scans also help to determine how severe your infection is. In rare cases, a  bone biopsy may be done to rule out more serious types of fungal sinus disease. How is this treated? Treatment for sinusitis depends on the cause and whether your condition is chronic or acute. If a virus is causing your sinusitis, your symptoms will go away on their own within 10 days. You may be given medicines to relieve your symptoms, including:  Topical nasal decongestants. They shrink swollen nasal passages and let mucus drain from your sinuses.  Antihistamines. These drugs block inflammation that is triggered by allergies. This can help to ease swelling in your nose and sinuses.  Topical nasal corticosteroids. These are nasal sprays that ease inflammation and swelling in your nose and sinuses.  Nasal saline washes. These rinses can help to get rid of thick mucus in your nose. If your condition is caused by bacteria, you will be given an antibiotic medicine. If your condition is caused by a fungus, you will be given an antifungal medicine. Surgery may be needed to correct underlying conditions, such as narrow nasal passages. Surgery may also be needed to remove polyps. Follow these instructions at home: Medicines   Take, use, or apply over-the-counter and prescription medicines only as told by your health care provider. These may include nasal sprays.  If you were prescribed an antibiotic medicine, take it as told by your health care provider. Do not stop taking the antibiotic even if you start to feel better. Hydrate and Humidify   Drink enough water to keep your urine clear or pale yellow. Staying hydrated will help to thin your mucus.  Use a cool mist humidifier to keep the humidity level in your home above 50%.  Inhale steam for 10-15 minutes, 3-4 times a day or as told by your health care provider. You can do this in the bathroom while a hot shower is running.  Limit your exposure to cool or dry air. Rest   Rest as much as possible.  Sleep with your head raised  (elevated).  Make sure to get enough sleep each night. General instructions   Apply a warm, moist washcloth to your face 3-4 times a day or as told by your health care provider. This will help with discomfort.  Wash your hands often with soap and water to reduce your exposure to viruses and other germs. If soap and water are not available, use hand sanitizer.  Do not smoke. Avoid being around people who are smoking (secondhand smoke).  Keep all follow-up visits as told by your health care provider. This is important. Contact a health care provider if:  You have a fever.  Your symptoms get worse.  Your symptoms do not improve within 10 days. Get help right away if:  You have a severe headache.  You have persistent vomiting.  You have pain or swelling around your face or eyes.  You have vision problems.  You develop confusion.  Your neck is stiff.  You have trouble breathing. This information is not intended to replace advice given to you by your health care provider. Make sure you  discuss any questions you have with your health care provider. Document Released: 07/05/2005 Document Revised: 02/29/2016 Document Reviewed: 04/30/2015 Elsevier Interactive Patient Education  2017 [employer]   Bronchospasm, Adult Bronchospasm is a tightening of the airways going into the lungs. During an episode, it may be harder to breathe. You may cough, and you may make a whistling sound when you breathe (wheeze). This condition often affects people with asthma. What are the causes? This condition is caused by swelling and irritation in the airways. It can be triggered by: An infection (common). Seasonal allergies. An allergic reaction. Exercise. Irritants. These include pollution, cigarette smoke, strong odors, aerosol sprays, and paint fumes. Weather changes. Winds increase molds and pollens in the air. Cold air may cause swelling. Stress and emotional upset. What are the signs or  symptoms? Symptoms of this condition include: Wheezing. If the episode was triggered by an allergy, wheezing may start right away or hours later. Nighttime coughing. Frequent or severe coughing with a simple cold. Chest tightness. Shortness of breath. Decreased ability to exercise. How is this diagnosed? This condition is usually diagnosed with a review of your medical history and a physical exam. Tests, such as lung function tests, are sometimes done to look for other conditions. The need for a chest X-ray depends on where the wheezing occurs and whether it is the first time you have wheezed. How is this treated? This condition may be treated with: Inhaled medicines. These open up the airways and help you breathe. They can be taken with an inhaler or a nebulizer device. Corticosteroid medicines. These may be given for severe bronchospasm, usually when it is associated with asthma. Avoiding triggers, such as irritants, infection, or allergies. Follow these instructions at home: Medicines  Take over-the-counter and prescription medicines only as told by your health care provider. If you need to use an inhaler or nebulizer to take your medicine, ask your health care provider to explain how to use it correctly. If you were given a spacer, always use it with your inhaler. Lifestyle  Reduce the number of triggers in your home. To do this: Change your heating and air conditioning filter at least once a month. Limit your use of fireplaces and wood stoves. Do not smoke. Do not allow smoking in your home. Avoid using perfumes and fragrances. Get rid of pests, such as roaches and mice, and their droppings. Remove any mold from your home. Keep your house clean and dust free. Use unscented cleaning products. Replace carpet with wood, tile, or vinyl flooring. Carpet can trap dander and dust. Use allergy-proof pillows, mattress covers, and box spring covers. Wash bed sheets and blankets every week in  hot water. Dry them in a dryer. Use blankets that are made of polyester or cotton. Wash your hands often. Do not allow pets in your bedroom. Avoid breathing in cold air when you exercise. General instructions  Have a plan for seeking medical care. Know when to call your health care provider and local emergency services, and where to get emergency care. Stay up to date on your immunizations. When you have an episode of bronchospasm, stay calm. Try to relax and breathe more slowly. If you have asthma, make sure you have an asthma action plan. Keep all follow-up visits as told by your health care provider. This is important. Contact a health care provider if: You have muscle aches. You have chest pain. The mucus that you cough up (sputum) changes from clear or white to yellow, green,  gray, or bloody. You have a fever. Your sputum gets thicker. Get help right away if: Your wheezing and coughing get worse, even after you take your prescribed medicines. It gets even harder to breathe. You develop severe chest pain. Summary Bronchospasm is a tightening of the airways going into the lungs. During an episode of bronchospasm, you may have a harder time breathing. You may cough and make a whistling sound when you breathe (wheeze). Avoid exposure to triggers such as smoke, dust, mold, animal dander, and fragrances. When you have an episode of bronchospasm, stay calm. Try to relax and breathe more slowly. This information is not intended to replace advice given to you by your health care provider. Make sure you discuss any questions you have with your health care provider. Document Released: 07/08/2003 Document Revised: 07/01/2016 Document Reviewed: 07/01/2016 Elsevier Interactive Patient Education  2017 [employer]  Cough, Adult Coughing is a reflex that clears your throat and your airways. Coughing helps to heal and protect your lungs. It is normal to cough occasionally, but a cough that  happens with other symptoms or lasts a long time may be a sign of a condition that needs treatment. A cough may last only 2-3 weeks (acute), or it may last longer than 8 weeks (chronic). What are the causes? Coughing is commonly caused by:  Breathing in substances that irritate your lungs.  A viral or bacterial respiratory infection.  Allergies.  Asthma.  Postnasal drip.  Smoking.  Acid backing up from the stomach into the esophagus (gastroesophageal reflux).  Certain medicines.  Chronic lung problems, including COPD (or rarely, lung cancer).  Other medical conditions such as heart failure. Follow these instructions at home: Pay attention to any changes in your symptoms. Take these actions to help with your discomfort:  Take medicines only as told by your health care provider.  If you were prescribed an antibiotic medicine, take it as told by your health care provider. Do not stop taking the antibiotic even if you start to feel better.  Talk with your health care provider before you take a cough suppressant medicine.  Drink enough fluid to keep your urine clear or pale yellow.  If the air is dry, use a cold steam vaporizer or humidifier in your bedroom or your home to help loosen secretions.  Avoid anything that causes you to cough at work or at home.  If your cough is worse at night, try sleeping in a semi-upright position.  Avoid cigarette smoke. If you smoke, quit smoking. If you need help quitting, ask your health care provider.  Avoid caffeine.  Avoid alcohol.  Rest as needed. Contact a health care provider if:  You have new symptoms.  You cough up pus.  Your cough does not get better after 2-3 weeks, or your cough gets worse.  You cannot control your cough with suppressant medicines and you are losing sleep.  You develop pain that is getting worse or pain that is not controlled with pain medicines.  You have a fever.  You have unexplained weight  loss.  You have night sweats. Get help right away if:  You cough up blood.  You have difficulty breathing.  Your heartbeat is very fast. This information is not intended to replace advice given to you by your health care provider. Make sure you discuss any questions you have with your health care provider. Document Released: 01/01/2011 Document Revised: 12/11/2015 Document Reviewed: 09/11/2014 Elsevier Interactive Patient Education  2017 [employer].  IF you received an x-ray today, you will receive an invoice from [facility]. Please contact [facility] at [phone] with questions or concerns regarding your invoice.   IF you received labwork today, you will receive an invoice from [facility]. Please contact [facility] at [phone] with questions or concerns regarding your invoice.   Our billing staff will not be able to assist you with questions regarding bills from these companies.  You will be contacted with the lab results as soon as they are available. The fastest way to get your results is to activate your My Chart account. Instructions are located on the last page of this paperwork. If you have not heard from us regarding the results in 2 weeks, please contact this office.      

## 2017-03-07 ENCOUNTER — Ambulatory Visit: Payer: BC Managed Care – PPO | Admitting: Family Medicine

## 2017-04-13 ENCOUNTER — Other Ambulatory Visit: Payer: Self-pay | Admitting: Family Medicine

## 2017-04-13 DIAGNOSIS — I10 Essential (primary) hypertension: Secondary | ICD-10-CM

## 2017-04-15 NOTE — Telephone Encounter (Signed)
Pt is calling to ask for refill on lisinopril (PRINIVIL,ZESTRIL) 10 MG tablet [[id number]] & metoprolol succinate (TOPROL-XL) 25 MG 24 hr tablet [[id number]]   Pharm: [facility]  Is about to run out of rx  [phone] please call to consult appointment scheduled for Wed. 10/4 4:20 pm with Dr. Carlota Raspberry

## 2017-04-21 ENCOUNTER — Encounter: Payer: Self-pay | Admitting: Family Medicine

## 2017-04-21 ENCOUNTER — Ambulatory Visit (INDEPENDENT_AMBULATORY_CARE_PROVIDER_SITE_OTHER): Payer: BC Managed Care – PPO | Admitting: Family Medicine

## 2017-04-21 VITALS — BP 138/80 | HR 66 | Temp 98.5°F | Resp 16 | Ht 75.0 in | Wt 291.6 lb

## 2017-04-21 DIAGNOSIS — Z13220 Encounter for screening for lipoid disorders: Secondary | ICD-10-CM

## 2017-04-21 DIAGNOSIS — R748 Abnormal levels of other serum enzymes: Secondary | ICD-10-CM

## 2017-04-21 DIAGNOSIS — I10 Essential (primary) hypertension: Secondary | ICD-10-CM | POA: Diagnosis not present

## 2017-04-21 DIAGNOSIS — N529 Male erectile dysfunction, unspecified: Secondary | ICD-10-CM

## 2017-04-21 MED ORDER — METOPROLOL SUCCINATE ER 25 MG PO TB24: 25.0000 mg | ORAL_TABLET | Freq: Every day | ORAL | 1 refills | Status: DC

## 2017-04-21 MED ORDER — LISINOPRIL 10 MG PO TABS: 10.0000 mg | ORAL_TABLET | Freq: Every day | ORAL | 1 refills | Status: DC

## 2017-04-21 MED ORDER — SILDENAFIL CITRATE 25 MG PO TABS: 25.0000 mg | ORAL_TABLET | Freq: Every day | ORAL | 5 refills | Status: DC | PRN

## 2017-04-21 NOTE — Patient Instructions (Addendum)
Return in the next few days for fasting labs only. Schedule physical in the next 6 months.  Keep up the good work with exercise most days per week, try the portion control and salad tricks we discussed as well as cutting back to 1 drink if possible to help with weight and liver. I will repeat liver tests today.      IF you received an x-ray today, you will receive an invoice from [facility]. Please contact [facility] at [phone] with questions or concerns regarding your invoice.   IF you received labwork today, you will receive an invoice from [facility]. Please contact [facility] at [phone] with questions or concerns regarding your invoice.   Our billing staff will not be able to assist you with questions regarding bills from these companies.  You will be contacted with the lab results as soon as they are available. The fastest way to get your results is to activate your My Chart account. Instructions are located on the last page of this paperwork. If you have not heard from [place] regarding the results in 2 weeks, please contact this office.

## 2017-04-21 NOTE — Progress Notes (Signed)
Subjective:  By signing my name below, I, Nathaniel Velazquez, attest that this documentation has been prepared under the direction and in the presence of Nathaniel Ray, MD. Electronically Signed: Moises Velazquez, [place]. 04/21/2017 , 5:14 PM .  Patient was seen in Room 11 .   Patient ID: Nathaniel Velazquez, male    DOB: 1960-11-25, 56 y.o.   MRN: [id number] Chief Complaint  Patient presents with  . Medication Refill    lisinopril, toprol Xl, viagra    HPI Nathaniel Velazquez is a 56 y.o. male Here for follow up. He is not fasting today, as his last meal was at 12:00PM noon today (5 hours ago).   HTN His BP was controlled at April visit. At that time, he was on lisinopril 10mg  QD and Toprol 25mg  QD.   Lab Results  Component Value Date   CREATININE 0.93 09/13/2016   He's been checking his BP at home, ranging in mid 120s/70s-80s. He denies any new side effects with his medications, or any persistent cough. He does mention having some lightheadedness if he stands up too fast after sitting in a certain position.   Exercise/Diet Wt Readings from Last 3 Encounters:  04/21/17 291 lb 9.6 oz (132.3 kg)  10/26/16 295 lb (133.8 kg)  09/13/16 292 lb (132.5 kg)   He's been exercising with walking about a mile a day, about 5 days a week. He reports having rotator cuff surgery in Spring of this year, which set him back in exercise.   He eats breakfast daily. He notes having history of hypoglycemia in his 68s, so he drinks an occasional diet coke about 3-4 times a week. He denies drinking sweet tea. He drinks about 2 alcoholic beverages (liquor, occasional beer) a day, 14 drinks/week.   Hyperlipidemia Lab Results  Component Value Date   CHOL 163 02/25/2016   HDL 51 02/25/2016   LDLCALC 78 02/25/2016   LDLDIRECT 91.3 06/19/2012   TRIG 168 (H) 02/25/2016   CHOLHDL 3.2 02/25/2016   Lab Results  Component Value Date   ALT 96 (H) 09/13/2016   AST 45 (H) 09/13/2016   ALKPHOS 75 09/13/2016   BILITOT 0.5 09/13/2016    Elevated LFT's Lab Results  Component Value Date   ALT 96 (H) 09/13/2016   AST 45 (H) 09/13/2016   ALKPHOS 75 09/13/2016   BILITOT 0.5 09/13/2016   He had negative Hep B antigen and antibody. He was stable compared to approximately 1 year ago. He had ultrasound done on Sept 2017, suspected fatty infiltrative change, no gall stones.   Erectile Dysfunction He has used Viagra 50-100mg  in the past as needed. He reports he's been taking half pill (50mg ). Over the past year, he's used about 10 times. He's had some headache rush with taking it. He denies blue hue vision, chest pain or chest tightness.   Patient Active Problem List   Diagnosis Date Noted  . Rectal pain 06/05/2015  . Hypertension   . Obesity   . Hyperlipidemia 11/21/2008  . PALPITATIONS 10/30/2008   Past Medical History:  Diagnosis Date  . GERD (gastroesophageal reflux disease)   . Hypertension   . Obesity    Steroid injection in rt knee   Past Surgical History:  Procedure Laterality Date  . ANAL FISTULECTOMY  1987  . APPENDECTOMY    . TONSILLECTOMY     Allergies  Allergen Reactions  . Gabapentin Other (See Comments)    Made very jittery   Prior to Admission medications  Medication Sig Start Date End Date Taking? Authorizing Provider  albuterol (PROVENTIL HFA;VENTOLIN HFA) 108 (90 Base) MCG/ACT inhaler Inhale 1-2 puffs into the lungs every 4 (four) hours as needed for wheezing or shortness of breath. 10/26/16   [name], MD  amoxicillin-clavulanate (AUGMENTIN) 875-125 MG tablet Take 1 tablet by mouth 2 (two) times daily. 10/26/16   [name], MD  clobetasol ointment (TEMOVATE) 0.05 % As directed 05/15/12   [provider]  Ibuprofen (ADVIL PO) Take by mouth daily.    [provider]  lisinopril (PRINIVIL,ZESTRIL) 10 MG tablet TAKE 1 TABLET DAILY 04/18/17   [name], MD  metoprolol succinate (TOPROL-XL) 25 MG 24 hr tablet TAKE 1 TABLET DAILY  04/18/17   [name], MD  nystatin-triamcinolone ointment (MYCOLOG) APPLY SPARINGLY TO THE AFFECTED AREA FOR UP TO 7 DAYS 11/18/15   Elby Beck, FNP  sildenafil (VIAGRA) 100 MG tablet Take 0.5-1 tablets (50-100 mg total) by mouth daily as needed for erectile dysfunction. 02/19/16   [name], MD   Social History   Social History  . Marital status: Married    Spouse name: N/A  . Number of children: N/A  . Years of education: N/A   Occupational History  . Not on file.   Social History Main Topics  . Smoking status: Never Smoker  . Smokeless tobacco: Never Used  . Alcohol use 4.2 oz/week    7 Standard drinks or equivalent per week  . Drug use: No  . Sexual activity: No   Other Topics Concern  . Not on file   Social History Narrative   Sales rep   Review of Systems  Constitutional: Negative for fatigue and unexpected weight change.  Eyes: Negative for visual disturbance.  Respiratory: Negative for cough, chest tightness and shortness of breath.   Cardiovascular: Negative for chest pain, palpitations and leg swelling.  Gastrointestinal: Negative for abdominal pain and Velazquez in stool.  Neurological: Negative for dizziness, light-headedness and headaches.       Objective:   Physical Exam  Constitutional: He is oriented to person, place, and time. He appears well-developed and well-nourished.  HENT:  Head: Normocephalic and atraumatic.  Eyes: Pupils are equal, round, and reactive to light. EOM are normal.  Neck: No JVD present. Carotid bruit is not present.  Cardiovascular: Normal rate, regular rhythm and normal heart sounds.   No murmur heard. Pulmonary/Chest: Effort normal and breath sounds normal. He has no rales.  Musculoskeletal: He exhibits no edema.  Neurological: He is alert and oriented to person, place, and time.  Skin: Skin is warm and dry.  Psychiatric: He has a normal mood and affect.  Vitals reviewed.   Vitals:   04/21/17 1635  BP:  138/80  Pulse: 66  Resp: 16  Temp: 98.5 F (36.9 C)  SpO2: 96%  Weight: 291 lb 9.6 oz (132.3 kg)  Height: 6\' 3"  (1.905 m)   Body mass index is 36.45 kg/m.     Assessment & Plan:   Nathaniel Velazquez is a 56 y.o. male Essential hypertension - Plan: Comprehensive metabolic panel, Lipid panel, lisinopril (PRINIVIL,ZESTRIL) 10 MG tablet, metoprolol succinate (TOPROL-XL) 25 MG 24 hr tablet  - stable, continue sale dose lisinopril, toprol, plans on returning for fasting labs.   Erectile dysfunction, unspecified erectile dysfunction type - Plan: sildenafil (VIAGRA) 25 MG tablet  - trial of lower dose viagra, - use lowest effective dose. Side effects discussed (including but not limited to headache/flushing,  blue discoloration of vision, possible vascular steal and risk of cardiac effects if underlying unknown coronary artery disease, and permanent sensorineural hearing loss). Understanding expressed.  Screening for hyperlipidemia Elevated liver enzymes - Plan: Comprehensive metabolic panel  - returning for fasting labs. Suspected fatty liver by prior testing. Weight loss with continued exercise and diet recommendations discussed.   Meds ordered this encounter  Medications  . sildenafil (VIAGRA) 25 MG tablet    Sig: Take 1-2 tablets (25-50 mg total) by mouth daily as needed for erectile dysfunction.    Dispense:  5 tablet    Refill:  5  . lisinopril (PRINIVIL,ZESTRIL) 10 MG tablet    Sig: Take 1 tablet (10 mg total) by mouth daily.    Dispense:  90 tablet    Refill:  1  . metoprolol succinate (TOPROL-XL) 25 MG 24 hr tablet    Sig: Take 1 tablet (25 mg total) by mouth daily.    Dispense:  90 tablet    Refill:  1   Patient Instructions   Return in the next few days for fasting labs only. Schedule physical in the next 6 months.  Keep up the good work with exercise most days per week, try the portion control and salad tricks we discussed as well as cutting back to 1 drink if possible  to help with weight and liver. I will repeat liver tests today.      IF you received an x-Velazquez today, you will receive an invoice from [facility]. Please contact [facility] at [phone] with questions or concerns regarding your invoice.   IF you received labwork today, you will receive an invoice from [facility]. Please contact [facility] at [phone] with questions or concerns regarding your invoice.   Our billing staff will not be able to assist you with questions regarding bills from these companies.  You will be contacted with the lab results as soon as they are available. The fastest way to get your results is to activate your My Chart account. Instructions are located on the last page of this paperwork. If you have not heard from [place] regarding the results in 2 weeks, please contact this office.       I personally performed the services described in this documentation, which was scribed in my presence. The recorded information has been reviewed and considered for accuracy and completeness, addended by me as needed, and agree with information above.  Signed,   Nathaniel Ray, MD [facility] [place].  04/23/17 11:37 PM

## 2017-04-25 ENCOUNTER — Ambulatory Visit (INDEPENDENT_AMBULATORY_CARE_PROVIDER_SITE_OTHER): Payer: BC Managed Care – PPO | Admitting: Family Medicine

## 2017-04-25 DIAGNOSIS — I10 Essential (primary) hypertension: Secondary | ICD-10-CM

## 2017-04-25 DIAGNOSIS — R748 Abnormal levels of other serum enzymes: Secondary | ICD-10-CM

## 2017-04-26 LAB — COMPREHENSIVE METABOLIC PANEL
ALBUMIN: 4.5 g/dL (ref 3.5–5.5)
ALK PHOS: 74 IU/L (ref 39–117)
ALT: 143 IU/L — ABNORMAL HIGH (ref 0–44)
AST: 81 IU/L — ABNORMAL HIGH (ref 0–40)
Albumin/Globulin Ratio: 1.7 (ref 1.2–2.2)
BILIRUBIN TOTAL: 0.5 mg/dL (ref 0.0–1.2)
BUN / CREAT RATIO: 12 (ref 9–20)
BUN: 13 mg/dL (ref 6–24)
CHLORIDE: 101 mmol/L (ref 96–106)
CO2: 23 mmol/L (ref 20–29)
CREATININE: 1.06 mg/dL (ref 0.76–1.27)
Calcium: 10 mg/dL (ref 8.7–10.2)
GFR calc non Af Amer: 78 mL/min/{1.73_m2} (ref >59)
GFR, EST AFRICAN AMERICAN: 90 mL/min/{1.73_m2} (ref >59)
GLOBULIN, TOTAL: 2.6 g/dL (ref 1.5–4.5)
Glucose: 104 mg/dL — ABNORMAL HIGH (ref 65–99)
Potassium: 5.1 mmol/L (ref 3.5–5.2)
SODIUM: 138 mmol/L (ref 134–144)
TOTAL PROTEIN: 7.1 g/dL (ref 6.0–8.5)

## 2017-04-26 LAB — LIPID PANEL
CHOL/HDL RATIO: 3.2 ratio (ref 0.0–5.0)
CHOLESTEROL TOTAL: 177 mg/dL (ref 100–199)
HDL: 56 mg/dL (ref >39)
LDL CALC: 91 mg/dL (ref 0–99)
TRIGLYCERIDES: 151 mg/dL — AB (ref 0–149)
VLDL CHOLESTEROL CAL: 30 mg/dL (ref 5–40)

## 2017-05-04 NOTE — Progress Notes (Signed)
Lab only visit 

## 2017-05-11 ENCOUNTER — Other Ambulatory Visit: Payer: Self-pay | Admitting: Family Medicine

## 2017-05-11 DIAGNOSIS — I10 Essential (primary) hypertension: Secondary | ICD-10-CM

## 2017-05-12 ENCOUNTER — Telehealth: Payer: Self-pay | Admitting: Family Medicine

## 2017-05-12 NOTE — Telephone Encounter (Signed)
Pt is needing his lisinopril and metoprolol refilled   Best number [phone]

## 2017-05-16 ENCOUNTER — Telehealth: Payer: Self-pay

## 2017-05-16 NOTE — Telephone Encounter (Signed)
[facility] never received rx for 90 day supply for metoprolol and lisinopril. Reorderd and pt aware.

## 2017-05-16 NOTE — Telephone Encounter (Signed)
LVM that rx has been refilled.

## 2017-08-04 DIAGNOSIS — S46019A Strain of muscle(s) and tendon(s) of the rotator cuff of unspecified shoulder, initial encounter: Secondary | ICD-10-CM | POA: Insufficient documentation

## 2017-08-04 DIAGNOSIS — S46011D Strain of muscle(s) and tendon(s) of the rotator cuff of right shoulder, subsequent encounter: Secondary | ICD-10-CM | POA: Insufficient documentation

## 2017-08-19 DIAGNOSIS — M25611 Stiffness of right shoulder, not elsewhere classified: Secondary | ICD-10-CM | POA: Insufficient documentation

## 2017-10-14 ENCOUNTER — Other Ambulatory Visit: Payer: Self-pay | Admitting: Family Medicine

## 2017-10-14 DIAGNOSIS — I10 Essential (primary) hypertension: Secondary | ICD-10-CM

## 2017-10-25 ENCOUNTER — Ambulatory Visit: Payer: BC Managed Care – PPO | Admitting: Family Medicine

## 2017-10-31 ENCOUNTER — Encounter: Payer: Self-pay | Admitting: Gastroenterology

## 2017-11-03 ENCOUNTER — Ambulatory Visit: Payer: BC Managed Care – PPO | Admitting: Family Medicine

## 2017-11-24 ENCOUNTER — Other Ambulatory Visit: Payer: Self-pay

## 2017-11-24 ENCOUNTER — Encounter: Payer: Self-pay | Admitting: Family Medicine

## 2017-11-24 ENCOUNTER — Ambulatory Visit: Payer: BC Managed Care – PPO | Admitting: Family Medicine

## 2017-11-24 VITALS — BP 130/60 | HR 65 | Temp 98.2°F | Ht 74.0 in | Wt 294.0 lb

## 2017-11-24 DIAGNOSIS — R945 Abnormal results of liver function studies: Secondary | ICD-10-CM | POA: Diagnosis not present

## 2017-11-24 DIAGNOSIS — I10 Essential (primary) hypertension: Secondary | ICD-10-CM

## 2017-11-24 DIAGNOSIS — N529 Male erectile dysfunction, unspecified: Secondary | ICD-10-CM

## 2017-11-24 DIAGNOSIS — E669 Obesity, unspecified: Secondary | ICD-10-CM | POA: Diagnosis not present

## 2017-11-24 DIAGNOSIS — Z13220 Encounter for screening for lipoid disorders: Secondary | ICD-10-CM

## 2017-11-24 DIAGNOSIS — R739 Hyperglycemia, unspecified: Secondary | ICD-10-CM | POA: Diagnosis not present

## 2017-11-24 DIAGNOSIS — Z6837 Body mass index (BMI) 37.0-37.9, adult: Secondary | ICD-10-CM

## 2017-11-24 DIAGNOSIS — R7989 Other specified abnormal findings of blood chemistry: Secondary | ICD-10-CM

## 2017-11-24 MED ORDER — SILDENAFIL CITRATE 50 MG PO TABS: 25.0000 mg | ORAL_TABLET | Freq: Every day | ORAL | 3 refills | Status: DC | PRN

## 2017-11-24 MED ORDER — METOPROLOL SUCCINATE ER 25 MG PO TB24: 25.0000 mg | ORAL_TABLET | Freq: Every day | ORAL | 1 refills | Status: DC

## 2017-11-24 MED ORDER — LISINOPRIL 10 MG PO TABS: 10.0000 mg | ORAL_TABLET | Freq: Every day | ORAL | 1 refills | Status: DC

## 2017-11-24 NOTE — Progress Notes (Signed)
I,Arielle J Pollard,acting as a scribe for [name], MD.,have documented all relevant documentation on the behalf of [name], MD,as directed by  [name], MD while in the presence of [name], MD. 11/24/2017  2:00pm  Subjective:    Patient ID: Nathaniel Velazquez, male    DOB: [birth year], 57 y.o.   MRN: [id number] Chief Complaint  Patient presents with  . Chonic Conditions    6 month f/u and med refills   HPI   Subjective:  Nathaniel Velazquez is a 57 y.o. male here for follow up.   Hypertension: BP Readings from Last 3 Encounters:  11/24/17 130/60  04/21/17 138/80  10/26/16 133/75   Lab Results  Component Value Date   CREATININE 1.06 04/25/2017   -He takes Lisinopril and metoprolol -He tolerates this well with no side affects.   Erectile dysfucntion -Low dose switched from 50 mg to 25 mg of Viagra was provided prior -Headaches with higher doses.- head rushing feeling.  -He notes that he hasn't tried the 25 mg dose yet due to increased insurance costs.  -He denies changes in vision or hearing. No chest pains.    Obesity  Wt Readings from Last 3 Encounters:  11/24/17 294 lb (133.4 kg)  04/21/17 291 lb 9.6 oz (132.3 kg)  10/26/16 295 lb (133.8 kg)   Body mass index is 37.75 kg/m.  -He exercises 3 days per week. -He notes that his weight "could be better." -He says he would like to continue trying to exercise and manage his diet on his own.   Elevated Liver Tests -previous evaluations suspected fatty liver -Ultrasound in Sept- 2017  showed slight elevation one checked in Oct 2018 compared to  ALT 45 AST 96  inFeb 2018 -He denies abdominal pain and he is eating and drinking fine.    Patient Active Problem List   Diagnosis Date Noted  . Rectal pain 06/05/2015  . Hypertension   . Obesity   . Hyperlipidemia 11/21/2008  . PALPITATIONS 10/30/2008   Past Medical History:  Diagnosis Date  . GERD (gastroesophageal reflux disease)   .  Hypertension   . Obesity    Steroid injection in rt knee   Past Surgical History:  Procedure Laterality Date  . ANAL FISTULECTOMY  1987  . APPENDECTOMY    . TONSILLECTOMY     Allergies  Allergen Reactions  . Gabapentin Other (See Comments)    Made very jittery   Prior to Admission medications   Medication Sig Start Date End Date Taking? Authorizing Provider  clobetasol ointment (TEMOVATE) 0.05 % As directed 05/15/12  Yes [provider]  Ibuprofen (ADVIL PO) Take by mouth daily.   Yes [provider]  lisinopril (PRINIVIL,ZESTRIL) 10 MG tablet TAKE 1 TABLET DAILY 10/14/17  Yes [name], MD  metoprolol succinate (TOPROL-XL) 25 MG 24 hr tablet TAKE 1 TABLET DAILY 10/14/17  Yes [name], MD  nystatin-triamcinolone ointment (MYCOLOG) APPLY SPARINGLY TO THE AFFECTED AREA FOR UP TO 7 DAYS 11/18/15  Yes Elby Beck, FNP  sildenafil (VIAGRA) 25 MG tablet Take 1-2 tablets (25-50 mg total) by mouth daily as needed for erectile dysfunction. 04/21/17  Yes [name], MD   Social History   Socioeconomic History  . Marital status: Married    Spouse name: Not on file  . Number of children: Not on file  . Years of education: Not on file  . Highest education level: Not on file  Occupational History  . Not on file  Social Needs  . Financial resource strain: Not on file  . Food insecurity:    Worry: Not on file    Inability: Not on file  . Transportation needs:    Medical: Not on file    Non-medical: Not on file  Tobacco Use  . Smoking status: Never Smoker  . Smokeless tobacco: Never Used  Substance and Sexual Activity  . Alcohol use: Yes    Alcohol/week: 4.2 oz    Types: 7 Standard drinks or equivalent per week  . Drug use: No  . Sexual activity: Never  Lifestyle  . Physical activity:    Days per week: Not on file    Minutes per session: Not on file  . Stress: Not on file  Relationships  . Social connections:    Talks on phone: Not  on file    Gets together: Not on file    Attends religious service: Not on file    Active member of club or organization: Not on file    Attends meetings of clubs or organizations: Not on file    Relationship status: Not on file  . Intimate partner violence:    Fear of current or ex partner: Not on file    Emotionally abused: Not on file    Physically abused: Not on file    Forced sexual activity: Not on file  Other Topics Concern  . Not on file  Social History Narrative   Sales rep     Review of Systems  Gastrointestinal: Negative.   All other systems reviewed and are negative.       Objective:   Physical Exam  Constitutional: He is oriented to person, place, and time. He appears well-developed and well-nourished.  HENT:  Head: Normocephalic and atraumatic.  Eyes: Pupils are equal, round, and reactive to light. EOM are normal.  Neck: No JVD present. Carotid bruit is not present.  Cardiovascular: Normal rate, regular rhythm and normal heart sounds.  No murmur heard. Pulmonary/Chest: Effort normal and breath sounds normal. He has no rales.  Musculoskeletal: He exhibits no edema.  Neurological: He is alert and oriented to person, place, and time.  Skin: Skin is warm and dry.  Psychiatric: He has a normal mood and affect.  Vitals reviewed.     Vitals:   11/24/17 1403  BP: 130/60  Pulse: 65  Temp: 98.2 F (36.8 C)  TempSrc: Oral  SpO2: 95%  Weight: 294 lb (133.4 kg)  Height: 6\' 2"  (1.88 m)       Assessment & Plan:   Nathaniel Velazquez is a 57 y.o. male Elevated liver function tests - Plan: Comprehensive metabolic panel  - repeat LFT's, if persistently elevated consider GI eval.   Essential hypertension - Plan: metoprolol succinate (TOPROL-XL) 25 MG 24 hr tablet, lisinopril (PRINIVIL,ZESTRIL) 10 MG tablet  - stable, tolerating current regimen - no changes, meds pending as above.   Class 2 obesity without serious comorbidity with body mass index (BMI) of 37.0  to 37.9 in adult, unspecified obesity type - Plan: Hemoglobin A1c  - exercise/diet approach. Recheck in next 6 months for physical  Erectile dysfunction, unspecified erectile dysfunction type - Plan: sildenafil (VIAGRA) 50 MG tablet  - viagra Rx given - use lowest effective dose. Side effects discussed (including but not limited to headache/flushing, blue discoloration of vision, possible vascular steal and risk of cardiac effects if underlying unknown coronary artery disease, and permanent sensorineural hearing loss).  Understanding expressed.  Screening for hyperlipidemia - Plan: Lipid panel  Hyperglycemia - Plan: Hemoglobin A1c  - screen for diabetes.   Meds ordered this encounter  Medications  . metoprolol succinate (TOPROL-XL) 25 MG 24 hr tablet    Sig: Take 1 tablet (25 mg total) by mouth daily.    Dispense:  90 tablet    Refill:  1  . lisinopril (PRINIVIL,ZESTRIL) 10 MG tablet    Sig: Take 1 tablet (10 mg total) by mouth daily.    Dispense:  90 tablet    Refill:  1  . sildenafil (VIAGRA) 50 MG tablet    Sig: Take 0.5-1 tablets (25-50 mg total) by mouth daily as needed for erectile dysfunction.    Dispense:  10 tablet    Refill:  3   Patient Instructions    I will check your liver tests on upcoming fasting blood work.  If those are continuing to elevate, I would recommend meeting with the gastroenterologist.  I will also screen for diabetes with a hemoglobin A1c as well as look at your kidney function.   No change in medications for now.   I did send over the Viagra 50 mg tablet to your pharmacy.  You can try 1/2-1 of those tablets as needed.  If that is still cost prohibitive, let me know and we can look at other options.  Follow-up with me in 6 months for physical, let me know if there are questions in the meantime.   IF you received an x-ray today, you will receive an invoice from [facility]. Please contact [facility] at [phone] with questions or  concerns regarding your invoice.   IF you received labwork today, you will receive an invoice from [facility]. Please contact [facility] at [phone] with questions or concerns regarding your invoice.   Our billing staff will not be able to assist you with questions regarding bills from these companies.  You will be contacted with the lab results as soon as they are available. The fastest way to get your results is to activate your My Chart account. Instructions are located on the last page of this paperwork. If you have not heard from [place] regarding the results in 2 weeks, please contact this office.       I personally performed the services described in this documentation, which was scribed in my presence. The recorded information has been reviewed and considered for accuracy and completeness, addended by me as needed, and agree with information above.  Signed,   [name], MD [facility] [place].  11/27/17 10:19 PM

## 2017-11-24 NOTE — Patient Instructions (Addendum)
  I will check your liver tests on upcoming fasting blood work.  If those are continuing to elevate, I would recommend meeting with the gastroenterologist.  I will also screen for diabetes with a hemoglobin A1c as well as look at your kidney function.   No change in medications for now.   I did send over the Viagra 50 mg tablet to your pharmacy.  You can try 1/2-1 of those tablets as needed.  If that is still cost prohibitive, let me know and we can look at other options.  Follow-up with me in 6 months for physical, let me know if there are questions in the meantime.   IF you received an x-ray today, you will receive an invoice from [facility]. Please contact [facility] at [phone] with questions or concerns regarding your invoice.   IF you received labwork today, you will receive an invoice from [facility]. Please contact [facility] at [phone] with questions or concerns regarding your invoice.   Our billing staff will not be able to assist you with questions regarding bills from these companies.  You will be contacted with the lab results as soon as they are available. The fastest way to get your results is to activate your My Chart account. Instructions are located on the last page of this paperwork. If you have not heard from [place] regarding the results in 2 weeks, please contact this office.

## 2017-11-25 ENCOUNTER — Telehealth: Payer: Self-pay

## 2017-11-25 NOTE — Telephone Encounter (Signed)
PA for sildenafil started: Key: [id number]

## 2017-11-27 ENCOUNTER — Encounter: Payer: Self-pay | Admitting: Family Medicine

## 2017-11-30 ENCOUNTER — Ambulatory Visit: Payer: BC Managed Care – PPO

## 2017-11-30 DIAGNOSIS — R739 Hyperglycemia, unspecified: Secondary | ICD-10-CM

## 2017-11-30 DIAGNOSIS — R945 Abnormal results of liver function studies: Secondary | ICD-10-CM

## 2017-11-30 DIAGNOSIS — E669 Obesity, unspecified: Secondary | ICD-10-CM

## 2017-11-30 DIAGNOSIS — Z6837 Body mass index (BMI) 37.0-37.9, adult: Principal | ICD-10-CM

## 2017-11-30 DIAGNOSIS — Z13220 Encounter for screening for lipoid disorders: Secondary | ICD-10-CM

## 2017-11-30 DIAGNOSIS — R7989 Other specified abnormal findings of blood chemistry: Secondary | ICD-10-CM

## 2017-11-30 NOTE — Telephone Encounter (Signed)
PA cancelled by [facility].   Incoming fax received from pharmacy on 11/28/17 requesting PA for Sildenafil 50mg  tablet.   Phone call to [facility] on [address]. Rx Bin, Group, and PCN obtained from pharmacy.  Prior authorization submitted through CoverMyMeds: Key: [id number] - PA Case ID: [id number]

## 2017-12-01 LAB — COMPREHENSIVE METABOLIC PANEL
ALK PHOS: 76 IU/L (ref 39–117)
ALT: 126 IU/L — AB (ref 0–44)
AST: 61 IU/L — AB (ref 0–40)
Albumin/Globulin Ratio: 1.5 (ref 1.2–2.2)
Albumin: 4.3 g/dL (ref 3.5–5.5)
BUN/Creatinine Ratio: 18 (ref 9–20)
BUN: 20 mg/dL (ref 6–24)
Bilirubin Total: 0.6 mg/dL (ref 0.0–1.2)
CALCIUM: 9.9 mg/dL (ref 8.7–10.2)
CO2: 22 mmol/L (ref 20–29)
CREATININE: 1.09 mg/dL (ref 0.76–1.27)
Chloride: 98 mmol/L (ref 96–106)
GFR calc Af Amer: 87 mL/min/{1.73_m2} (ref >59)
GFR calc non Af Amer: 75 mL/min/{1.73_m2} (ref >59)
GLOBULIN, TOTAL: 2.8 g/dL (ref 1.5–4.5)
GLUCOSE: 112 mg/dL — AB (ref 65–99)
Potassium: 4.7 mmol/L (ref 3.5–5.2)
Sodium: 136 mmol/L (ref 134–144)
Total Protein: 7.1 g/dL (ref 6.0–8.5)

## 2017-12-01 LAB — HEMOGLOBIN A1C
ESTIMATED AVERAGE GLUCOSE: 114 mg/dL
HEMOGLOBIN A1C: 5.6 % (ref 4.8–5.6)

## 2017-12-01 LAB — LIPID PANEL
Chol/HDL Ratio: 3.6 ratio (ref 0.0–5.0)
Cholesterol, Total: 173 mg/dL (ref 100–199)
HDL: 48 mg/dL (ref >39)
LDL CALC: 99 mg/dL (ref 0–99)
Triglycerides: 128 mg/dL (ref 0–149)
VLDL CHOLESTEROL CAL: 26 mg/dL (ref 5–40)

## 2017-12-07 ENCOUNTER — Telehealth: Payer: Self-pay

## 2017-12-07 ENCOUNTER — Telehealth: Payer: Self-pay | Admitting: Family Medicine

## 2017-12-07 NOTE — Telephone Encounter (Signed)
PA for sildenafil denied. LVM advising pt that cheapest option for fill will be [facility].

## 2017-12-07 NOTE — Telephone Encounter (Signed)
Opened in error

## 2017-12-08 ENCOUNTER — Telehealth: Payer: Self-pay

## 2017-12-08 NOTE — Telephone Encounter (Signed)
Copied from [place] [id number]. Topic: General - Other >> Dec 07, 2017  5:51 PM Mcneil, Ja-Kwan wrote: Reason for CRM: Pt states the insurance will not cover the Rx for sildenafil (VIAGRA) 50 MG tablet. Pt states the cost is prohibited. Pt requesting call back to discuss alternative to sildenafil (VIAGRA) 50 MG tablet. Cb# [phone] or send message through [facility]

## 2017-12-12 NOTE — Telephone Encounter (Signed)
mychart message sent to patient

## 2017-12-13 ENCOUNTER — Encounter: Payer: Self-pay | Admitting: Gastroenterology

## 2017-12-13 MED ORDER — SILDENAFIL CITRATE 20 MG PO TABS: 20.0000 mg | ORAL_TABLET | Freq: Once | ORAL | 1 refills | Status: AC

## 2017-12-13 NOTE — Addendum Note (Signed)
Addended by: [name] R on: 12/13/2017 05:31 PM   Modules accepted: Orders

## 2017-12-13 NOTE — Telephone Encounter (Signed)
Patient called and advised of note by Dr. Carlota Raspberry on 12/13/17, patient verbalized understanding.

## 2017-12-13 NOTE — Telephone Encounter (Signed)
Prescription printed - can pick up at his convenience.

## 2018-02-07 ENCOUNTER — Ambulatory Visit (AMBULATORY_SURGERY_CENTER): Payer: Self-pay | Admitting: *Deleted

## 2018-02-07 VITALS — Ht 74.0 in | Wt 295.0 lb

## 2018-02-07 DIAGNOSIS — Z86010 Personal history of colonic polyps: Secondary | ICD-10-CM

## 2018-02-07 MED ORDER — PEG 3350-KCL-NA BICARB-NACL 420 G PO SOLR: 4000.0000 mL | Freq: Once | ORAL | 0 refills | Status: AC

## 2018-02-07 NOTE — Progress Notes (Signed)
No egg or soy allergy known to patient  No issues with past sedation with any surgeries  or procedures, no intubation problems  No diet pills per patient No home 02 use per patient  No blood thinners per patient  Pt denies issues with constipation  No A fib or A flutter  EMMI video offered, patient declined

## 2018-02-09 ENCOUNTER — Encounter: Payer: Self-pay | Admitting: Gastroenterology

## 2018-02-21 ENCOUNTER — Ambulatory Visit (AMBULATORY_SURGERY_CENTER): Payer: BC Managed Care – PPO | Admitting: Gastroenterology

## 2018-02-21 ENCOUNTER — Encounter: Payer: Self-pay | Admitting: Gastroenterology

## 2018-02-21 VITALS — BP 113/59 | HR 70 | Temp 98.2°F | Resp 11 | Ht 74.0 in | Wt 294.0 lb

## 2018-02-21 DIAGNOSIS — D124 Benign neoplasm of descending colon: Secondary | ICD-10-CM | POA: Diagnosis not present

## 2018-02-21 DIAGNOSIS — Z86010 Personal history of colonic polyps: Secondary | ICD-10-CM

## 2018-02-21 DIAGNOSIS — D120 Benign neoplasm of cecum: Secondary | ICD-10-CM

## 2018-02-21 DIAGNOSIS — D123 Benign neoplasm of transverse colon: Secondary | ICD-10-CM | POA: Diagnosis not present

## 2018-02-21 MED ORDER — SODIUM CHLORIDE 0.9 % IV SOLN: 500.0000 mL | Freq: Once | INTRAVENOUS | Status: DC

## 2018-02-21 NOTE — Patient Instructions (Signed)
Discharge instructions given. Handout on polyps. Resume previous medications. YOU HAD AN ENDOSCOPIC PROCEDURE TODAY AT THE [facility]:   Refer to the procedure report that was given to you for any specific questions about what was found during the examination.  If the procedure report does not answer your questions, please call your gastroenterologist to clarify.  If you requested that your care partner not be given the details of your procedure findings, then the procedure report has been included in a sealed envelope for you to review at your convenience later.  YOU SHOULD EXPECT: Some feelings of bloating in the abdomen. Passage of more gas than usual.  Walking can help get rid of the air that was put into your GI tract during the procedure and reduce the bloating. If you had a lower endoscopy (such as a colonoscopy or flexible sigmoidoscopy) you may notice spotting of blood in your stool or on the toilet paper. If you underwent a bowel prep for your procedure, you may not have a normal bowel movement for a few days.  Please Note:  You might notice some irritation and congestion in your nose or some drainage.  This is from the oxygen used during your procedure.  There is no need for concern and it should clear up in a day or so.  SYMPTOMS TO REPORT IMMEDIATELY:   Following lower endoscopy (colonoscopy or flexible sigmoidoscopy):  Excessive amounts of blood in the stool  Significant tenderness or worsening of abdominal pains  Swelling of the abdomen that is new, acute  Fever of 100F or higher   For urgent or emergent issues, a gastroenterologist can be reached at any hour by calling [phone].   DIET:  We do recommend a small meal at first, but then you may proceed to your regular diet.  Drink plenty of fluids but you should avoid alcoholic beverages for 24 hours.  ACTIVITY:  You should plan to take it easy for the rest of today and you should NOT DRIVE or use heavy  machinery until tomorrow (because of the sedation medicines used during the test).    FOLLOW UP: Our staff will call the number listed on your records the next business day following your procedure to check on you and address any questions or concerns that you may have regarding the information given to you following your procedure. If we do not reach you, we will leave a message.  However, if you are feeling well and you are not experiencing any problems, there is no need to return our call.  We will assume that you have returned to your regular daily activities without incident.  If any biopsies were taken you will be contacted by phone or by letter within the next 1-3 weeks.  Please call us at [phone] if you have not heard about the biopsies in 3 weeks.    SIGNATURES/CONFIDENTIALITY: You and/or your care partner have signed paperwork which will be entered into your electronic medical record.  These signatures attest to the fact that that the information above on your After Visit Summary has been reviewed and is understood.  Full responsibility of the confidentiality of this discharge information lies with you and/or your care-partner. 

## 2018-02-21 NOTE — Op Note (Signed)
[place] Patient Name: Nathaniel Velazquez Procedure Date: 02/21/2018 8:52 AM MRN: [id number] Endoscopist: Milus Banister , MD Age: 57 Referring MD:  Date of Birth: Nov 19, 1960 Gender: Male Account #: [id number] Procedure:                Colonoscopy Indications:              High risk colon cancer surveillance: Personal                            history of colonic polyps; Colonoscopy 11/2014 three                            subCM adeomas removed Medicines:                Monitored Anesthesia Care Procedure:                Pre-Anesthesia Assessment:                           - Prior to the procedure, a History and Physical                            was performed, and patient medications and                            allergies were reviewed. The patient's tolerance of                            previous anesthesia was also reviewed. The risks                            and benefits of the procedure and the sedation                            options and risks were discussed with the patient.                            All questions were answered, and informed consent                            was obtained. Prior Anticoagulants: The patient has                            taken no previous anticoagulant or antiplatelet                            agents. ASA Grade Assessment: II - A patient with                            mild systemic disease. After reviewing the risks                            and benefits, the patient was deemed in  satisfactory condition to undergo the procedure.                           After obtaining informed consent, the colonoscope                            was passed under direct vision. Throughout the                            procedure, the patient's blood pressure, pulse, and                            oxygen saturations were monitored continuously. The                            Colonoscope was introduced through the  anus and                            advanced to the the cecum, identified by                            appendiceal orifice and ileocecal valve. The                            colonoscopy was performed without difficulty. The                            patient tolerated the procedure well. The quality                            of the bowel preparation was good. The ileocecal                            valve, appendiceal orifice, and rectum were                            photographed. Scope In: 8:51:26 AM Scope Out: 9:05:30 AM Scope Withdrawal Time: 0 hours 11 minutes 38 seconds  Total Procedure Duration: 0 hours 14 minutes 4 seconds  Findings:                 Four sessile polyps were found in the descending                            colon and transverse colon. The polyps were 2 to 3                            mm in size. These polyps were removed with a cold                            snare. Resection and retrieval were complete.                           A 1 mm polyp was found in the cecum. The  polyp was                            sessile. The polyp was removed with a cold biopsy                            forceps. Resection and retrieval were complete.                           The exam was otherwise without abnormality on                            direct and retroflexion views. Complications:            No immediate complications. Estimated blood loss:                            None. Estimated Blood Loss:     Estimated blood loss: none. Impression:               - Four 2 to 3 mm polyps in the descending colon and                            in the transverse colon, removed with a cold snare.                            Resected and retrieved.                           - One 1 mm polyp in the cecum, removed with a cold                            biopsy forceps. Resected and retrieved.                           - The examination was otherwise normal on direct                             and retroflexion views. Recommendation:           - Patient has a contact number available for                            emergencies. The signs and symptoms of potential                            delayed complications were discussed with the                            patient. Return to normal activities tomorrow.                            Written discharge instructions were provided to the  patient.                           - Resume previous diet.                           - Continue present medications.                           You will receive a letter within 2-3 weeks with the                            pathology results and my final recommendations.                           If the polyp(s) is proven to be 'pre-cancerous' on                            pathology, you will need repeat colonoscopy in 3-5                            years. Milus Banister, MD 02/21/2018 9:08:23 AM This report has been signed electronically.

## 2018-02-21 NOTE — Progress Notes (Signed)
Called to room to assist during endoscopic procedure.  Patient ID and intended procedure confirmed with present staff. Received instructions for my participation in the procedure from the performing physician.  

## 2018-02-21 NOTE — Progress Notes (Signed)
Pt's states no medical or surgical changes since previsit or office visit. 

## 2018-02-21 NOTE — Progress Notes (Signed)
Report given to PACU, vss 

## 2018-02-22 ENCOUNTER — Telehealth: Payer: Self-pay

## 2018-02-22 ENCOUNTER — Telehealth: Payer: Self-pay | Admitting: *Deleted

## 2018-02-22 NOTE — Telephone Encounter (Signed)
NO ANSWER, MESSAGE LEFT FOR PATIENT. 

## 2018-02-22 NOTE — Telephone Encounter (Signed)
  Follow up Call-  Call back number 02/21/2018  Post procedure Call Back phone  # [phone]  Permission to leave phone message Yes  Some recent data might be hidden     Patient questions:  Do you have a fever, pain , or abdominal swelling? No. Pain Score  0 *  Have you tolerated food without any problems? Yes.    Have you been able to return to your normal activities? Yes.    Do you have any questions about your discharge instructions: Diet   No. Medications  No. Follow up visit  No.  Do you have questions or concerns about your Care? No.  Actions: * If pain score is 4 or above: No action needed, pain <4.

## 2018-02-24 ENCOUNTER — Encounter: Payer: Self-pay | Admitting: Gastroenterology

## 2018-05-02 ENCOUNTER — Ambulatory Visit: Payer: BC Managed Care – PPO | Admitting: Family Medicine

## 2018-05-02 ENCOUNTER — Encounter: Payer: Self-pay | Admitting: Family Medicine

## 2018-05-02 ENCOUNTER — Ambulatory Visit (INDEPENDENT_AMBULATORY_CARE_PROVIDER_SITE_OTHER): Payer: BC Managed Care – PPO

## 2018-05-02 ENCOUNTER — Ambulatory Visit: Payer: Self-pay | Admitting: *Deleted

## 2018-05-02 ENCOUNTER — Other Ambulatory Visit: Payer: Self-pay

## 2018-05-02 VITALS — BP 140/78 | HR 70 | Temp 97.1°F | Ht 74.0 in | Wt 295.0 lb

## 2018-05-02 DIAGNOSIS — R002 Palpitations: Secondary | ICD-10-CM | POA: Diagnosis not present

## 2018-05-02 DIAGNOSIS — I10 Essential (primary) hypertension: Secondary | ICD-10-CM

## 2018-05-02 DIAGNOSIS — R05 Cough: Secondary | ICD-10-CM | POA: Diagnosis not present

## 2018-05-02 DIAGNOSIS — M940 Chondrocostal junction syndrome [Tietze]: Secondary | ICD-10-CM | POA: Diagnosis not present

## 2018-05-02 DIAGNOSIS — R059 Cough, unspecified: Secondary | ICD-10-CM

## 2018-05-02 LAB — POCT CBC
Granulocyte percent: 58.5 %G (ref 37–80)
HCT, POC: 47.3 % (ref 43.5–53.7)
Hemoglobin: 16 g/dL (ref 14.1–18.1)
Lymph, poc: 2.1 (ref 0.6–3.4)
MCH, POC: 31.9 pg — AB (ref 27–31.2)
MCHC: 33.7 g/dL (ref 31.8–35.4)
MCV: 94.4 fL (ref 80–97)
MID (cbc): 0.9 (ref 0–0.9)
MPV: 7.1 fL (ref 0–99.8)
POC Granulocyte: 4.2 (ref 2–6.9)
POC LYMPH PERCENT: 29.3 %L (ref 10–50)
POC MID %: 12.2 %M — AB (ref 0–12)
Platelet Count, POC: 254 10*3/uL (ref 142–424)
RBC: 5.01 M/uL (ref 4.69–6.13)
RDW, POC: 12.9 %
WBC: 7.2 10*3/uL (ref 4.6–10.2)

## 2018-05-02 MED ORDER — BENZONATATE 100 MG PO CAPS: 100.0000 mg | ORAL_CAPSULE | Freq: Three times a day (TID) | ORAL | 0 refills | Status: DC | PRN

## 2018-05-02 MED ORDER — DICLOFENAC SODIUM 75 MG PO TBEC: 75.0000 mg | DELAYED_RELEASE_TABLET | Freq: Two times a day (BID) | ORAL | 0 refills | Status: DC

## 2018-05-02 NOTE — Telephone Encounter (Signed)
Patient is calling with concerns of discomfort he is feeling in his chest- patient has been suffering with non productive cough and thought that he had pulled a muscle. He is concerned because the pain is on the left and is intermittent-lasting only seconds at a time. Patient is getting ready for furniture market and wants to be checked before he is tied up and can not come in. Patient states today is the only day he can come- he is short tempered and declines ED- he is insistent on office visit.  Unable to finish triage protocol or review recommendations with him.

## 2018-05-02 NOTE — Patient Instructions (Addendum)
Stop ibuprofen while take diclofenac Take diclofenac with food Take pepcid twice a day while taking diclofenac    If you have lab work done today you will be contacted with your lab results within the next 2 weeks.  If you have not heard from [place] then please contact us. The fastest way to get your results is to register for My Chart.   IF you received an x-ray today, you will receive an invoice from [facility]. Please contact [facility] at [phone] with questions or concerns regarding your invoice.   IF you received labwork today, you will receive an invoice from [place]. Please contact [facility] at [phone] with questions or concerns regarding your invoice.   Our billing staff will not be able to assist you with questions regarding bills from these companies.  You will be contacted with the lab results as soon as they are available. The fastest way to get your results is to activate your My Chart account. Instructions are located on the last page of this paperwork. If you have not heard from [place] regarding the results in 2 weeks, please contact this office.     Cough, Adult A cough helps to clear your throat and lungs. A cough may last only 2-3 weeks (acute), or it may last longer than 8 weeks (chronic). Many different things can cause a cough. A cough may be a sign of an illness or another medical condition. Follow these instructions at home:  Pay attention to any changes in your cough.  Take medicines only as told by your doctor. ? If you were prescribed an antibiotic medicine, take it as told by your doctor. Do not stop taking it even if you start to feel better. ? Talk with your doctor before you try using a cough medicine.  Drink enough fluid to keep your pee (urine) clear or pale yellow.  If the air is dry, use a cold steam vaporizer or humidifier in your home.  Stay away from things that make you cough at work or at home.  If your cough is worse at  night, try using extra pillows to raise your head up higher while you sleep.  Do not smoke, and try not to be around smoke. If you need help quitting, ask your doctor.  Do not have caffeine.  Do not drink alcohol.  Rest as needed. Contact a doctor if:  You have new problems (symptoms).  You cough up yellow fluid (pus).  Your cough does not get better after 2-3 weeks, or your cough gets worse.  Medicine does not help your cough and you are not sleeping well.  You have pain that gets worse or pain that is not helped with medicine.  You have a fever.  You are losing weight and you do not know why.  You have night sweats. Get help right away if:  You cough up blood.  You have trouble breathing.  Your heartbeat is very fast. This information is not intended to replace advice given to you by your health care provider. Make sure you discuss any questions you have with your health care provider. Document Released: 03/18/2011 Document Revised: 12/11/2015 Document Reviewed: 09/11/2014 Elsevier Interactive Patient Education  [employer].

## 2018-05-02 NOTE — Progress Notes (Signed)
10/15/20191:44 PM  Nathaniel Velazquez [birth year], 57 y.o. male [id number]  Chief Complaint  Patient presents with  . Cough    having a strange rythym condition goin on with the chest. Not really a pain. Happens frequently    HPI:   Patient is a 57 y.o. male with past medical history significant for HTN who presents today for cough  Has been fitting a chest cold for about a month Having non productive cough, no SOB No fever or chills Was doing sudafed and mucinex but had to stop due to BP Tried albuterol but that did not help, just made him jittery Has used netty pot, nasal saline  Does not feel nasal congestion However past several days he has been having a sudden quick electrical zap sharp pain under left pec, intermittent but multiple episodes through out the day Not associated with deep breathing or eating Strong fhx early CAD Non smoker Drinker about 3-4 drinks a day Very stressed recently due to big work event, but sx precede that  Takes 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 No No No No No     Depression screen [facility] 2/9 05/02/2018 11/24/2017 04/21/2017  Decreased Interest 0 0 0  Down, Depressed, Hopeless 0 0 0  PHQ - 2 Score 0 0 0    Allergies  Allergen Reactions  . Gabapentin Other (See Comments)    Made very jittery    Prior to Admission medications   Medication Sig Start Date End Date Taking? Authorizing Provider  clobetasol ointment (TEMOVATE) 0.05 % As directed 05/15/12  Yes [provider]  Ibuprofen (ADVIL PO) Take by mouth daily.   Yes [provider]  lisinopril (PRINIVIL,ZESTRIL) 10 MG tablet Take 1 tablet (10 mg total) by mouth daily. 11/24/17  Yes [name], MD  metoprolol succinate (TOPROL-XL) 25 MG 24 hr tablet Take 1 tablet (25 mg total) by mouth daily. 11/24/17  Yes [name], MD  nystatin-triamcinolone ointment (MYCOLOG)  APPLY SPARINGLY TO THE AFFECTED AREA FOR UP TO 7 DAYS 11/18/15  Yes Elby Beck, FNP  psyllium (METAMUCIL) 58.6 % powder Take 1 packet by mouth 3 (three) times daily.   Yes [provider]  sildenafil (REVATIO) 20 MG tablet Take 20 mg by mouth 3 (three) times daily.   Yes [provider]    Past Medical History:  Diagnosis Date  . Arthritis   . GERD (gastroesophageal reflux disease)   . Hypertension   . Obesity    Steroid injection in rt knee    Past Surgical History:  Procedure Laterality Date  . ANAL FISTULECTOMY  1987  . APPENDECTOMY    . TONSILLECTOMY      Social History   Tobacco Use  . Smoking status: Never Smoker  . Smokeless tobacco: Never Used  Substance Use Topics  . Alcohol use: Yes    Alcohol/week: 21.0 standard drinks    Types: 21 Shots of liquor per week    Family History  Problem Relation Age of Onset  . Hypertrophic cardiomyopathy Brother   . Heart disease Mother   . Hypertension Mother   . Heart disease Father   . Hypertension Father   . Obesity Brother   . Heart disease Brother   . Colon cancer Neg Hx   . Colon polyps Neg Hx   . Esophageal cancer Neg Hx   . Pancreatic cancer Neg Hx   .  Stomach cancer Neg Hx   . Rectal cancer Neg Hx     ROS Per hpi  OBJECTIVE: Blood pressure 140/78, pulse 70, temperature (!) 97.1 F (36.2 C), temperature source Oral, height 6\' 2"  (1.88 m), weight 295 lb (133.8 kg), SpO2 94 %. Body mass index is 37.88 kg/m.   BP Readings from Last 3 Encounters:  05/02/18 140/78  02/21/18 (!) 113/59  11/24/17 130/60    Physical Exam  Constitutional: He is oriented to person, place, and time. He appears well-developed and well-nourished.  HENT:  Head: Normocephalic and atraumatic.  Right Ear: Hearing, tympanic membrane, external ear and ear canal normal.  Left Ear: Hearing, tympanic membrane, external ear and ear canal normal.  Mouth/Throat: Oropharynx is clear and moist. No oropharyngeal  exudate.  Eyes: Pupils are equal, round, and reactive to light. Conjunctivae and EOM are normal.  Neck: Neck supple.  Cardiovascular: Normal rate, regular rhythm and normal heart sounds. Exam reveals no gallop and no friction rub.  No murmur heard. Pulmonary/Chest: Effort normal. He has wheezes. He has no rales. He exhibits tenderness.  Musculoskeletal: He exhibits no edema.  Lymphadenopathy:    He has no cervical adenopathy.  Neurological: He is alert and oriented to person, place, and time.  Skin: Skin is warm and dry.  Psychiatric: He has a normal mood and affect.  Nursing note and vitals reviewed.   Results for orders placed or performed in visit on 05/02/18 (from the past 24 hour(s))  POCT CBC     Status: Abnormal   Collection Time: 05/02/18  2:18 PM  Result Value Ref Range   WBC 7.2 4.6 - 10.2 K/uL   Lymph, poc 2.1 0.6 - 3.4   POC LYMPH PERCENT 29.3 10 - 50 %L   MID (cbc) 0.9 0 - 0.9   POC MID % 12.2 (A) 0 - 12 %M   POC Granulocyte 4.2 2 - 6.9   Granulocyte percent 58.5 37 - 80 %G   RBC 5.01 4.69 - 6.13 M/uL   Hemoglobin 16.0 14.1 - 18.1 g/dL   HCT, POC 47.3 43.5 - 53.7 %   MCV 94.4 80 - 97 fL   MCH, POC 31.9 (A) 27 - 31.2 pg   MCHC 33.7 31.8 - 35.4 g/dL   RDW, POC 12.9 %   Platelet Count, POC 254 142 - 424 K/uL   MPV 7.1 0 - 99.8 fL    Dg Chest 2 View  Result Date: 05/02/2018 CLINICAL DATA:  Cough. EXAM: CHEST - 2 VIEW COMPARISON:  Radiographs of June 01, 2007. FINDINGS: The heart size and mediastinal contours are within normal limits. Both lungs are clear. The visualized skeletal structures are unremarkable. IMPRESSION: No active cardiopulmonary disease. Electronically Signed   By: Marijo Conception, M.D.   On: 05/02/2018 14:27    My interpretation of EKG:  NSR, HR 66, no st changes, normal intervals rhythm strip: PVC x 1 otherwise sinus, denies sx during that time  Peak flow reading is 550, about 89 % of predicted, green zone  ASSESSMENT and PLAN  1.  Costochondritis Discussed supportive measures, new meds r/se/b and RTC precautions.   2. Palpitations - EKG 12-Lead PVC on ekg   3. Essential hypertension Controlled. Continue current regime.  - Care order/instruction:  4. Cough Discussed exam and workup suggestive of viral bronchitis. Discussed supportive measures, new meds r/se/b and RTC precautions. Patient educational handout given. - DG Chest 2 View; Future - POCT CBC  Other orders - Check  Peak Flow - diclofenac (VOLTAREN) 75 MG EC tablet; Take 1 tablet (75 mg total) by mouth 2 (two) times daily. - benzonatate (TESSALON) 100 MG capsule; Take 1-2 capsules (100-200 mg total) by mouth 3 (three) times daily as needed for cough.    Return if symptoms worsen or fail to improve.    Rutherford Guys, MD [facility] [address], [place] [address] Ph.  [phone] Fax [phone]

## 2018-05-02 NOTE — Telephone Encounter (Signed)
  Additional Information . Commented on: [1] Intermittent  chest pain or "angina" AND [2] increasing in severity or frequency  (Exception: pains lasting a few seconds)    Patient called with possible angina- not able to finish triage- patient demanding appointment- declining ED- has furniture market and doesn't have time for this.  Protocols used: CHEST PAIN-A-AH

## 2018-05-02 NOTE — Telephone Encounter (Signed)
  Answer Assessment - Initial Assessment Questions 1. LOCATION: "Where does it hurt?"       Patient is having intermittent discomfort in chest-  Between nipple and center of chest left side 2. RADIATION: "Does the pain go anywhere else?" (e.g., into neck, jaw, arms, back)     No radiation 3. ONSET: "When did the chest pain begin?" (Minutes, hours or days)      3 days ago- patient has been coughing a lot 4. PATTERN "Does the pain come and go, or has it been constant since it started?"  "Does it get worse with exertion?"      Flash of pain- lasting second- happens intermittent 5. DURATION: "How long does it last" (e.g., seconds, minutes, hours)     Seconds- flashes 6. SEVERITY: "How bad is the pain?"  (e.g., Scale 1-10; mild, moderate, or severe)    - MILD (1-3): doesn't interfere with normal activities     - MODERATE (4-7): interferes with normal activities or awakens from sleep    - SEVERE (8-10): excruciating pain, unable to do any normal activities       5 7. CARDIAC RISK FACTORS: "Do you have any history of heart problems or risk factors for heart disease?" (e.g., prior heart attack, angina; high blood pressure, diabetes, being overweight, high cholesterol, smoking, or strong family history of heart disease)     Family history of heart disease, high blood pressure, over weight 8. PULMONARY RISK FACTORS: "Do you have any history of lung disease?"  (e.g., blood clots in lung, asthma, emphysema, birth control pills)     no 9. CAUSE: "What do you think is causing the chest pain?"     Patient thought it was due to cough- but now not sure 10. OTHER SYMPTOMS: "Do you have any other symptoms?" (e.g., dizziness, nausea, vomiting, sweating, fever, difficulty breathing, cough)       cough 11. PREGNANCY: "Is there any chance you are pregnant?" "When was your last menstrual period?"       n/a  Protocols used: CHEST PAIN-A-AH

## 2018-05-30 ENCOUNTER — Encounter: Payer: BC Managed Care – PPO | Admitting: Family Medicine

## 2018-05-30 ENCOUNTER — Telehealth: Payer: Self-pay | Admitting: Family Medicine

## 2018-05-30 NOTE — Telephone Encounter (Signed)
LVM for pt to call back to the office and reschedule this appt. The appointment day is Dr. Vonna Kotyk Same Day Doc Day and the appt slot was opened by accident. When pt calls back, please reschedule with Dr. Carlota Raspberry for a CPE at his convenience. Thank you.

## 2018-06-12 ENCOUNTER — Ambulatory Visit: Payer: BC Managed Care – PPO

## 2018-06-12 ENCOUNTER — Encounter: Payer: BC Managed Care – PPO | Admitting: Family Medicine

## 2018-06-12 DIAGNOSIS — I10 Essential (primary) hypertension: Secondary | ICD-10-CM

## 2018-06-12 DIAGNOSIS — E785 Hyperlipidemia, unspecified: Secondary | ICD-10-CM

## 2018-06-13 LAB — LIPID PANEL
CHOL/HDL RATIO: 2.6 ratio (ref 0.0–5.0)
Cholesterol, Total: 155 mg/dL (ref 100–199)
HDL: 59 mg/dL (ref >39)
LDL CALC: 75 mg/dL (ref 0–99)
Triglycerides: 104 mg/dL (ref 0–149)
VLDL Cholesterol Cal: 21 mg/dL (ref 5–40)

## 2018-06-13 LAB — COMPREHENSIVE METABOLIC PANEL
A/G RATIO: 2.6 — AB (ref 1.2–2.2)
ALBUMIN: 4.6 g/dL (ref 3.5–5.5)
ALK PHOS: 48 IU/L (ref 39–117)
ALT: 11 IU/L (ref 0–44)
AST: 13 IU/L (ref 0–40)
BILIRUBIN TOTAL: 1.2 mg/dL (ref 0.0–1.2)
BUN / CREAT RATIO: 12 (ref 9–20)
BUN: 10 mg/dL (ref 6–24)
CO2: 23 mmol/L (ref 20–29)
CREATININE: 0.84 mg/dL (ref 0.76–1.27)
Calcium: 9.4 mg/dL (ref 8.7–10.2)
Chloride: 103 mmol/L (ref 96–106)
GFR calc Af Amer: 112 mL/min/{1.73_m2} (ref >59)
GFR calc non Af Amer: 97 mL/min/{1.73_m2} (ref >59)
GLOBULIN, TOTAL: 1.8 g/dL (ref 1.5–4.5)
Glucose: 78 mg/dL (ref 65–99)
POTASSIUM: 3.9 mmol/L (ref 3.5–5.2)
SODIUM: 140 mmol/L (ref 134–144)
Total Protein: 6.4 g/dL (ref 6.0–8.5)

## 2018-06-13 LAB — HEMOGLOBIN A1C
Est. average glucose Bld gHb Est-mCnc: 117 mg/dL
Hgb A1c MFr Bld: 5.7 % — ABNORMAL HIGH (ref 4.8–5.6)

## 2018-06-23 ENCOUNTER — Encounter: Payer: Self-pay | Admitting: Family Medicine

## 2018-06-23 ENCOUNTER — Ambulatory Visit (INDEPENDENT_AMBULATORY_CARE_PROVIDER_SITE_OTHER): Payer: BC Managed Care – PPO | Admitting: Family Medicine

## 2018-06-23 VITALS — BP 145/84 | HR 63 | Temp 97.8°F | Resp 16 | Ht 74.0 in | Wt 291.0 lb

## 2018-06-23 DIAGNOSIS — M2550 Pain in unspecified joint: Secondary | ICD-10-CM | POA: Diagnosis not present

## 2018-06-23 DIAGNOSIS — Z0001 Encounter for general adult medical examination with abnormal findings: Secondary | ICD-10-CM

## 2018-06-23 DIAGNOSIS — N529 Male erectile dysfunction, unspecified: Secondary | ICD-10-CM | POA: Diagnosis not present

## 2018-06-23 DIAGNOSIS — R21 Rash and other nonspecific skin eruption: Secondary | ICD-10-CM

## 2018-06-23 DIAGNOSIS — I10 Essential (primary) hypertension: Secondary | ICD-10-CM | POA: Diagnosis not present

## 2018-06-23 DIAGNOSIS — R7303 Prediabetes: Secondary | ICD-10-CM

## 2018-06-23 DIAGNOSIS — Z6837 Body mass index (BMI) 37.0-37.9, adult: Secondary | ICD-10-CM

## 2018-06-23 DIAGNOSIS — E669 Obesity, unspecified: Secondary | ICD-10-CM

## 2018-06-23 DIAGNOSIS — L409 Psoriasis, unspecified: Secondary | ICD-10-CM

## 2018-06-23 DIAGNOSIS — Z0000 Encounter for general adult medical examination without abnormal findings: Secondary | ICD-10-CM

## 2018-06-23 MED ORDER — LISINOPRIL 10 MG PO TABS: 10.0000 mg | ORAL_TABLET | Freq: Every day | ORAL | 1 refills | Status: DC

## 2018-06-23 MED ORDER — METOPROLOL SUCCINATE ER 25 MG PO TB24: 25.0000 mg | ORAL_TABLET | Freq: Every day | ORAL | 1 refills | Status: DC

## 2018-06-23 MED ORDER — SILDENAFIL CITRATE 20 MG PO TABS: ORAL_TABLET | ORAL | 3 refills | Status: DC

## 2018-06-23 MED ORDER — MELOXICAM 7.5 MG PO TABS: 7.5000 mg | ORAL_TABLET | Freq: Every day | ORAL | 0 refills | Status: DC

## 2018-06-23 MED ORDER — NYSTATIN-TRIAMCINOLONE 100000-0.1 UNIT/GM-% EX OINT: TOPICAL_OINTMENT | CUTANEOUS | 0 refills | Status: DC

## 2018-06-23 NOTE — Patient Instructions (Addendum)
Continue lotion for rash and dry skin, as well as steroid creams. Use gloves with washing dishes. Switch dial soap to Dove/Dove for men. Follow up with dermatology if needed.   I will check a few lab tests for joint pain and swelling, then con follow up to decide on meds/further eval or may need to refer you to rheumatology.  With psoriasis could also have psoriatic arthritis which may need different treatment. Can try meloxicam once per day in place of advil for now, but short course initially.  No other NSAIDS while on mobic, tylenol ok.   Let me know if you would like to have prostate cancer screening.   Try to pack lunch/meals for on the road to lessen fast food/restaurant food. Recheck A1c in 6 months for prediabetes.   Labs overall looked ok.    Prediabetes Prediabetes is the condition of having a blood sugar (blood glucose) level that is higher than it should be, but not high enough for you to be diagnosed with type 2 diabetes. Having prediabetes puts you at risk for developing type 2 diabetes (type 2 diabetes mellitus). Prediabetes may be called impaired glucose tolerance or impaired fasting glucose. Prediabetes usually does not cause symptoms. Your health care provider can diagnose this condition with blood tests. You may be tested for prediabetes if you are overweight and if you have at least one other risk factor for prediabetes. Risk factors for prediabetes include:  Having a family member with type 2 diabetes.  Being overweight or obese.  Being older than age 49.  Being of American-Indian, African-American, Hispanic/Latino, or Asian/Pacific Islander descent.  Having an inactive (sedentary) lifestyle.  Having a history of gestational diabetes or polycystic ovarian syndrome (PCOS).  Having low levels of good cholesterol (HDL-C) or high levels of blood fats (triglycerides).  Having high blood pressure.  What is blood glucose and how is blood glucose measured?  Blood  glucose refers to the amount of glucose in your bloodstream. Glucose comes from eating foods that contain sugars and starches (carbohydrates) that the body breaks down into glucose. Your blood glucose level may be measured in mg/dL (milligrams per deciliter) or mmol/L (millimoles per liter).Your blood glucose may be checked with one or more of the following blood tests:  A fasting blood glucose (FBG) test. You will not be allowed to eat (you will fast) for at least 8 hours before a blood sample is taken. ? A normal range for FBG is 70-100 mg/dl (3.9-5.6 mmol/L).  An A1c (hemoglobin A1c) blood test. This test provides information about blood glucose control over the previous 2?32months.  An oral glucose tolerance test (OGTT). This test measures your blood glucose twice: ? After fasting. This is your baseline level. ? Two hours after you drink a beverage that contains glucose.  You may be diagnosed with prediabetes:  If your FBG is 100?125 mg/dL (5.6-6.9 mmol/L).  If your A1c level is 5.7?6.4%.  If your OGGT result is 140?199 mg/dL (7.8-11 mmol/L).  These blood tests may be repeated to confirm your diagnosis. What happens if blood glucose is too high? The pancreas produces a hormone (insulin) that helps move glucose from the bloodstream into cells. When cells in the body do not respond properly to insulin that the body makes (insulin resistance), excess glucose builds up in the blood instead of going into cells. As a result, high blood glucose (hyperglycemia) can develop, which can cause many complications. This is a symptom of prediabetes. What can happen if blood  glucose stays higher than normal for a long time? Having high blood glucose for a long time is dangerous. Too much glucose in your blood can damage your nerves and blood vessels. Long-term damage can lead to complications from diabetes, which may include:  Heart disease.  Stroke.  Blindness.  Kidney  disease.  Depression.  Poor circulation in the feet and legs, which could lead to surgical removal (amputation) in severe cases.  How can prediabetes be prevented from turning into type 2 diabetes?  To help prevent type 2 diabetes, take the following actions:  Be physically active. ? Do moderate-intensity physical activity for at least 30 minutes on at least 5 days of the week, or as much as told by your health care provider. This could be brisk walking, biking, or water aerobics. ? Ask your health care provider what activities are safe for you. A mix of physical activities may be best, such as walking, swimming, cycling, and strength training.  Lose weight as told by your health care provider. ? Losing 5-7% of your body weight can reverse insulin resistance. ? Your health care provider can determine how much weight loss is best for you and can help you lose weight safely.  Follow a healthy meal plan. This includes eating lean proteins, complex carbohydrates, fresh fruits and vegetables, low-fat dairy products, and healthy fats. ? Follow instructions from your health care provider about eating or drinking restrictions. ? Make an appointment to see a diet and nutrition specialist (registered dietitian) to help you create a healthy eating plan that is right for you.  Do not smoke or use any tobacco products, such as cigarettes, chewing tobacco, and e-cigarettes. If you need help quitting, ask your health care provider.  Take over-the-counter and prescription medicines as told by your health care provider. You may be prescribed medicines that help lower the risk of type 2 diabetes.  This information is not intended to replace advice given to you by your health care provider. Make sure you discuss any questions you have with your health care provider. Document Released: 10/27/2015 Document Revised: 12/11/2015 Document Reviewed: 08/26/2015 Elsevier Interactive Patient Education  2018 [place] you healthy  Get these tests  Blood pressure- Have your blood pressure checked once a year by your healthcare provider.  Normal blood pressure is 120/80  Weight- Have your body mass index (BMI) calculated to screen for obesity.  BMI is a measure of body fat based on height and weight. You can also calculate your own BMI at [email or web address].  Cholesterol- Have your cholesterol checked every year.  Diabetes- Have your blood sugar checked regularly if you have high blood pressure, high cholesterol, have a family history of diabetes or if you are overweight.  Screening for Colon Cancer- Colonoscopy starting at age 21.  Screening may begin sooner depending on your family history and other health conditions. Follow up colonoscopy as directed by your Gastroenterologist.  Screening for Prostate Cancer- Both blood work (PSA) and a rectal exam help screen for Prostate Cancer.  Screening begins at age 16 with African-American men and at age 83 with Caucasian men.  Screening may begin sooner depending on your family history.  Take these medicines  Aspirin- One aspirin daily can help prevent Heart disease and Stroke.  Flu shot- Every fall.  Tetanus- Every 10 years.  Zostavax- Once after the age of 59 to prevent Shingles.  Pneumonia shot- Once after the age of 66; if you are younger than 31,  ask your healthcare provider if you need a Pneumonia shot.  Take these steps  Don't smoke- If you do smoke, talk to your doctor about quitting.  For tips on how to quit, go to www.smokefree.gov or call 1-800-QUIT-NOW.  Be physically active- Exercise 5 days a week for at least 30 minutes.  If you are not already physically active start slow and gradually work up to 30 minutes of moderate physical activity.  Examples of moderate activity include walking briskly, mowing the yard, dancing, swimming, bicycling, etc.  Eat a healthy diet- Eat a variety of healthy food such as fruits,  vegetables, low fat milk, low fat cheese, yogurt, lean meant, poultry, fish, beans, tofu, etc. For more information go to www.thenutritionsource.org  Drink alcohol in moderation- Limit alcohol intake to less than two drinks a day. Never drink and drive.  Dentist- Brush and floss twice daily; visit your dentist twice a year.  Depression- Your emotional health is as important as your physical health. If you're feeling down, or losing interest in things you would normally enjoy please talk to your healthcare provider.  Eye exam- Visit your eye doctor every year.  Safe sex- If you may be exposed to a sexually transmitted infection, use a condom.  Seat belts- Seat belts can save your life; always wear one.  Smoke/Carbon Monoxide detectors- These detectors need to be installed on the appropriate level of your home.  Replace batteries at least once a year.  Skin cancer- When out in the sun, cover up and use sunscreen 15 SPF or higher.  Violence- If anyone is threatening you, please tell your healthcare provider.  Living Will/ Health care power of attorney- Speak with your healthcare provider and family.   If you have lab work done today you will be contacted with your lab results within the next 2 weeks.  If you have not heard from [place] then please contact us. The fastest way to get your results is to register for My Chart.   IF you received an x-ray today, you will receive an invoice from [facility]. Please contact [facility] at [phone] with questions or concerns regarding your invoice.   IF you received labwork today, you will receive an invoice from [facility]. Please contact [facility] at [phone] with questions or concerns regarding your invoice.   Our billing staff will not be able to assist you with questions regarding bills from these companies.  You will be contacted with the lab results as soon as they are available. The fastest way to get your results is to  activate your My Chart account. Instructions are located on the last page of this paperwork. If you have not heard from [place] regarding the results in 2 weeks, please contact this office.

## 2018-06-23 NOTE — Progress Notes (Signed)
Subjective:    Patient ID: Nathaniel Velazquez, male    DOB: Jan 27, 1961, 57 y.o.   MRN: [id number]  HPI Nathaniel Velazquez is a 57 y.o. male Presents today for: Chief Complaint  Patient presents with  . Annual Exam   Hypertension: BP Readings from Last 3 Encounters:  06/23/18 (!) 145/84  05/02/18 140/78  02/21/18 (!) 113/59   Lab Results  Component Value Date   CREATININE 0.84 06/12/2018  Takes lisinopril 10 mg daily, metoprolol 25 mg daily. Home BP 120-130's. No new med side effects.   Erectile dysfunction: Has used sildenafil 20 mg as needed - using about once per week. One at a time. No cp, ha, flushing, hearing or vision issues.   Colonoscopy: 02/21/18, repeat 3 years Prostate cancer screening/last PSA: No recent testing. Pros and cons of testing discussed. Would like to defer testing. No FH of prostate CA.   Immunizations: Immunization History  Administered Date(s) Administered  . Influenza, Quadrivalent, Recombinant, Inj, Pf 06/05/2018  . Influenza,inj,Quad PF,6+ Mos 05/17/2013, 04/10/2014, 04/23/2015, 04/06/2017  . Influenza-Unspecified 05/19/2016, 05/18/2018  . Td 07/19/1998  . Tdap 04/10/2014   Depression screen: Depression screen [facility] 2/9 06/23/2018 06/23/2018 05/02/2018 11/24/2017 04/21/2017  Decreased Interest 0 0 0 0 0  Down, Depressed, Hopeless 0 0 0 0 0  PHQ - 2 Score 0 0 0 0 0   Vision screen:  Visual Acuity Screening   Right eye Left eye Both eyes  Without correction:     With correction: 20/15 20/20 20/15   last optho past few months.   Dentist: every 6 months.   Obesity/Prediabetes/Exercise: limited by loint pain, but gym 3x/week, walking, yardwork.  Body mass index is 37.36 kg/m. Wt Readings from Last 3 Encounters:  06/23/18 291 lb (132 kg)  05/02/18 295 lb (133.8 kg)  02/21/18 294 lb (133.4 kg)   Lab Results  Component Value Date   HGBA1C 5.7 (H) 06/12/2018  no sweet tea, diet soda. Some fast food. Driving.  restaurant food at times.      Recent screening labs: Results for orders placed or performed in visit on 06/12/18  Comprehensive metabolic panel  Result Value Ref Range   Glucose 78 65 - 99 mg/dL   BUN 10 6 - 24 mg/dL   Creatinine, Ser 0.84 0.76 - 1.27 mg/dL   GFR calc non Af Amer 97 >59 mL/min/1.73   GFR calc Af Amer 112 >59 mL/min/1.73   BUN/Creatinine Ratio 12 9 - 20   Sodium 140 134 - 144 mmol/L   Potassium 3.9 3.5 - 5.2 mmol/L   Chloride 103 96 - 106 mmol/L   CO2 23 20 - 29 mmol/L   Calcium 9.4 8.7 - 10.2 mg/dL   Total Protein 6.4 6.0 - 8.5 g/dL   Albumin 4.6 3.5 - 5.5 g/dL   Globulin, Total 1.8 1.5 - 4.5 g/dL   Albumin/Globulin Ratio 2.6 (H) 1.2 - 2.2   Bilirubin Total 1.2 0.0 - 1.2 mg/dL   Alkaline Phosphatase 48 39 - 117 IU/L   AST 13 0 - 40 IU/L   ALT 11 0 - 44 IU/L  Lipid panel  Result Value Ref Range   Cholesterol, Total 155 100 - 199 mg/dL   Triglycerides 104 0 - 149 mg/dL   HDL 59 >39 mg/dL   VLDL Cholesterol Cal 21 5 - 40 mg/dL   LDL Calculated 75 0 - 99 mg/dL   Chol/HDL Ratio 2.6 0.0 - 5.0 ratio  Hemoglobin A1c  Result Value Ref  Range   Hgb A1c MFr Bld 5.7 (H) 4.8 - 5.6 %   Est. average glucose Bld gHb Est-mCnc 117 mg/dL       Patient Active Problem List   Diagnosis Date Noted  . Rectal pain 06/05/2015  . Hypertension   . Obesity   . Hyperlipidemia 11/21/2008  . PALPITATIONS 10/30/2008   Past Medical History:  Diagnosis Date  . Arthritis   . GERD (gastroesophageal reflux disease)   . Hypertension   . Obesity    Steroid injection in rt knee   Past Surgical History:  Procedure Laterality Date  . ANAL FISTULECTOMY  1987  . APPENDECTOMY    . TONSILLECTOMY     Allergies  Allergen Reactions  . Gabapentin Other (See Comments)    Made very jittery   Prior to Admission medications   Medication Sig Start Date End Date Taking? Authorizing Provider  clobetasol ointment (TEMOVATE) 0.05 % As directed 05/15/12  Yes [provider]  lisinopril (PRINIVIL,ZESTRIL)  10 MG tablet Take 1 tablet (10 mg total) by mouth daily. 11/24/17  Yes [name], MD  metoprolol succinate (TOPROL-XL) 25 MG 24 hr tablet Take 1 tablet (25 mg total) by mouth daily. 11/24/17  Yes [name], MD  nystatin-triamcinolone ointment (MYCOLOG) APPLY SPARINGLY TO THE AFFECTED AREA FOR UP TO 7 DAYS 11/18/15  Yes Elby Beck, FNP  psyllium (METAMUCIL) 58.6 % powder Take 1 packet by mouth 3 (three) times daily.   Yes [provider]  sildenafil (REVATIO) 20 MG tablet Take 20 mg by mouth 3 (three) times daily.   Yes [provider]   Social History   Socioeconomic History  . Marital status: Married    Spouse name: Not on file  . Number of children: Not on file  . Years of education: Not on file  . Highest education level: Not on file  Occupational History  . Not on file  Social Needs  . Financial resource strain: Not on file  . Food insecurity:    Worry: Not on file    Inability: Not on file  . Transportation needs:    Medical: Not on file    Non-medical: Not on file  Tobacco Use  . Smoking status: Never Smoker  . Smokeless tobacco: Never Used  Substance and Sexual Activity  . Alcohol use: Yes    Alcohol/week: 21.0 standard drinks    Types: 21 Shots of liquor per week  . Drug use: No  . Sexual activity: Never  Lifestyle  . Physical activity:    Days per week: Not on file    Minutes per session: Not on file  . Stress: Not on file  Relationships  . Social connections:    Talks on phone: Not on file    Gets together: Not on file    Attends religious service: Not on file    Active member of club or organization: Not on file    Attends meetings of clubs or organizations: Not on file    Relationship status: Not on file  . Intimate partner violence:    Fear of current or ex partner: Not on file    Emotionally abused: Not on file    Physically abused: Not on file    Forced sexual activity: Not on file  Other Topics Concern  . Not on  file  Social History Narrative   Sales rep    Review of Systems  Musculoskeletal: Positive for arthralgias (more joint pains recenlty,  R thumb knuckle, left 3rd MCp. possible swelling., years, but worse past 31months.  L knee swollen last week. NKI. ) and joint swelling.  Skin: Positive for rash (psoriasis, uses temovate cream, dry skin on FT's, - derm follows. ).   advil 600mg  in am, sometimes second dose.  last saw derm earlier this year.  Washes dishes w/o gloves.  Dial soap.  Son with autoimmune arthritis, dtr with possible autoimmune disorder    Objective:   Physical Exam  Constitutional: He is oriented to person, place, and time. He appears well-developed and well-nourished.  HENT:  Head: Normocephalic and atraumatic.  Right Ear: External ear normal.  Left Ear: External ear normal.  Mouth/Throat: Oropharynx is clear and moist.  Eyes: Pupils are equal, round, and reactive to light. Conjunctivae and EOM are normal.  Neck: Normal range of motion. Neck supple. No thyromegaly present.  Cardiovascular: Normal rate, regular rhythm, normal heart sounds and intact distal pulses.  Pulmonary/Chest: Effort normal and breath sounds normal. No respiratory distress. He has no wheezes.  Abdominal: Soft. He exhibits no distension. There is no tenderness.  Musculoskeletal: Normal range of motion. He exhibits no edema or tenderness.  No focal bony tenderness of left knee, slight effusion, skin intact without erythema.  Overall intact range of motion and strength.  Left hand, no apparent MCP swelling.  Lymphadenopathy:    He has no cervical adenopathy.  Neurological: He is alert and oriented to person, place, and time. He has normal reflexes.  Skin: Skin is warm and dry.     Psychiatric: He has a normal mood and affect. His behavior is normal.  Vitals reviewed.  Vitals:   06/23/18 1112  BP: (!) 145/84  Pulse: 63  Resp: 16  Temp: 97.8 F (36.6 C)  TempSrc: Oral  SpO2: 95%  Weight:  291 lb (132 kg)  Height: 6\' 2"  (1.88 m)      Assessment & Plan:  Nathaniel Velazquez is a 57 y.o. male Annual physical exam  --anticipatory guidance as below in AVS, screening labs above. Health maintenance items as above in HPI discussed/recommended as applicable.   Prediabetes  -Handout given, diet/exercise discussed.  Essential hypertension - Plan: lisinopril (PRINIVIL,ZESTRIL) 10 MG tablet, metoprolol succinate (TOPROL-XL) 25 MG 24 hr tablet, DISCONTINUED: metoprolol succinate (TOPROL-XL) 25 MG 24 hr tablet, DISCONTINUED: lisinopril (PRINIVIL,ZESTRIL) 10 MG tablet  - Stable, tolerating current regimen. Medications refilled.   Polyarthralgia - Plan: Sedimentation Rate, ANA,IFA RA Diag Pnl w/rflx Tit/Patn, Uric Acid, C-reactive protein, meloxicam (MOBIC) 7.5 MG tablet, DISCONTINUED: meloxicam (MOBIC) 7.5 MG tablet Psoriasis Rash of hands  -Check inflammatory tests as above for polyarthralgia, but with history of psoriasis could be component of psoriatic arthritis and may need rheumatology eval for oral medication treatment.  -Short-term trial of meloxicam, long-term side effects/risk discussed  -Continue topical treatments for psoriasis and rashes along with dermatology follow-up  Erectile dysfunction, unspecified erectile dysfunction type - Plan: sildenafil (REVATIO) 20 MG tablet  -Sildenafil Rx given - use lowest effective dose.  Off label use discussed, side effects discussed (including but not limited to headache/flushing, blue discoloration of vision, possible vascular steal and risk of cardiac effects if underlying unknown coronary artery disease, and permanent sensorineural hearing loss). Understanding expressed.  Class 2 obesity with body mass index (BMI) of 37.0 to 37.9 in adult, unspecified obesity type, unspecified whether serious comorbidity present  -Exercise, diet discussed including packing meals when he is on the road to lessen risk of higher calorie foods/fast food.  Meds  ordered this encounter  Medications  . DISCONTD: meloxicam (MOBIC) 7.5 MG tablet    Sig: Take 1-2 tablets (7.5-15 mg total) by mouth daily.    Dispense:  30 tablet    Refill:  0  . sildenafil (REVATIO) 20 MG tablet    Sig: 1-3 po qd prn.    Dispense:  10 tablet    Refill:  3  . DISCONTD: metoprolol succinate (TOPROL-XL) 25 MG 24 hr tablet    Sig: Take 1 tablet (25 mg total) by mouth daily.    Dispense:  90 tablet    Refill:  1  . DISCONTD: lisinopril (PRINIVIL,ZESTRIL) 10 MG tablet    Sig: Take 1 tablet (10 mg total) by mouth daily.    Dispense:  90 tablet    Refill:  1  . lisinopril (PRINIVIL,ZESTRIL) 10 MG tablet    Sig: Take 1 tablet (10 mg total) by mouth daily.    Dispense:  90 tablet    Refill:  1  . metoprolol succinate (TOPROL-XL) 25 MG 24 hr tablet    Sig: Take 1 tablet (25 mg total) by mouth daily.    Dispense:  90 tablet    Refill:  1  . nystatin-triamcinolone ointment (MYCOLOG)    Sig: APPLY SPARINGLY TO THE AFFECTED AREA FOR UP TO 7 DAYS    Dispense:  30 g    Refill:  0    Office visit needed for additional refills  . meloxicam (MOBIC) 7.5 MG tablet    Sig: Take 1-2 tablets (7.5-15 mg total) by mouth daily.    Dispense:  30 tablet    Refill:  0   Patient Instructions   Continue lotion for rash and dry skin, as well as steroid creams. Use gloves with washing dishes. Switch dial soap to Dove/Dove for men. Follow up with dermatology if needed.   I will check a few lab tests for joint pain and swelling, then con follow up to decide on meds/further eval or may need to refer you to rheumatology.  With psoriasis could also have psoriatic arthritis which may need different treatment. Can try meloxicam once per day in place of advil for now, but short course initially.  No other NSAIDS while on mobic, tylenol ok.   Let me know if you would like to have prostate cancer screening.   Try to pack lunch/meals for on the road to lessen fast food/restaurant food. Recheck A1c  in 6 months for prediabetes.   Labs overall looked ok.    Prediabetes Prediabetes is the condition of having a blood sugar (blood glucose) level that is higher than it should be, but not high enough for you to be diagnosed with type 2 diabetes. Having prediabetes puts you at risk for developing type 2 diabetes (type 2 diabetes mellitus). Prediabetes may be called impaired glucose tolerance or impaired fasting glucose. Prediabetes usually does not cause symptoms. Your health care provider can diagnose this condition with blood tests. You may be tested for prediabetes if you are overweight and if you have at least one other risk factor for prediabetes. Risk factors for prediabetes include:  Having a family member with type 2 diabetes.  Being overweight or obese.  Being older than age 35.  Being of American-Indian, African-American, Hispanic/Latino, or Asian/Pacific Islander descent.  Having an inactive (sedentary) lifestyle.  Having a history of gestational diabetes or polycystic ovarian syndrome (PCOS).  Having low levels of good cholesterol (HDL-C) or high levels of blood fats (  triglycerides).  Having high blood pressure.  What is blood glucose and how is blood glucose measured?  Blood glucose refers to the amount of glucose in your bloodstream. Glucose comes from eating foods that contain sugars and starches (carbohydrates) that the body breaks down into glucose. Your blood glucose level may be measured in mg/dL (milligrams per deciliter) or mmol/L (millimoles per liter).Your blood glucose may be checked with one or more of the following blood tests:  A fasting blood glucose (FBG) test. You will not be allowed to eat (you will fast) for at least 8 hours before a blood sample is taken. ? A normal range for FBG is 70-100 mg/dl (3.9-5.6 mmol/L).  An A1c (hemoglobin A1c) blood test. This test provides information about blood glucose control over the previous 2?3months.  An oral  glucose tolerance test (OGTT). This test measures your blood glucose twice: ? After fasting. This is your baseline level. ? Two hours after you drink a beverage that contains glucose.  You may be diagnosed with prediabetes:  If your FBG is 100?125 mg/dL (5.6-6.9 mmol/L).  If your A1c level is 5.7?6.4%.  If your OGGT result is 140?199 mg/dL (7.8-11 mmol/L).  These blood tests may be repeated to confirm your diagnosis. What happens if blood glucose is too high? The pancreas produces a hormone (insulin) that helps move glucose from the bloodstream into cells. When cells in the body do not respond properly to insulin that the body makes (insulin resistance), excess glucose builds up in the blood instead of going into cells. As a result, high blood glucose (hyperglycemia) can develop, which can cause many complications. This is a symptom of prediabetes. What can happen if blood glucose stays higher than normal for a long time? Having high blood glucose for a long time is dangerous. Too much glucose in your blood can damage your nerves and blood vessels. Long-term damage can lead to complications from diabetes, which may include:  Heart disease.  Stroke.  Blindness.  Kidney disease.  Depression.  Poor circulation in the feet and legs, which could lead to surgical removal (amputation) in severe cases.  How can prediabetes be prevented from turning into type 2 diabetes?  To help prevent type 2 diabetes, take the following actions:  Be physically active. ? Do moderate-intensity physical activity for at least 30 minutes on at least 5 days of the week, or as much as told by your health care provider. This could be brisk walking, biking, or water aerobics. ? Ask your health care provider what activities are safe for you. A mix of physical activities may be best, such as walking, swimming, cycling, and strength training.  Lose weight as told by your health care provider. ? Losing 5-7% of  your body weight can reverse insulin resistance. ? Your health care provider can determine how much weight loss is best for you and can help you lose weight safely.  Follow a healthy meal plan. This includes eating lean proteins, complex carbohydrates, fresh fruits and vegetables, low-fat dairy products, and healthy fats. ? Follow instructions from your health care provider about eating or drinking restrictions. ? Make an appointment to see a diet and nutrition specialist (registered dietitian) to help you create a healthy eating plan that is right for you.  Do not smoke or use any tobacco products, such as cigarettes, chewing tobacco, and e-cigarettes. If you need help quitting, ask your health care provider.  Take over-the-counter and prescription medicines as told by your health care  provider. You may be prescribed medicines that help lower the risk of type 2 diabetes.  This information is not intended to replace advice given to you by your health care provider. Make sure you discuss any questions you have with your health care provider. Document Released: 10/27/2015 Document Revised: 12/11/2015 Document Reviewed: 08/26/2015 Elsevier Interactive Patient Education  2018 [place] you healthy  Get these tests  Blood pressure- Have your blood pressure checked once a year by your healthcare provider.  Normal blood pressure is 120/80  Weight- Have your body mass index (BMI) calculated to screen for obesity.  BMI is a measure of body fat based on height and weight. You can also calculate your own BMI at ViewBanking.[email or web address].  Cholesterol- Have your cholesterol checked every year.  Diabetes- Have your blood sugar checked regularly if you have high blood pressure, high cholesterol, have a family history of diabetes or if you are overweight.  Screening for Colon Cancer- Colonoscopy starting at age 68.  Screening may begin sooner depending on your family history and other  health conditions. Follow up colonoscopy as directed by your Gastroenterologist.  Screening for Prostate Cancer- Both blood work (PSA) and a rectal exam help screen for Prostate Cancer.  Screening begins at age 10 with African-American men and at age 72 with Caucasian men.  Screening may begin sooner depending on your family history.  Take these medicines  Aspirin- One aspirin daily can help prevent Heart disease and Stroke.  Flu shot- Every fall.  Tetanus- Every 10 years.  Zostavax- Once after the age of 60 to prevent Shingles.  Pneumonia shot- Once after the age of 73; if you are younger than 28, ask your healthcare provider if you need a Pneumonia shot.  Take these steps  Don't smoke- If you do smoke, talk to your doctor about quitting.  For tips on how to quit, go to www.smokefree.gov or call 1-800-QUIT-NOW.  Be physically active- Exercise 5 days a week for at least 30 minutes.  If you are not already physically active start slow and gradually work up to 30 minutes of moderate physical activity.  Examples of moderate activity include walking briskly, mowing the yard, dancing, swimming, bicycling, etc.  Eat a healthy diet- Eat a variety of healthy food such as fruits, vegetables, low fat milk, low fat cheese, yogurt, lean meant, poultry, fish, beans, tofu, etc. For more information go to www.thenutritionsource.org  Drink alcohol in moderation- Limit alcohol intake to less than two drinks a day. Never drink and drive.  Dentist- Brush and floss twice daily; visit your dentist twice a year.  Depression- Your emotional health is as important as your physical health. If you're feeling down, or losing interest in things you would normally enjoy please talk to your healthcare provider.  Eye exam- Visit your eye doctor every year.  Safe sex- If you may be exposed to a sexually transmitted infection, use a condom.  Seat belts- Seat belts can save your life; always wear  one.  Smoke/Carbon Monoxide detectors- These detectors need to be installed on the appropriate level of your home.  Replace batteries at least once a year.  Skin cancer- When out in the sun, cover up and use sunscreen 15 SPF or higher.  Violence- If anyone is threatening you, please tell your healthcare provider.  Living Will/ Health care power of attorney- Speak with your healthcare provider and family.   If you have lab work done today you will be contacted with  your lab results within the next 2 weeks.  If you have not heard from [place] then please contact us. The fastest way to get your results is to register for My Chart.   IF you received an x-ray today, you will receive an invoice from [facility]. Please contact [facility] at [phone] with questions or concerns regarding your invoice.   IF you received labwork today, you will receive an invoice from [facility]. Please contact [facility] at [phone] with questions or concerns regarding your invoice.   Our billing staff will not be able to assist you with questions regarding bills from these companies.  You will be contacted with the lab results as soon as they are available. The fastest way to get your results is to activate your My Chart account. Instructions are located on the last page of this paperwork. If you have not heard from [place] regarding the results in 2 weeks, please contact this office.       Signed,   [name], MD [facility] [place].  06/25/18 1:52 PM

## 2018-06-26 LAB — ANA,IFA RA DIAG PNL W/RFLX TIT/PATN
ANA TITER 1: NEGATIVE
Cyclic Citrullin Peptide Ab: 8 units (ref 0–19)
Rhuematoid fact SerPl-aCnc: <10 IU/mL (ref 0.0–13.9)

## 2018-06-26 LAB — C-REACTIVE PROTEIN: CRP: 3 mg/L (ref 0–10)

## 2018-06-26 LAB — URIC ACID: URIC ACID: 7.5 mg/dL (ref 3.7–8.6)

## 2018-06-26 LAB — SEDIMENTATION RATE: SED RATE: 20 mm/h (ref 0–30)

## 2018-07-28 ENCOUNTER — Ambulatory Visit (INDEPENDENT_AMBULATORY_CARE_PROVIDER_SITE_OTHER): Payer: BC Managed Care – PPO

## 2018-07-28 ENCOUNTER — Ambulatory Visit: Payer: BC Managed Care – PPO | Admitting: Family Medicine

## 2018-07-28 ENCOUNTER — Other Ambulatory Visit: Payer: Self-pay

## 2018-07-28 VITALS — BP 130/60 | HR 75 | Temp 98.5°F | Ht 74.5 in | Wt 294.5 lb

## 2018-07-28 DIAGNOSIS — M25441 Effusion, right hand: Secondary | ICD-10-CM

## 2018-07-28 DIAGNOSIS — M25462 Effusion, left knee: Secondary | ICD-10-CM

## 2018-07-28 DIAGNOSIS — M79644 Pain in right finger(s): Secondary | ICD-10-CM

## 2018-07-28 DIAGNOSIS — L409 Psoriasis, unspecified: Secondary | ICD-10-CM | POA: Diagnosis not present

## 2018-07-28 DIAGNOSIS — M79645 Pain in left finger(s): Secondary | ICD-10-CM | POA: Diagnosis not present

## 2018-07-28 DIAGNOSIS — M25562 Pain in left knee: Secondary | ICD-10-CM | POA: Diagnosis not present

## 2018-07-28 NOTE — Progress Notes (Signed)
By signing my name below, I, .Temidayo Atanda-Ogunleye, attest that this documentation has been prepared under the direction and in the presence of [name], MD. Electronically Signed: Stephania Fragmin, Scribe 07/28/2018 at 2:59 PM.  Subjective:    Patient ID: Nathaniel Velazquez, male    DOB: [birth year], 58 y.o.   MRN: [id number] Chief Complaint  Patient presents with  . Joint Swelling    hands and knees f/u     HPI Nathaniel Velazquez is a 58 y.o. male who presents to Primary Care at [facility] for a follow up of joint pain/swelling. Discussed at his physical on 06/23/2018. He had small erythimatous patches with scales on hands and cracking skin on fingertipes. Reported various joint swelling for years that worsened in 6 months (right thumb, left thirds metacarpal, and left knee swelling) without injury. History of psoriasis, possible psoriatic arthritis, intially treated with mobic 7.5mg  KD. Overall normal uric acid results, negative ana, and normal crp.    Joint Swelling Patient reports taking mobic regularly (every 12 hours) for 3 weeks and noted no change in hands. Although, he admitted some improvement in knee swelling and pain. Started taking Advil again instead. He takes 3-4 pills in the morning and 3 in the afternoon. He feels this is better at managing his pain. Right 5th DIP and right thumb pain waxes and wanes Left third MCP is worse in the morning.  He is followed by dermatology, but has not seen rheumatology.  Past Medical History:  Diagnosis Date  . Arthritis   . GERD (gastroesophageal reflux disease)   . Hypertension   . Obesity    Steroid injection in rt knee   Past Surgical History:  Procedure Laterality Date  . ANAL FISTULECTOMY  1987  . APPENDECTOMY    . TONSILLECTOMY     Allergies  Allergen Reactions  . Gabapentin Other (See Comments)    Made very jittery   Prior to Admission medications   Medication Sig Start Date End Date Taking? Authorizing  Provider  clobetasol ointment (TEMOVATE) 0.05 % As directed 05/15/12  Yes [provider]  lisinopril (PRINIVIL,ZESTRIL) 10 MG tablet Take 1 tablet (10 mg total) by mouth daily. 06/23/18  Yes [name], MD  meloxicam (MOBIC) 7.5 MG tablet Take 1-2 tablets (7.5-15 mg total) by mouth daily. 06/23/18  Yes [name], MD  metoprolol succinate (TOPROL-XL) 25 MG 24 hr tablet Take 1 tablet (25 mg total) by mouth daily. 06/23/18  Yes [name], MD  nystatin-triamcinolone ointment (MYCOLOG) APPLY SPARINGLY TO THE AFFECTED AREA FOR UP TO 7 DAYS 06/23/18  Yes [name], MD  psyllium (METAMUCIL) 58.6 % powder Take 1 packet by mouth 3 (three) times daily.   Yes [provider]  sildenafil (REVATIO) 20 MG tablet 1-3 po qd prn. 06/23/18  Yes [name], MD   Social History   Socioeconomic History  . Marital status: Married    Spouse name: Not on file  . Number of children: Not on file  . Years of education: Not on file  . Highest education level: Not on file  Occupational History  . Not on file  Social Needs  . Financial resource strain: Not on file  . Food insecurity:    Worry: Not on file    Inability: Not on file  . Transportation needs:    Medical: Not on file    Non-medical: Not on file  Tobacco Use  . Smoking status: Never Smoker  .  Smokeless tobacco: Never Used  Substance and Sexual Activity  . Alcohol use: Yes    Alcohol/week: 21.0 standard drinks    Types: 21 Shots of liquor per week  . Drug use: No  . Sexual activity: Never  Lifestyle  . Physical activity:    Days per week: Not on file    Minutes per session: Not on file  . Stress: Not on file  Relationships  . Social connections:    Talks on phone: Not on file    Gets together: Not on file    Attends religious service: Not on file    Active member of club or organization: Not on file    Attends meetings of clubs or organizations: Not on file    Relationship status: Not on  file  . Intimate partner violence:    Fear of current or ex partner: Not on file    Emotionally abused: Not on file    Physically abused: Not on file    Forced sexual activity: Not on file  Other Topics Concern  . Not on file  Social History Narrative   Sales rep   Review of Systems See HPI    Objective:    Physical Exam Vitals signs reviewed.  Constitutional:      General: He is not in acute distress.    Appearance: Normal appearance.  HENT:     Head: Normocephalic.  Musculoskeletal:     Left knee: He exhibits erythema. Tenderness found. Medial joint line (Slight) tenderness noted. Negative Mcmurray.    Left hand: He exhibits decreased range of motion (Decreased flection of 3rd MCP (70-80 degrees) v. 90 on right). He exhibits no laceration. Slight bony prominence (PIP and DIP).     Left lower leg: 1+ Edema present.     Right Hand: 5th finger has bony prominence, able to stand against resistance, no swelling. Some pitting of nailbed. Skin:    General: Skin is dry.     Left hand: No significant erythema. Slight dry skin (primarily palmar)    Right Hand: 5th DIP: dry skin, swelling, discoloration of lateral aspects of 5th nail    Findings: No lesion.  Neurological:     Mental Status: He is alert and oriented to person, place, and time.  Psychiatric:        Mood and Affect: Mood normal.        Behavior: Behavior normal.        Thought Content: Thought content normal.   Vitals:   07/28/18 1409  BP: 130/60  Pulse: 75  Temp: 98.5 F (36.9 C)  TempSrc: Oral  SpO2: 97%  Weight: 294 lb 8 oz (133.6 kg)  Height: 6' 2.5" (1.892 m)   Dg Knee Complete 4 Views Left  Result Date: 07/28/2018 CLINICAL DATA:  Left knee pain and swelling. History of psoriasis. EXAM: LEFT KNEE - COMPLETE 4+ VIEW COMPARISON:  04/13/2011 FINDINGS: Normal bone mineralization. Minimal spurring along the medial aspect of the femorotibial compartment as well as of the tibial spines. No erosive change, joint  effusion, fracture nor suspicious osseous lesions. Soft tissues are unremarkable. IMPRESSION: Minimal degenerative spurring along the medial aspect of the femorotibial compartment with spurring also noted of tibial spines. No acute osseous abnormality. Electronically Signed   By: Ashley Royalty M.D.   On: 07/28/2018 14:44   Dg Finger Little Right  Result Date: 07/28/2018 CLINICAL DATA:  Right fifth DIP joint swelling EXAM: RIGHT LITTLE FINGER 2+V COMPARISON:  None. FINDINGS: Slight dorsal  soft tissue swelling is seen of the right fifth digit at the level of the DIP joint without underlying osseous abnormality. Minimal degenerative joint space narrowing suggested of the DIP and PIP joints. No appreciable erosive change. IMPRESSION: Slight dorsal soft tissue swelling of the right fifth digit at the level of the DIP joint without underlying osseous abnormality. Findings are nonspecific but can be seen with small ganglion cysts, tenosynovitis, trauma, cellulitis among some considerations though not exclusive. Electronically Signed   By: Ashley Royalty M.D.   On: 07/28/2018 14:48   Dg Finger Middle Left  Result Date: 07/28/2018 CLINICAL DATA:  Left third MCP pain EXAM: LEFT MIDDLE FINGER 2+V COMPARISON:  None. FINDINGS: Normal bone mineralization without erosive change, fracture or joint dislocation. Joint spaces are maintained without significant joint space narrowing/degeneration. No significant soft tissue mass or mineralization. IMPRESSION: Negative. Electronically Signed   By: Ashley Royalty M.D.   On: 07/28/2018 14:45       Assessment & Plan:  Nathaniel Velazquez is a 58 y.o. male Pain in finger of both hands - Plan: DG Finger Middle Left, Ambulatory referral to Rheumatology  Swelling of finger joint of right hand - Plan: DG Finger Little Right, Ambulatory referral to Rheumatology  Pain and swelling of left knee - Plan: DG Knee Complete 4 Views Left, Ambulatory referral to Rheumatology  Psoriasis - Plan:  Ambulatory referral to Rheumatology  Refer to rheumatology as possible psoriatic arthritis, but differential of overuse tendinopathy of hand.  Polysporin to reddened are on finger, but did not appear to have significant cellulitis.  otc ibuprofen temporarily, as long term risks discussed - would like ot decrease NSAID use after meeting with Rheumatology.   No orders of the defined types were placed in this encounter.  Patient Instructions     No concerning findings on x-ray of fingers or knee.  Okay to apply over-the-counter antibiotic ointment such as Polysporin to the reddened area of your right finger but if the redness spreads or any discharge around the nail, be seen right away.    I did refer you to rheumatology as this could be in part due to psoriasis.  However as we discussed the left hand could be due to overuse, and left knee could be in part due to arthritis.  Ibuprofen as needed for now but follow-up with rheumatology to decide if other medications may be more beneficial.  Ultimately I would like to get you off ibuprofen so you are not on that long-term.  Return to the clinic or go to the nearest emergency room if any of your symptoms worsen or new symptoms occur.    If you have lab work done today you will be contacted with your lab results within the next 2 weeks.  If you have not heard from [place] then please contact us. The fastest way to get your results is to register for My Chart.   IF you received an x-ray today, you will receive an invoice from [facility]. Please contact [facility] at [phone] with questions or concerns regarding your invoice.   IF you received labwork today, you will receive an invoice from [place]. Please contact [facility] at [phone] with questions or concerns regarding your invoice.   Our billing staff will not be able to assist you with questions regarding bills from these companies.  You will be contacted with the lab  results as soon as they are available. The fastest way to get your results is to activate  your My Chart account. Instructions are located on the last page of this paperwork. If you have not heard from [place] regarding the results in 2 weeks, please contact this office.       I personally performed the services described in this documentation, which was scribed in my presence. The recorded information has been reviewed and considered for accuracy and completeness, addended by me as needed, and agree with information above.  Signed,   [name], MD Primary [facility] at [place].  08/06/18 10:55 PM

## 2018-07-28 NOTE — Patient Instructions (Addendum)
   No concerning findings on x-ray of fingers or knee.  Okay to apply over-the-counter antibiotic ointment such as Polysporin to the reddened area of your right finger but if the redness spreads or any discharge around the nail, be seen right away.    I did refer you to rheumatology as this could be in part due to psoriasis.  However as we discussed the left hand could be due to overuse, and left knee could be in part due to arthritis.  Ibuprofen as needed for now but follow-up with rheumatology to decide if other medications may be more beneficial.  Ultimately I would like to get you off ibuprofen so you are not on that long-term.  Return to the clinic or go to the nearest emergency room if any of your symptoms worsen or new symptoms occur.    If you have lab work done today you will be contacted with your lab results within the next 2 weeks.  If you have not heard from [place] then please contact us. The fastest way to get your results is to register for My Chart.   IF you received an x-ray today, you will receive an invoice from [facility]. Please contact [facility] at [phone] with questions or concerns regarding your invoice.   IF you received labwork today, you will receive an invoice from [place]. Please contact [facility] at [phone] with questions or concerns regarding your invoice.   Our billing staff will not be able to assist you with questions regarding bills from these companies.  You will be contacted with the lab results as soon as they are available. The fastest way to get your results is to activate your My Chart account. Instructions are located on the last page of this paperwork. If you have not heard from [place] regarding the results in 2 weeks, please contact this office.

## 2018-08-06 ENCOUNTER — Encounter: Payer: Self-pay | Admitting: Family Medicine

## 2018-09-05 NOTE — Progress Notes (Signed)
Office Visit Note  Patient: Nathaniel Velazquez             Date of Birth: 1960-08-03           MRN: [id number]             PCP: [name], MD Referring: [name], MD Visit Date: 09/19/2018 Occupation: Factory sales rep  Subjective:  Pain and swelling in multiple joints.   History of Present Illness: Nathaniel Velazquez is a 58 y.o. male seen in consultation per request of his PCP.  According to patient he was diagnosed with psoriasis about 4 to 5 years ago.  He has been under care of a dermatologist and has been using topical clobetasol.  He states 2 years ago he started having shoulder joint pain and underwent bilateral rotator cuff tear repair.  He has been experiencing pain in his hands and intermittent swelling for the same duration.  He recalls having right knee and left knee joint meniscal tear repair at 19 years and 4 years respectively.  He has been having increased pain in his knee joints especially his left knee joint.  He also complains of some discomfort in his feet for the last 5 years which he relates to neuropathy.  No history of eye involvement.  Patient states that he drinks 3-4 beers every night.  Activities of Daily Living:  Patient reports morning stiffness for several hours.   Patient Denies nocturnal pain.  Difficulty dressing/grooming: Reports Difficulty climbing stairs: Reports Difficulty getting out of chair: Reports Difficulty using hands for taps, buttons, cutlery, and/or writing: Reports  Review of Systems  Constitutional: Negative for fatigue and night sweats.  HENT: Negative for mouth sores, mouth dryness and nose dryness.   Eyes: Negative for redness, itching and dryness.  Respiratory: Negative for shortness of breath, wheezing and difficulty breathing.   Cardiovascular: Negative for chest pain, palpitations, hypertension, irregular heartbeat and swelling in legs/feet.  Gastrointestinal: Negative for abdominal pain, constipation and  diarrhea.  Endocrine: Negative for increased urination.  Genitourinary: Negative for painful urination.  Musculoskeletal: Positive for arthralgias, joint pain, joint swelling and morning stiffness. Negative for myalgias, muscle weakness, muscle tenderness and myalgias.  Skin: Positive for rash. Negative for color change, hair loss, nodules/bumps, skin tightness, ulcers and sensitivity to sunlight.  Allergic/Immunologic: Negative for susceptible to infections.  Neurological: Negative for dizziness, fainting, light-headedness, headaches, memory loss, night sweats and weakness.  Hematological: Negative for bruising/bleeding tendency and swollen glands.  Psychiatric/Behavioral: Negative for depressed mood, confusion and sleep disturbance. The patient is not nervous/anxious.     PMFS History:  Patient Active Problem List   Diagnosis Date Noted  . BMI 38.0-38.9,adult 09/19/2018  . Family history of Wegener's granulomatosis 09/19/2018  . Moderate alcohol consumption 09/19/2018  . Psoriasis 09/19/2018  . Psoriatic arthritis ([place]) 09/19/2018  . Rectal pain 06/05/2015  . Hypertension   . Obesity   . Hyperlipidemia 11/21/2008  . PALPITATIONS 10/30/2008    Past Medical History:  Diagnosis Date  . Arthritis   . GERD (gastroesophageal reflux disease)   . Hypertension   . Obesity    Steroid injection in rt knee    Family History  Problem Relation Age of Onset  . Hypertrophic cardiomyopathy Brother   . Heart disease Mother   . Hypertension Mother   . Heart disease Father   . Hypertension Father   . Obesity Brother   . Heart disease Brother   . Healthy Daughter   .  Spondylitis Son   . Autoimmune disease Brother   . Healthy Daughter   . Colon cancer Neg Hx   . Colon polyps Neg Hx   . Esophageal cancer Neg Hx   . Pancreatic cancer Neg Hx   . Stomach cancer Neg Hx   . Rectal cancer Neg Hx    Past Surgical History:  Procedure Laterality Date  . ANAL FISTULECTOMY  1987  .  APPENDECTOMY    . KNEE SURGERY Bilateral    meniscal tear repairs  . ROTATOR CUFF REPAIR Bilateral 2018  . 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 Influenza, Quadrivalent, Recombinant, Inj, Pf 06/05/2018  . Influenza,inj,Quad PF,6+ Mos 05/17/2013, 04/10/2014, 04/23/2015, 04/06/2017  . Influenza-Unspecified 05/19/2016, 05/18/2018  . Td 07/19/1998  . Tdap 04/10/2014     Objective: Vital Signs: BP 139/82 (BP Location: Right Arm, Patient Position: Sitting, Cuff Size: Large)   Pulse (!) 58   Resp 15   Ht 6\' 2"  (1.88 m)   Wt 299 lb (135.6 kg)   BMI 38.39 kg/m    Physical Exam Vitals signs and nursing note reviewed.  Constitutional:      Appearance: He is well-developed.  HENT:     Head: Normocephalic and atraumatic.  Eyes:     Conjunctiva/sclera: Conjunctivae normal.     Pupils: Pupils are equal, round, and reactive to light.  Neck:     Musculoskeletal: Normal range of motion and neck supple.  Cardiovascular:     Rate and Rhythm: Normal rate and regular rhythm.     Heart sounds: Normal heart sounds.  Pulmonary:     Effort: Pulmonary effort is normal.     Breath sounds: Normal breath sounds.  Abdominal:     General: Bowel sounds are normal.     Palpations: Abdomen is soft.  Skin:    General: Skin is warm and dry.     Capillary Refill: Capillary refill takes less than 2 seconds.     Comments: Psoriasis patches were noted on his extremities and trunk.  Nail pitting and nail dystrophy was noted.  Neurological:     Mental Status: He is alert and oriented to person, place, and time.  Psychiatric:        Behavior: Behavior normal.      Musculoskeletal Exam: C-spine good range of motion.  He had limited range of motion in his lumbar spine without discomfort.  No SI joint tenderness was noted.  Shoulder joints were in good range of motion.  He has some contracture in his right  elbow due to prior injury.  Wrist joints had limited range of motion.  He had tenderness over MCP joints and synovitis over DIP joint as described below.  Nail dystrophy was noted.  Hip joints were in good range of motion.  He has moderate effusion and swelling in his left knee joint.  There was some tenderness across MTP joints with no synovitis.  CDAI Exam: CDAI Score: 7.4  Patient Global Assessment: 8 (mm); Provider Global Assessment: 6 (mm) Swollen: 3 ; Tender: 5  Joint Exam      Right  Left  MCP 1  Swollen Tender     MCP 2      Tender  MCP 3      Tender  DIP 5  Swollen Tender     Knee     Swollen Tender  Investigation: Findings:  06/23/18: ANA-, RF-, CCP-, sed rate 20, uric acid 7.5, CRP 3  Component     Latest Ref Rng & Units 06/23/2018  ANA Titer 1      Negative  RA Latex Turbid.     0.0 - 13.9 IU/mL <56.8  Cyclic Citrullin Peptide Ab     0 - 19 units 8  Sed Rate     0 - 30 mm/hr 20  Uric Acid     3.7 - 8.6 mg/dL 7.5  CRP     0 - 10 mg/L 3   Imaging: Xr Foot 2 Views Left  Result Date: 09/19/2018 PIP and DIP narrowing was noted.  No MTP joint narrowing was noted.  No erosive changes were noted.  No intertarsal joint space narrowing was noted.  Posterior calcaneal spur was noted. Impression: These findings are consistent with osteoarthritis of the foot.  Xr Foot 2 Views Right  Result Date: 09/19/2018 PIP and DIP narrowing was noted.  No MTP joint narrowing was noted.  No erosive changes were noted.  No intertarsal joint space narrowing was noted.  Posterior calcaneal spur was noted. Impression: These findings are consistent with osteoarthritis of the foot.  Xr Hand 2 View Left  Result Date: 09/19/2018 PIP and DIP narrowing was noted.  No MCP, intercarpal radiocarpal narrowing was noted.  No erosive changes were noted. Impression: These findings are consistent with osteoarthritis of the hand.  Xr Hand 2 View Right  Result Date: 09/19/2018 PIP and DIP narrowing was  noted.  No MCP, intercarpal radiocarpal joint space narrowing was noted.  Soft tissue swelling of the right fourth DIP was noted.  No erosive changes were noted. 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   Recent Labs: Lab Results  Component Value Date   WBC 7.2 05/02/2018   HGB 16.0 05/02/2018   PLT 260 04/23/2015   NA 140 06/12/2018   K 3.9 06/12/2018   CL 103 06/12/2018   CO2 23 06/12/2018   GLUCOSE 78 06/12/2018   BUN 10 06/12/2018   CREATININE 0.84 06/12/2018   BILITOT 1.2 06/12/2018   ALKPHOS 48 06/12/2018   AST 13 06/12/2018   ALT 11 06/12/2018   PROT 6.4 06/12/2018   ALBUMIN 4.6 06/12/2018   CALCIUM 9.4 06/12/2018   GFRAA 112 06/12/2018    Speciality Comments: No specialty comments available.  Procedures:  No procedures performed Allergies: Gabapentin   Assessment / Plan:     Visit Diagnoses: Psoriatic arthritis ([place]) -clinical findings are consistent with psoriatic arthritis.  Patient has plaque psoriasis, and DIP synovitis and left knee joint effusion.  Detailed counseling guarding psoriatic arthritis provided.  Different treatment options and their side effects were discussed.  Patient drinks about 3 to 4 glasses of beer every night.  He does not want to give up alcohol.  Methotrexate would not be an option.  I discussed the possible option of Cosentyx.  Indication side effects contraindications were discussed at length.  He is having a lot of discomfort with mobility.  I will give him a prednisone taper starting at 20 mg and taper by 5 mg every 4 days.  06/23/18: ANA-, RF-, CCP-, sed rate 20, uric acid 7.5, CRP 3  Effusion, left knee - left knee and right hand  Pain in both hands -he has pain and discomfort in his bilateral hands with incomplete fist formation.  He also had right fourth DIP synovitis.  X-rays today showed only soft  tissue changes but no erosive changes or joint space narrowing was noted.  Plan: XR  Hand 2 View Right, XR Hand 2 View Left  Pain in both feet - Plan: XR Foot 2 Views Right, XR Foot 2 Views Left.  He has discomfort in the bilateral feet but no synovitis was noted.  There is no history of Achilles tendinitis or plantar fasciitis.  X-rays were unremarkable except for mild osteoarthritis.  Psoriasis - Clobetasol prescribed by his dermatologist.  High risk medication use - Plan: CBC with Differential/Platelet, COMPLETE METABOLIC PANEL WITH GFR, Hepatitis B core antibody, IgM, Hepatitis B surface antigen, Hepatitis C antibody, HIV Antibody (routine testing w rflx), QuantiFERON-TB Gold Plus, Serum protein electrophoresis with reflex, IgG, IgA, IgM  Moderate alcohol consumption-3-4 beers nightly  BMI-38.39-weight loss diet and exercise was discussed.  Association of psoriatic arthritis with heart disease was also emphasized.  Essential hypertension  History of gastroesophageal reflux (GERD)  History of hyperlipidemia  Family history of ankylosing spondylitis - son is on Humira  Family history of Wegener's granulomatosis - Brother died from [employer] granulomatosis   Orders: Orders Placed This Encounter  Procedures  . XR Hand 2 View Right  . XR Hand 2 View Left  . XR Foot 2 Views Right  . XR Foot 2 Views Left  . CBC with Differential/Platelet  . COMPLETE METABOLIC PANEL WITH GFR  . Hepatitis B core antibody, IgM  . Hepatitis B surface antigen  . Hepatitis C antibody  . HIV Antibody (routine testing w rflx)  . QuantiFERON-TB Gold Plus  . Serum protein electrophoresis with reflex  . IgG, IgA, IgM   Meds ordered this encounter  Medications  . predniSONE (DELTASONE) 5 MG tablet    Sig: Take 4 tablets (20 mg total) by mouth daily for 4 days, THEN 3 tablets (15 mg total) daily for 4 days, THEN 2 tablets (10 mg total) daily for 4 days, THEN 1 tablet (5 mg total) daily for 4 days, THEN 0.5 tablets (2.5 mg total) daily for 4 days.    Dispense:  42 tablet    Refill:  0     Face-to-face time spent with patient was 60 minutes. Greater than 50% of time was spent in counseling and coordination of care.  Follow-Up Instructions: Return for Psoriatic arthritis.   Bo Merino, MD  Note - This record has been created using Editor, commissioning.  Chart creation errors have been sought, but may not always  have been located. Such creation errors do not reflect on  the standard of medical care.

## 2018-09-19 ENCOUNTER — Ambulatory Visit (INDEPENDENT_AMBULATORY_CARE_PROVIDER_SITE_OTHER): Payer: Self-pay

## 2018-09-19 ENCOUNTER — Ambulatory Visit (INDEPENDENT_AMBULATORY_CARE_PROVIDER_SITE_OTHER): Payer: BC Managed Care – PPO

## 2018-09-19 ENCOUNTER — Encounter: Payer: Self-pay | Admitting: Rheumatology

## 2018-09-19 ENCOUNTER — Telehealth: Payer: Self-pay | Admitting: Pharmacist

## 2018-09-19 ENCOUNTER — Ambulatory Visit: Payer: BC Managed Care – PPO | Admitting: Rheumatology

## 2018-09-19 VITALS — BP 139/82 | HR 58 | Resp 15 | Ht 74.0 in | Wt 299.0 lb

## 2018-09-19 DIAGNOSIS — Z8719 Personal history of other diseases of the digestive system: Secondary | ICD-10-CM

## 2018-09-19 DIAGNOSIS — L4050 Arthropathic psoriasis, unspecified: Secondary | ICD-10-CM

## 2018-09-19 DIAGNOSIS — M79641 Pain in right hand: Secondary | ICD-10-CM

## 2018-09-19 DIAGNOSIS — M79642 Pain in left hand: Secondary | ICD-10-CM

## 2018-09-19 DIAGNOSIS — Z789 Other specified health status: Secondary | ICD-10-CM

## 2018-09-19 DIAGNOSIS — Z832 Family history of diseases of the blood and blood-forming organs and certain disorders involving the immune mechanism: Secondary | ICD-10-CM

## 2018-09-19 DIAGNOSIS — M79672 Pain in left foot: Secondary | ICD-10-CM | POA: Diagnosis not present

## 2018-09-19 DIAGNOSIS — M79671 Pain in right foot: Secondary | ICD-10-CM

## 2018-09-19 DIAGNOSIS — Z8249 Family history of ischemic heart disease and other diseases of the circulatory system: Secondary | ICD-10-CM | POA: Insufficient documentation

## 2018-09-19 DIAGNOSIS — I10 Essential (primary) hypertension: Secondary | ICD-10-CM

## 2018-09-19 DIAGNOSIS — M25462 Effusion, left knee: Secondary | ICD-10-CM

## 2018-09-19 DIAGNOSIS — Z8639 Personal history of other endocrine, nutritional and metabolic disease: Secondary | ICD-10-CM

## 2018-09-19 DIAGNOSIS — Z6833 Body mass index (BMI) 33.0-33.9, adult: Secondary | ICD-10-CM | POA: Insufficient documentation

## 2018-09-19 DIAGNOSIS — L409 Psoriasis, unspecified: Secondary | ICD-10-CM

## 2018-09-19 DIAGNOSIS — L4059 Other psoriatic arthropathy: Secondary | ICD-10-CM | POA: Insufficient documentation

## 2018-09-19 DIAGNOSIS — Z8269 Family history of other diseases of the musculoskeletal system and connective tissue: Secondary | ICD-10-CM

## 2018-09-19 DIAGNOSIS — Z6838 Body mass index (BMI) 38.0-38.9, adult: Secondary | ICD-10-CM

## 2018-09-19 DIAGNOSIS — Z79899 Other long term (current) drug therapy: Secondary | ICD-10-CM

## 2018-09-19 MED ORDER — PREDNISONE 5 MG PO TABS: ORAL_TABLET | ORAL | 0 refills | Status: AC

## 2018-09-19 NOTE — Patient Instructions (Addendum)
Standing Labs We placed an order today for your standing lab work.    Please come back and get your standing labs in 1 month and then 3 months.  We have open lab Monday through Friday from 8:30-11:30 AM and 1:30-4:00 PM  at the office of Dr. Bo Merino.   You may experience shorter wait times on Monday and Friday afternoons. The office is located at [address], [place], [place],  [address] No appointment is necessary.   Labs are drawn by [facility].  You may receive a bill from [facility] for your lab work.  If you wish to have your labs drawn at another location, please call the office 24 hours in advance to send orders.  If you have any questions regarding directions or hours of operation,  please call [phone].   Just as a reminder please drink plenty of water prior to coming for your lab work. Thanks!  Vaccines You are taking a medication(s) that can suppress your immune system.  The following immunizations are recommended: . Flu annually . Pneumonia (Pneumovax 23 and Prevnar 13 spaced at least 1 year apart) . Shingrix  Please check with your PCP to make sure you are up to date.   Secukinumab injection What is this medicine? SECUKINUMAB (sek ue KIN ue mab) is used to treat psoriasis. It is also used to treat psoriatic arthritis and ankylosing spondylitis. This medicine may be used for other purposes; ask your health care provider or pharmacist if you have questions. COMMON BRAND NAME(S): Cosentyx What should I tell my health care provider before I take this medicine? They need to know if you have any of these conditions: -Crohn's disease, ulcerative colitis, or other inflammatory bowel disease -infection or history of infection -other conditions affecting the immune system -recently received or are scheduled to receive a vaccine -tuberculosis, a positive skin test for tuberculosis, or have recently been in close contact with someone who has tuberculosis -an  unusual or allergic reaction to secukinumab, other medicines, latex, rubber, foods, dyes, or preservatives -pregnant or trying to get pregnant -breast-feeding How should I use this medicine? This medicine is for injection under the skin. It may be administered by a healthcare professional in a hospital or clinic setting or at home. If you get this medicine at home, you will be taught how to prepare and give this medicine. Use exactly as directed. Take your medicine at regular intervals. Do not take your medicine more often than directed. It is important that you put your used needles and syringes in a special sharps container. Do not put them in a trash can. If you do not have a sharps container, call your pharmacist or healthcare provider to get one. A special MedGuide will be given to you by the pharmacist with each prescription and refill. Be sure to read this information carefully each time. Talk to your pediatrician regarding the use of this medicine in children. Special care may be needed. Overdosage: If you think you have taken too much of this medicine contact a poison control center or emergency room at once. NOTE: This medicine is only for you. Do not share this medicine with others. What if I miss a dose? It is important not to miss your dose. Call your doctor of health care professional if you are unable to keep an appointment. If you give yourself the medicine and you miss a dose, take it as soon as you can. If it is almost time for your next  dose, take only that dose. Do not take double or extra doses. What may interact with this medicine? Do not take this medicine with any of the following medications: -live virus vaccines This medicine may also interact with the following medications: -cyclosporine -inactivated vaccines -warfarin This list may not describe all possible interactions. Give your health care provider a list of all the medicines, herbs, non-prescription drugs, or  dietary supplements you use. Also tell them if you smoke, drink alcohol, or use illegal drugs. Some items may interact with your medicine. What should I watch for while using this medicine? Tell your doctor or healthcare professional if your symptoms do not start to get better or if they get worse. You will be tested for tuberculosis (TB) before you start this medicine. If your doctor prescribes any medicine for TB, you should start taking the TB medicine before starting this medicine. Make sure to finish the full course of TB medicine. Call your doctor or healthcare professional for advice if you get a fever, chills or sore throat, or other symptoms of a cold or flu. Do not treat yourself. This drug decreases your body's ability to fight infections. Try to avoid being around people who are sick. This medicine can decrease the response to a vaccine. If you need to get vaccinated, tell your healthcare professional if you have received this medicine within the last 6 months. Extra booster doses may be needed. Talk to your doctor to see if a different vaccination schedule is needed. What side effects may I notice from receiving this medicine? Side effects that you should report to your doctor or health care professional as soon as possible: -allergic reactions like skin rash, itching or hives, swelling of the face, lips, or tongue -signs and symptoms of infection like fever or chills; cough; sore throat; pain or trouble passing urine Side effects that usually do not require medical attention (report to your doctor or health care professional if they continue or are bothersome): -diarrhea This list may not describe all possible side effects. Call your doctor for medical advice about side effects. You may report side effects to FDA at 1-800-FDA-1088. Where should I keep my medicine? Keep out of the reach of children. Store the prefilled syringe or injection pen in a refrigerator between 2 to 8 degrees C  (36 to 46 degrees F). Keep the syringe or the pen in the original carton until ready for use. Protect from light. Do not freeze. Do not shake. Prior to use, remove the syringe or pen from the refrigerator and use within 1 hour. Throw away any unused medicine after the expiration date on the label. NOTE: This sheet is a summary. It may not cover all possible information. If you have questions about this medicine, talk to your doctor, pharmacist, or health care provider.  2019 Elsevier/Gold Standard (2015-08-07 11:48:31)

## 2018-09-19 NOTE — Telephone Encounter (Signed)
Please start BIV for Cosentyx.  Patient has psoriatic arthritis and plaque psoriasis. He is naive to therapy.  He currently drinks 3-4 beers a night and has history of fatty liver disease so not a good candidate for MTX.   New Start Visit/prescription pending lab results and insurance approval.

## 2018-09-19 NOTE — Telephone Encounter (Signed)
Received a Prior Authorization request from [employer] for [employer]. Authorization has been submitted to patient's insurance via Cover My Meds. Will update once we receive a response.

## 2018-09-19 NOTE — Progress Notes (Signed)
Pharmacy Note  Subjective:  Patient presents today to the [facility] to see [name].  Patient was seen by the pharmacist for counseling on Cosentyx for psoriatic arthritis and plaque psoriasis.  He is naive to therapy.  He drinks alcohol regularly and also has a history of fatty liver disease making him a poor candidate for methotrexate.   Objective:  CBC    Component Value Date/Time   WBC 7.2 05/02/2018 1418   WBC 7.1 04/23/2015 1458   RBC 5.01 05/02/2018 1418   RBC 5.16 04/23/2015 1458   HGB 16.0 05/02/2018 1418   HGB 16.2 04/23/2015 1458   HCT 47.3 05/02/2018 1418   HCT 46.0 04/23/2015 1458   PLT 260 04/23/2015 1458   MCV 94.4 05/02/2018 1418   MCH 31.9 (A) 05/02/2018 1418   MCH 31.4 04/23/2015 1458   MCHC 33.7 05/02/2018 1418   MCHC 35.2 04/23/2015 1458   RDW 13.0 04/23/2015 1458    CMP     Component Value Date/Time   NA 140 06/12/2018 1515   K 3.9 06/12/2018 1515   CL 103 06/12/2018 1515   CO2 23 06/12/2018 1515   GLUCOSE 78 06/12/2018 1515   GLUCOSE 99 02/25/2016 0758   BUN 10 06/12/2018 1515   CREATININE 0.84 06/12/2018 1515   CREATININE 0.93 02/25/2016 0758   CALCIUM 9.4 06/12/2018 1515   PROT 6.4 06/12/2018 1515   ALBUMIN 4.6 06/12/2018 1515   AST 13 06/12/2018 1515   ALT 11 06/12/2018 1515   ALKPHOS 48 06/12/2018 1515   BILITOT 1.2 06/12/2018 1515   GFRNONAA 97 06/12/2018 1515   GFRNONAA >89 02/25/2016 0758   GFRAA 112 06/12/2018 1515   GFRAA >89 02/25/2016 0758    Baseline Immunosuppressant Therapy Labs  TB gold: pending 09/19/2018 Hepatitis panel: pending 09/19/2018 HIV: pending 09/19/2018 Immunoglobulins: pending 09/19/2018 SPEP: pending 09/19/2018  Chest x-ray: no active cardiopulmonary disease 05/02/18  Does patient have a history of inflammatory bowel disease? No  Assessment/Plan:   Patient is not willing to stop alcohol intake at this time and also has history of fatty liver disease so he is not a good candidate for  methotrexate.  He is hesitant to start on an injectable medication.  He is familiar with Humira as his son takes it.  Had detailed discussion of risk/benefits of Humira and Cosentyx. Concerned about using Humira monotherapy without methotrexate due to risk of anti-drug antibodies. Patient wants to proceed with Cosentyx at this time.  Counseled patient that Cosentyx is a IL-17 inhibitor that works to reduce pain and inflammation associated with arthritis.  Counseled patient on purpose, proper use, and adverse effects of Cosentyx.  Reviewed the most common adverse effects of infection, inflammatory bowel disease, and allergic reaction.  Counseled patient that Cosentyx should be held prior to scheduled surgery.  Counseled patient to avoid live vaccines while on Cosentyx.  Recommend annual influenza, Pneumovax 23, Prevnar 13, and Shingrix as indicated.   Reviewed the importance of regular labs while on Cosentyx. Standing orders placed. Provided patient with medication education material and answered all questions.  Patient consented to Cosentyx.  Will upload consent into patient's chart.  Will apply for Cosentyx through patient's insurance.  Reviewed storage information for Cosentyx.  Advised initial injection must be administered in office.  Patient voiced understanding.    Patient dose will be for plaque psoriasis +/- psoriatic arthritis 300 mg every 7 days for 5 weeks then 300 mg every 28 days.  Prescription pending labs and insurance  approval.  All questions encouraged and answered.  Instructed patient to call with any further questions or concerns.  Mariella Saa, PharmD, [facility] Clinical Pharmacist  09/19/2018 12:15 PM

## 2018-09-20 ENCOUNTER — Telehealth: Payer: Self-pay | Admitting: Rheumatology

## 2018-09-20 NOTE — Progress Notes (Signed)
Patient's LFTs are increased.  He does drink alcohol on a regular basis.  Please advise decreasing alcohol consumption.  He should avoid NSAIDs.

## 2018-09-20 NOTE — Telephone Encounter (Signed)
Received a fax from [employer] regarding a prior authorization for [place]. Authorization has been APPROVED from 09/19/2018 to 09/19/2019.   Will send document to scan center.  Authorization # [id number]  Per plan, patient must fill with [facility]. Patient has commercial plan- copay card eligible

## 2018-09-20 NOTE — Telephone Encounter (Signed)
Patient notified of Cosentyx approval.  If you have his labs are still pending.  If labs are normal please schedule new start visit.

## 2018-09-20 NOTE — Telephone Encounter (Signed)
Returned patient call.  He wanted to know which would be better to take for headache Advil or Tylenol and if it would interact with prednisone.  Informed patient that neither medication will interact with prednisone.  Informed patient that Tylenol can increase liver enzymes and they are currently elevated but that is typically associated with higher doses and long-term use.  Informed patient that Advil can also affect the liver, kidney, increased risk of GI bleed but again more common with taking high doses long-term.  Informed patient is safe to use either Advil or Tylenol at the lowest dose possible for the least amount of time.  Patient verbalized understanding and showed appreciation for returning his call.  Updated patient about prior authorization for Cosentyx and that it was approved.  We are still waiting for a couple of his labs to result.  Once they return if they are normal we can go ahead and schedule new start visit.  Patient verbalized understanding.  All questions encouraged and answered.  Instructed patient to call with any further questions or concerns.  Mariella Saa, PharmD, [facility] Clinical Pharmacist  09/20/2018 4:07 PM

## 2018-09-20 NOTE — Telephone Encounter (Signed)
Patient left a voicemail stating he is currently taking Prednisone and is checking if it is okay to take Advil or Tylenol for a headache.  Patient requested a return call.

## 2018-09-21 LAB — COMPLETE METABOLIC PANEL WITH GFR
AG Ratio: 1.6 (calc) (ref 1.0–2.5)
ALT: 98 U/L — ABNORMAL HIGH (ref 9–46)
AST: 52 U/L — AB (ref 10–35)
Albumin: 4.4 g/dL (ref 3.6–5.1)
Alkaline phosphatase (APISO): 67 U/L (ref 35–144)
BUN: 17 mg/dL (ref 7–25)
CO2: 24 mmol/L (ref 20–32)
Calcium: 10.1 mg/dL (ref 8.6–10.3)
Chloride: 103 mmol/L (ref 98–110)
Creat: 0.98 mg/dL (ref 0.70–1.33)
GFR, Est African American: 98 mL/min/{1.73_m2} (ref >=60)
GFR, Est Non African American: 85 mL/min/{1.73_m2} (ref >=60)
GLOBULIN: 2.8 g/dL (ref 1.9–3.7)
Glucose, Bld: 101 mg/dL — ABNORMAL HIGH (ref 65–99)
Potassium: 4.8 mmol/L (ref 3.5–5.3)
Sodium: 137 mmol/L (ref 135–146)
Total Bilirubin: 0.6 mg/dL (ref 0.2–1.2)
Total Protein: 7.2 g/dL (ref 6.1–8.1)

## 2018-09-21 LAB — HEPATITIS B CORE ANTIBODY, IGM: Hep B C IgM: NONREACTIVE

## 2018-09-21 LAB — IGG, IGA, IGM
IGG (IMMUNOGLOBIN G), SERUM: 1248 mg/dL (ref 600–1640)
IgM, Serum: 46 mg/dL — ABNORMAL LOW (ref 50–300)
Immunoglobulin A: <5 mg/dL — ABNORMAL LOW (ref 47–310)

## 2018-09-21 LAB — PROTEIN ELECTROPHORESIS, SERUM, WITH REFLEX
Albumin ELP: 4.2 g/dL (ref 3.8–4.8)
Alpha 1: 0.3 g/dL (ref 0.2–0.3)
Alpha 2: 0.8 g/dL (ref 0.5–0.9)
Beta 2: 0.3 g/dL (ref 0.2–0.5)
Beta Globulin: 0.5 g/dL (ref 0.4–0.6)
GAMMA GLOBULIN: 1.2 g/dL (ref 0.8–1.7)
Total Protein: 7.4 g/dL (ref 6.1–8.1)

## 2018-09-21 LAB — CBC WITH DIFFERENTIAL/PLATELET
Absolute Monocytes: 918 cells/uL (ref 200–950)
Basophils Absolute: 83 cells/uL (ref 0–200)
Basophils Relative: 1.2 %
Eosinophils Absolute: 511 cells/uL — ABNORMAL HIGH (ref 15–500)
Eosinophils Relative: 7.4 %
HCT: 43.7 % (ref 38.5–50.0)
Hemoglobin: 15.8 g/dL (ref 13.2–17.1)
Lymphs Abs: 2077 cells/uL (ref 850–3900)
MCH: 33.3 pg — ABNORMAL HIGH (ref 27.0–33.0)
MCHC: 36.2 g/dL — ABNORMAL HIGH (ref 32.0–36.0)
MCV: 92 fL (ref 80.0–100.0)
MPV: 10.4 fL (ref 7.5–12.5)
Monocytes Relative: 13.3 %
Neutro Abs: 3312 cells/uL (ref 1500–7800)
Neutrophils Relative %: 48 %
Platelets: 205 10*3/uL (ref 140–400)
RBC: 4.75 10*6/uL (ref 4.20–5.80)
RDW: 12.9 % (ref 11.0–15.0)
Total Lymphocyte: 30.1 %
WBC: 6.9 10*3/uL (ref 3.8–10.8)

## 2018-09-21 LAB — HEPATITIS C ANTIBODY
Hepatitis C Ab: NONREACTIVE
SIGNAL TO CUT-OFF: 0.1 (ref <1.00)

## 2018-09-21 LAB — HEPATITIS B SURFACE ANTIGEN: HEP B S AG: NONREACTIVE

## 2018-09-21 LAB — QUANTIFERON-TB GOLD PLUS
Mitogen-NIL: >10 IU/mL
NIL: 0.02 IU/mL
QuantiFERON-TB Gold Plus: NEGATIVE
TB1-NIL: 0 IU/mL
TB2-NIL: 0 IU/mL

## 2018-09-21 LAB — HIV ANTIBODY (ROUTINE TESTING W REFLEX): HIV 1&2 Ab, 4th Generation: NONREACTIVE

## 2018-09-22 ENCOUNTER — Telehealth: Payer: Self-pay | Admitting: *Deleted

## 2018-09-22 DIAGNOSIS — D809 Immunodeficiency with predominantly antibody defects, unspecified: Secondary | ICD-10-CM

## 2018-09-22 NOTE — Telephone Encounter (Signed)
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  I also called [name] who suggested evaluation for immunoglobulin deficiencies.  He did not think that starting Cosentyx will be a contraindication but he may need prophylactic ant ibiotics in case he starts Cosentyx.  Please refer patient to the allergist.

## 2018-09-22 NOTE — Progress Notes (Signed)
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  I also called [name] who suggested evaluation for immunoglobulin deficiencies.  He did not think that starting Cosentyx will be a contraindication but he may need prophylactic antibiotics in case he starts Cosentyx.  Please refer patient to the allergist.

## 2018-09-25 ENCOUNTER — Telehealth: Payer: Self-pay | Admitting: Pharmacist

## 2018-09-25 NOTE — Telephone Encounter (Signed)
Patient called to inquire about allergist referral.  He received a call from the allergist office and he thought they had the wrong patient.  Patient states he spoke with Dr.Deveshwar on Friday about abnormal immunoglobulins.  Informed patient that he is correct and we did replace a referral to an allergist in order to investigate abnormal immunoglobulins further.  Patient verbalized understanding.  Patient states he will call their office to schedule an appointment.  He is concerned about covid-19 virus and starting Cosentyx.  Informed patient that the benefits of the medication outweigh the risk.  Encourage patient to practice good hand hygiene and stay away from others who are sick.  Patient verbalized understanding.  Patient reports he is having a good response with prednisone.  All questions encouraged and answered.  Instructed patient to call with any further questions or concerns.  Mariella Saa, PharmD, [facility] Clinical Pharmacist  09/25/2018 3:39 PM

## 2018-10-31 ENCOUNTER — Ambulatory Visit: Payer: BC Managed Care – PPO | Admitting: Rheumatology

## 2018-11-17 NOTE — Progress Notes (Signed)
Virtual Visit via Video Note  I connected with Nathaniel Velazquez on 11/17/18 at  1:00 PM EDT by a video enabled telemedicine application and verified that I am speaking with the correct person using two identifiers.  Location: Patient: Home Provider: Clinic   This service was conducted via virtual visit.  Both audio and visual tools were used.  The patient was located at home. I was located in my office.  Consent was obtained prior to the virtual visit and is aware of possible charges through their insurance for this visit.  The patient is an established patient.  [name], MD conducted the virtual visit and [name], PA-C acted as scribe during the service.  Office staff helped with scheduling follow up visits after the service was conducted.   I discussed the limitations of evaluation and management by telemedicine and the availability of in person appointments. The patient expressed understanding and agreed to proceed.  CC: Left knee joint pain  History of Present Illness: Patient is a 58 year old male with a past medical history of psoriatic arthritis and osteoarthritis. 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  He wil require not had follow up labs drawn yet.   He noticed improvement while on prednisone taper.  He has chronic left knee joint pain and swelling.  He has intermittent pain in both hands.  He continues to have joint swelling of bilateral 5th DIP joints.  He denies SI joint pain, achilles tendonitis, or plantar fasciitis.  He has scattered patches of psoriasis, and he uses clobetasol topically.  He has been taking ibuprofen very sparingly for pain relief.  Review of Systems  Constitutional: Negative for fever and malaise/fatigue.  Eyes: Negative for photophobia, pain, discharge and redness.  Respiratory: Negative for cough, shortness of breath and wheezing.   Cardiovascular: Negative for chest pain and palpitations.   Gastrointestinal: Negative for blood in stool, constipation and diarrhea.  Genitourinary: Negative for dysuria.  Musculoskeletal: Positive for joint pain. Negative for back pain, myalgias and neck pain.  Skin: Negative for rash.  Neurological: Negative for dizziness and headaches.  Psychiatric/Behavioral: Negative for depression. The patient is not nervous/anxious and does not have insomnia.       Observations/Objective: Physical Exam  Constitutional: He is oriented to person, place, and time and well-developed, well-nourished, and in no distress.  HENT:  Head: Normocephalic and atraumatic.  Eyes: Conjunctivae are normal.  Pulmonary/Chest: Effort normal.  Neurological: He is alert and oriented to person, place, and time.  Psychiatric: Mood, memory, affect and judgment normal.   Patient reports morning stiffness for 3-4 hours.   Patient denies nocturnal pain.  Difficulty dressing/grooming: Denies Difficulty climbing stairs: Reports Difficulty getting out of chair: Reports Difficulty using hands for taps, buttons, cutlery, and/or writing: Reports  Bilateral 5th DIP joint and left 3rd PIP joint synovitis.   Assessment and Plan: Psoriatic arthritis ([place]) -He continues to have joint inflammation and joint pain.  He has chronic left knee joint pain and recurrent effusions.  He has synovitis of bilateral 5th DIP joints and left 3rd PIP joint.  He has no SI joint pain, achilles tendonitis, or plantar fasciitis.  He had his initial evaluation for IgA deficiency with [name], and he will be undergoing additional lab work.  He was cleared to start on Cosentyx but prophylactic antibiotics were suggested prior to initiation.  His insurance has approved Cosentyx.  We will hold off on starting him  on Cosentyx until he has completed the entire workup by his immunologist.  We discussed starting him on [place].  Indications, contraindications, and potential side effects of Otezla were discussed.   All questions were addressed.  He will come by the office to obtain the informational handout and complete the consent form.  We will apply for [name] through his insurance and provide a sample starter pack for him.  He was advised to notify us if he cannot tolerate Otezla.  He will follow up on 12/05/18.   Counseled patient that [name] is a PDE 4 inhibitor that works to treat psoriasis and the joint pain and tenderness of psoriatic arthritis.  Counseled patient on purpose, proper use, and adverse effects of Otezla.  Reviewed the most common adverse effects of weight loss, depression, nausea/diarrhea/vomiting, headaches, and nasal congestion.  Advised patient to notify office of any serious changes in mood and/or thoughts of suicide.  Provided patient with medication education material and answered all questions.  Patient consented to [place].    Patient dose will be Otezla starter titration pack and then 30 mg twice daily.  Prescription pending insurance approval and once approved patient may pick up sample for starter pack from our office.    High risk medication use: We discussed starting him on [place].  We will apply for [name] through his insurance.  We can provide a sample pack of Ote[place]la for the patient to start on.  MTX-not a good option due to the patient drinking 3-4 beers per night. He was diagnosed with IgA deficiency, and he is undergoing an extensive workup by [name].  We will hold off on prescribing Cosentyx until the workup is complete and he is cleared to start.  Immunoglobulin deficiency- w/u pending.  Effusion, left knee - He has chronic left knee joint pain and joint swelling.  He has difficulty climbing steps and getting up from a chair.   Psoriasis: He has scattered patches of psoriasis.  He uses Clobetasol topically PRN prescribed by his dermatologist.  Primary osteoarthritis of both hands: He has PIP and DIP synovial thickening consistent with osteoarthritis of both hands.   He has synovitis of bilateral 5th DIP joints.    Primary osteoarthritis of both feet: He has no joint pain or joint swelling at this time.  He has symptoms of neuropathy in both feet that causes discomfort.  He has no achilles tendonitis or plantar fasciitis.   Family history of ankylosing spondylitis - His son is prescribed Humira.  Family history of Wegener's granulomatosis - Brother died from Wegener's granulomatosis   Follow Up Instructions: He will follow up on 12/05/18. We will apply for [facility].  He has IgA deficiency.   We will provide a sample starter back.  He will come by the office to fill out the consent form and obtain the informational handout.     I discussed the assessment and treatment plan with the patient. The patient was provided an opportunity to ask questions and all were answered. The patient agreed with the plan and demonstrated an understanding of the instructions.   The patient was advised to call back or seek an in-person evaluation if the symptoms worsen or if the condition fails to improve as anticipated.  I provided 25 minutes of non-face-to-face time during this encounter.  Bo Merino, MD   Scribed by-  Ofilia Neas, PA-C

## 2018-11-23 ENCOUNTER — Telehealth (INDEPENDENT_AMBULATORY_CARE_PROVIDER_SITE_OTHER): Payer: BC Managed Care – PPO | Admitting: Rheumatology

## 2018-11-23 ENCOUNTER — Encounter: Payer: Self-pay | Admitting: Rheumatology

## 2018-11-23 ENCOUNTER — Other Ambulatory Visit: Payer: Self-pay

## 2018-11-23 DIAGNOSIS — Z8719 Personal history of other diseases of the digestive system: Secondary | ICD-10-CM

## 2018-11-23 DIAGNOSIS — M19072 Primary osteoarthritis, left ankle and foot: Secondary | ICD-10-CM

## 2018-11-23 DIAGNOSIS — Z79899 Other long term (current) drug therapy: Secondary | ICD-10-CM | POA: Diagnosis not present

## 2018-11-23 DIAGNOSIS — L4050 Arthropathic psoriasis, unspecified: Secondary | ICD-10-CM

## 2018-11-23 DIAGNOSIS — M25462 Effusion, left knee: Secondary | ICD-10-CM | POA: Diagnosis not present

## 2018-11-23 DIAGNOSIS — Z789 Other specified health status: Secondary | ICD-10-CM

## 2018-11-23 DIAGNOSIS — D809 Immunodeficiency with predominantly antibody defects, unspecified: Secondary | ICD-10-CM

## 2018-11-23 DIAGNOSIS — M19041 Primary osteoarthritis, right hand: Secondary | ICD-10-CM | POA: Diagnosis not present

## 2018-11-23 DIAGNOSIS — Z8269 Family history of other diseases of the musculoskeletal system and connective tissue: Secondary | ICD-10-CM

## 2018-11-23 DIAGNOSIS — Z8639 Personal history of other endocrine, nutritional and metabolic disease: Secondary | ICD-10-CM

## 2018-11-23 DIAGNOSIS — Z832 Family history of diseases of the blood and blood-forming organs and certain disorders involving the immune mechanism: Secondary | ICD-10-CM

## 2018-11-23 DIAGNOSIS — L409 Psoriasis, unspecified: Secondary | ICD-10-CM

## 2018-11-23 DIAGNOSIS — M19071 Primary osteoarthritis, right ankle and foot: Secondary | ICD-10-CM

## 2018-11-23 DIAGNOSIS — I10 Essential (primary) hypertension: Secondary | ICD-10-CM

## 2018-11-23 DIAGNOSIS — M19042 Primary osteoarthritis, left hand: Secondary | ICD-10-CM

## 2018-11-28 ENCOUNTER — Telehealth: Payer: Self-pay | Admitting: *Deleted

## 2018-11-28 MED ORDER — APREMILAST 30 MG PO TABS: 30.0000 mg | ORAL_TABLET | Freq: Two times a day (BID) | ORAL | 2 refills | Status: DC

## 2018-11-28 NOTE — Progress Notes (Signed)
Office Visit Note  Patient: Nathaniel Velazquez             Date of Birth: [birth year]           MRN: [id number]             PCP: [name], MD Referring: [name], MD Visit Date: 12/05/2018 Occupation: @GUAROCC @  Subjective:  Left knee joint pain and swelling   History of Present Illness: RASHAD AULD is a 58 y.o. male with history of psoriatic arthritis and osteoarthritis. He was started on [place] 14 days ago.  He is taking Otezla 30 mg BID.  He has not noticed any improvement since starting on Otezla.   He has been tolerating it well with very mild nausea.  He is having pain and swelling in the left knee joint.  He states he was performing yard work yesterday, and he was in severe pain last night.  He is having to take Advil 3-4 times per day.  He has pain in both hands.  He has no SI joint pain, achilles tendonitis, or plantar fasciitis.    Activities of Daily Living:  Patient reports morning stiffness for 4  hours.   Patient Reports nocturnal pain.  Difficulty dressing/grooming: Denies Difficulty climbing stairs: Reports Difficulty getting out of chair: Reports Difficulty using hands for taps, buttons, cutlery, and/or writing: Reports  Review of Systems  Constitutional: Positive for fatigue. Negative for night sweats.  HENT: Negative for mouth sores, mouth dryness and nose dryness.   Eyes: Negative for redness and dryness.  Respiratory: Negative for cough, hemoptysis, shortness of breath and difficulty breathing.   Cardiovascular: Negative for chest pain, palpitations, hypertension, irregular heartbeat and swelling in legs/feet.  Gastrointestinal: Positive for nausea. Negative for blood in stool, constipation and diarrhea.  Endocrine: Negative for increased urination.  Genitourinary: Negative for painful urination.  Musculoskeletal: Positive for arthralgias, joint pain, joint swelling and morning stiffness. Negative for myalgias, muscle weakness, muscle  tenderness and myalgias.  Skin: Positive for rash. Negative for color change, hair loss, nodules/bumps, skin tightness, ulcers and sensitivity to sunlight.  Allergic/Immunologic: Negative for susceptible to infections.  Neurological: Negative for dizziness, fainting, memory loss, night sweats and weakness.  Hematological: Negative for swollen glands.  Psychiatric/Behavioral: Negative for depressed mood and sleep disturbance. The patient is not nervous/anxious.     PMFS History:  Patient Active Problem List   Diagnosis Date Noted  . 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 BMI 38.0-38.9,adult 09/19/2018  . Family history of Wegener's granulomatosis 09/19/2018  . Moderate alcohol consumption 09/19/2018  . Psoriasis 09/19/2018  . Psoriatic arthritis ([place]) 09/19/2018  . Rectal pain 06/05/2015  . Hypertension   . Obesity   . Hyperlipidemia 11/21/2008  . PALPITATIONS 10/30/2008    Past Medical History:  Diagnosis Date  . Arthritis   . GERD (gastroesophageal reflux disease)   . Hypertension   . Obesity    Steroid injection in rt knee    Family History  Problem Relation Age of Onset  . Hypertrophic cardiomyopathy Brother   . Heart disease Mother   . Hypertension Mother   . Heart disease Father   . Hypertension Father   . Obesity Brother   . Heart disease Brother   . Healthy Daughter   . Spondylitis Son   . Autoimmune disease Brother   . Healthy Daughter   . Colon cancer Neg Hx   . Colon polyps Neg Hx   . Esophageal cancer Neg Hx   .  Pancreatic cancer Neg Hx   . Stomach cancer Neg Hx   . Rectal cancer Neg Hx    Past Surgical History:  Procedure Laterality Date  . ANAL FISTULECTOMY  1987  . APPENDECTOMY    . KNEE SURGERY Bilateral    meniscal tear repairs  . ROTATOR CUFF REPAIR Bilateral 2018  . 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 Influenza, Quadrivalent,  Recombinant, Inj, Pf 06/05/2018  . Influenza,inj,Quad PF,6+ Mos 05/17/2013, 04/10/2014, 04/23/2015, 04/06/2017  . Influenza-Unspecified 05/19/2016, 05/18/2018  . Td 07/19/1998  . Tdap 04/10/2014     Objective: Vital Signs: BP (!) 150/82 (BP Location: Left Arm, Patient Position: Sitting, Cuff Size: Normal)   Pulse 73   Resp 15   Ht 6\' 3"  (1.905 m)   Wt 292 lb (132.5 kg)   BMI 36.50 kg/m    Physical Exam Vitals signs and nursing note reviewed.  Constitutional:      Appearance: He is well-developed.  HENT:     Head: Normocephalic and atraumatic.  Eyes:     Conjunctiva/sclera: Conjunctivae normal.     Pupils: Pupils are equal, round, and reactive to light.  Neck:     Musculoskeletal: Normal range of motion and neck supple.  Cardiovascular:     Rate and Rhythm: Normal rate and regular rhythm.     Heart sounds: Normal heart sounds.  Pulmonary:     Effort: Pulmonary effort is normal.     Breath sounds: Normal breath sounds.  Abdominal:     General: Bowel sounds are normal.     Palpations: Abdomen is soft.  Lymphadenopathy:     Cervical: No cervical adenopathy.  Skin:    General: Skin is warm and dry.     Capillary Refill: Capillary refill takes less than 2 seconds.     Comments: Nail dystrophy noted  Scattered patches of psoriasis   Neurological:     Mental Status: He is alert and oriented to person, place, and time.  Psychiatric:        Behavior: Behavior normal.      Musculoskeletal Exam: C-spine, thoracic spine, and lumbar spine good ROM.  No midline spinal tenderness.  Shoulder joints, elbow joints, wrist joints, MCPs, PIPs, and DIPs good ROM with no synovitis.  Thickening of right 4th DIP joint.  Tenderness and synovitis of the right 1st PIP joint.  He has left 3rd MCP joint tenderness.  Hip joints good ROM with no discomfort.  Left knee warmth and effusion.  Right knee good ROM with no warmth or effusion.   Ankle joints good ROM with no warmth or tenderness.  Nail  dystrophy noted.  CDAI Exam: CDAI Score: 8.2  Patient Global Assessment: 6 (mm); Provider Global Assessment: 6 (mm) Swollen: 4 ; Tender: 4  Joint Exam      Right  Left  MCP 1  Swollen Tender     MCP 3      Tender  IP  Swollen Tender     DIP 5  Swollen      Knee     Swollen Tender     Investigation: No additional findings.  Imaging: No results found.  Recent Labs: Lab Results  Component Value Date   WBC 6.9 09/19/2018   HGB 15.8 09/19/2018   PLT 205 09/19/2018   NA 137 09/19/2018   K 4.8 09/19/2018   CL 103 09/19/2018  CO2 24 09/19/2018   GLUCOSE 101 (H) 09/19/2018   BUN 17 09/19/2018   CREATININE 0.98 09/19/2018   BILITOT 0.6 09/19/2018   ALKPHOS 48 06/12/2018   AST 52 (H) 09/19/2018   ALT 98 (H) 09/19/2018   PROT 7.2 09/19/2018   PROT 7.4 09/19/2018   ALBUMIN 4.6 06/12/2018   CALCIUM 10.1 09/19/2018   GFRAA 98 09/19/2018   QFTBGOLDPLUS NEGATIVE 09/19/2018    Speciality Comments: No specialty comments available.  Procedures:  No procedures performed Allergies: Gabapentin   Assessment / Plan:     Visit Diagnoses: Psoriatic arthritis [facility]): He presents today with a left knee joint effusion and warmth.  He has synovitis of the right 1st PIP joint and tenderness of the right 4th DIP and left 3rd MCP joint.  He continues to have scattered patches of psoriasis and nail dystrophy noted on exam.  He has no SI joint tenderness,  Achilles tendonitis, or plantar fasciitis.  He started on Otezla 14 days ago. He is taking Otezla 30 mg BID.  He has been tolerating it well.  He has no missed any doses recently.  He has not noticed any improvement since starting on Otezla.  He would like to proceed with starting on Cosentyx once he is cleared by his allergist/immunologist.  He will start on prednisone 20 mg and taper by 5 mg every 4 days.  He will continue on Otezla 30 mg BID.  He will follow up in 3 months.   Psoriasis - He has scattered patches of psoriasis.  Nail  dystrophy noted.  He uses Clobetasol topically PRN prescribed by his dermatologist.  High risk medication use - Otezla 30 mg by mouth twice daily.  Pending lab results. Once he is cleared to start on Cosentyx, we will schedule a nurse visit for his first injection.   Primary osteoarthritis of both hands:  He has synovitis of the right 1st PIP joint. He has thickening of the right 4th DIP joint.  He has chronic pain in both hands.  He has complete fist formation bilaterally.  Joint protection and muscle strengthening were discussed.    Primary osteoarthritis of both feet: He has discomfort in both feet but no joint swelling at this time.   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 Lab orders were placed today.  Once work up is complete and he has been cleared, he would like to start on Cosentyx.  Effusion, left knee: He has left knee warmth and a joint effusion.  He will be started on a prednisone taper.    Other medical conditions are listed as follows:   Moderate alcohol consumption  Family history of Wegener's granulomatosis - Brother died from Wegener's granulomatosis   Family history of ankylosing spondylitis - His son is prescribed Humira.   Orders: No orders of the defined types were placed in this encounter.  Meds ordered this encounter  Medications  . predniSONE (DELTASONE) 5 MG tablet    Sig: Take 4 tablets by mouth daily x4 days, 3 tablets by mouth daily x4 days, 2 tablets by mouth daily x4days, 1 tablet by mouth daily x4days.    Dispense:  40 tablet    Refill:  0    Face-to-face time spent with patient was 30 minutes. Greater than 50% of time was spent in counseling and coordination of care.  Follow-Up Instructions: Return in about 3 months (around 03/07/2019) for Psoriatic arthritis.   [name] PA-C  I examined and evaluated the patient with Lovena Le  Quita Skye PA.  Patient is still continues to have significant joint inflammation and swelling.  The synovitis and  effusions documented as above.  He has been taking Otezla only for 2 weeks.  We will see response to it.  Per his request another prednisone taper was given.  We will obtain labs as recommended by his immunologist.  Once the labs are available and he gets approval from the immunologist we may consider Cosentyx in case [name] does not work for him.  The plan of care was discussed as noted above.  Bo Merino, MD    Note - This record has been created using Editor, commissioning.  Chart creation errors have been sought, but may not always  have been located. Such creation errors do not reflect on  the standard of medical care.

## 2018-11-28 NOTE — Telephone Encounter (Signed)
Patient has been approved for [facility]. Prescription sent to the pharmacy. Left message to advise patient.

## 2018-12-05 ENCOUNTER — Encounter: Payer: Self-pay | Admitting: Rheumatology

## 2018-12-05 ENCOUNTER — Ambulatory Visit: Payer: BC Managed Care – PPO | Admitting: Rheumatology

## 2018-12-05 ENCOUNTER — Other Ambulatory Visit: Payer: Self-pay

## 2018-12-05 VITALS — BP 150/82 | HR 73 | Resp 15 | Ht 75.0 in | Wt 292.0 lb

## 2018-12-05 DIAGNOSIS — M25462 Effusion, left knee: Secondary | ICD-10-CM

## 2018-12-05 DIAGNOSIS — M19071 Primary osteoarthritis, right ankle and foot: Secondary | ICD-10-CM

## 2018-12-05 DIAGNOSIS — L409 Psoriasis, unspecified: Secondary | ICD-10-CM

## 2018-12-05 DIAGNOSIS — Z789 Other specified health status: Secondary | ICD-10-CM

## 2018-12-05 DIAGNOSIS — M19041 Primary osteoarthritis, right hand: Secondary | ICD-10-CM | POA: Diagnosis not present

## 2018-12-05 DIAGNOSIS — Z832 Family history of diseases of the blood and blood-forming organs and certain disorders involving the immune mechanism: Secondary | ICD-10-CM

## 2018-12-05 DIAGNOSIS — M19042 Primary osteoarthritis, left hand: Secondary | ICD-10-CM

## 2018-12-05 DIAGNOSIS — Z79899 Other long term (current) drug therapy: Secondary | ICD-10-CM | POA: Diagnosis not present

## 2018-12-05 DIAGNOSIS — M19072 Primary osteoarthritis, left ankle and foot: Secondary | ICD-10-CM

## 2018-12-05 DIAGNOSIS — Z8269 Family history of other diseases of the musculoskeletal system and connective tissue: Secondary | ICD-10-CM

## 2018-12-05 DIAGNOSIS — L4050 Arthropathic psoriasis, unspecified: Secondary | ICD-10-CM

## 2018-12-05 DIAGNOSIS — D809 Immunodeficiency with predominantly antibody defects, unspecified: Secondary | ICD-10-CM

## 2018-12-05 MED ORDER — PREDNISONE 5 MG PO TABS: ORAL_TABLET | ORAL | 0 refills | Status: DC

## 2018-12-05 NOTE — Progress Notes (Signed)
Pharmacy Note  Patient previously consented to [facility] and given 14 day starter pack.  He received an email from [facility] stating he would have to self-pay for the medication as it was not covered.  Our office received notice of Otezla approval. Believe they tried to process prescription prior to approval.  Instructed patient to call [facility] and have them try to process again.  If he has trouble filling prescription instructed him to call our office.  Gave him a maintenance dose sample in the meantime.  Medication Samples have been provided to the patient.  Drug name: [name]     Strength: 30 mg      Qty: 28   LOT: [id number]   Exp.Date: 07/2019  Dosing instructions: Take 1 tablet twice daily  The patient has been instructed regarding the correct time, dose, and frequency of taking this medication, including desired effects and most common side effects.   All questions encouraged and answered.  Instructed patient to call with any further questions or concerns.  Mariella Saa, PharmD, [facility] Rheumatology Clinical Pharmacist  12/05/2018 4:11 PM    Nathaniel Velazquez Nathaniel Velazquez 4:07 PM 12/05/2018

## 2018-12-27 ENCOUNTER — Other Ambulatory Visit: Payer: Self-pay | Admitting: Family Medicine

## 2018-12-27 ENCOUNTER — Telehealth: Payer: Self-pay | Admitting: *Deleted

## 2018-12-27 DIAGNOSIS — I10 Essential (primary) hypertension: Secondary | ICD-10-CM

## 2018-12-27 NOTE — Telephone Encounter (Signed)
Dr. Carlota Raspberry,   Nathaniel Velazquez states that his Allergist Dr. 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  Patient states [place] told patient he could have it done at his primary care.    I explained our protocol with outside orders.  Can we do the vaccine here, then he can go to [facility] to have blood drawn in 30 days?

## 2018-12-28 ENCOUNTER — Other Ambulatory Visit: Payer: Self-pay

## 2018-12-28 ENCOUNTER — Other Ambulatory Visit: Payer: Self-pay | Admitting: *Deleted

## 2018-12-28 ENCOUNTER — Ambulatory Visit: Payer: BC Managed Care – PPO | Admitting: Family Medicine

## 2018-12-28 DIAGNOSIS — Z23 Encounter for immunization: Secondary | ICD-10-CM

## 2018-12-28 NOTE — Telephone Encounter (Signed)
Order has been placed patient will come in have vaccine.  Was informed he will have to [facility] and have [facility] drawn .Marland KitchenMarland Kitchen

## 2018-12-28 NOTE — Progress Notes (Unsigned)
Patient here for Pneumococcal 23 only administered to Right deltoid IM. Patient tolerated vaccine well.

## 2018-12-28 NOTE — Telephone Encounter (Signed)
I am fine with [place] giving him Pneumovax 23, and can then have his blood drawn for [facility].  However we need to do that is fine.  Let me know if I need to enter any orders or sign off on orders.  Thank you.

## 2019-01-17 ENCOUNTER — Telehealth: Payer: Self-pay | Admitting: Rheumatology

## 2019-01-17 NOTE — Telephone Encounter (Signed)
Patient states he has been on [place] for 8 weeks. Patient states his skin has cleared up and the pain in his hands and fingers has improved. Patient states he is having pain and swelling in his left knee. Patient states he is having trouble going down stairs. Patient scheduled for 01/18/19 at 2:00 pm.

## 2019-01-17 NOTE — Telephone Encounter (Signed)
Patient called stating he has been taking Otezla for 8 weeks and his left knee is still very swollen and painful.  Patient states [name] told him it could take up to 4 months before he notices results, but patient is not sure if he is able to handle the swelling for that long.  Patient is requesting a return call.

## 2019-01-18 ENCOUNTER — Encounter: Payer: Self-pay | Admitting: Rheumatology

## 2019-01-18 ENCOUNTER — Other Ambulatory Visit: Payer: Self-pay

## 2019-01-18 ENCOUNTER — Ambulatory Visit (INDEPENDENT_AMBULATORY_CARE_PROVIDER_SITE_OTHER): Payer: BC Managed Care – PPO | Admitting: Rheumatology

## 2019-01-18 VITALS — BP 121/68 | HR 72 | Resp 15 | Ht 75.0 in | Wt 291.0 lb

## 2019-01-18 DIAGNOSIS — D809 Immunodeficiency with predominantly antibody defects, unspecified: Secondary | ICD-10-CM

## 2019-01-18 DIAGNOSIS — M19071 Primary osteoarthritis, right ankle and foot: Secondary | ICD-10-CM

## 2019-01-18 DIAGNOSIS — M19072 Primary osteoarthritis, left ankle and foot: Secondary | ICD-10-CM

## 2019-01-18 DIAGNOSIS — M25462 Effusion, left knee: Secondary | ICD-10-CM

## 2019-01-18 DIAGNOSIS — L409 Psoriasis, unspecified: Secondary | ICD-10-CM

## 2019-01-18 DIAGNOSIS — L4050 Arthropathic psoriasis, unspecified: Secondary | ICD-10-CM

## 2019-01-18 DIAGNOSIS — M19041 Primary osteoarthritis, right hand: Secondary | ICD-10-CM

## 2019-01-18 DIAGNOSIS — Z79899 Other long term (current) drug therapy: Secondary | ICD-10-CM

## 2019-01-18 DIAGNOSIS — M19042 Primary osteoarthritis, left hand: Secondary | ICD-10-CM

## 2019-01-18 DIAGNOSIS — Z789 Other specified health status: Secondary | ICD-10-CM

## 2019-01-18 MED ORDER — COSENTYX SENSOREADY PEN 150 MG/ML ~~LOC~~ SOAJ: 300.0000 mg | SUBCUTANEOUS | 0 refills | Status: DC

## 2019-01-18 NOTE — Progress Notes (Signed)
Office Visit Note  Patient: Nathaniel Velazquez             Date of Birth: 1960/10/09           MRN: [id number]             PCP: [name], MD Referring: [name], MD Visit Date: 01/18/2019 Occupation: @GUAROCC @  Subjective:  Other (left knee pain )  History of Present Illness: Nathaniel Velazquez is a 58 y.o. male with history of psoriatic arthritis and osteoarthritis.  He is taking Otezla 30 mg twice daily starting in May of 2020 and clobetasol cream topically as needed. He has noticed good skin clearance since starting [place].   His nail dystrophy has improved. He has noticed much improvement in joint pain in bilateral hands.  He still has joint pain and swelling in his left knee.  He has difficulty going down stairs.  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  He was given a Pneumovax 23 vaccine and his immunoglobulins will be tested to see if he builds adequate immunity.  He states the immunologist does not see a problem with him being on [place] or Cosentyx.    Activities of Daily Living:  Patient reports morning stiffness for several hours.   Patient Reports nocturnal pain.  Difficulty dressing/grooming: Denies Difficulty climbing stairs: Reports Difficulty getting out of chair: Reports Difficulty using hands for taps, buttons, cutlery, and/or writing: Denies  Review of Systems  Constitutional: Negative for fatigue.  HENT: Negative for mouth sores, mouth dryness and nose dryness.   Eyes: Negative for itching and dryness.  Respiratory: Negative for shortness of breath and difficulty breathing.   Cardiovascular: Negative for chest pain and palpitations.  Gastrointestinal: Negative for abdominal pain, constipation and diarrhea.  Endocrine: Negative for increased urination.  Genitourinary: Negative for painful urination and pelvic pain.  Musculoskeletal: Positive for arthralgias, joint pain, joint swelling and morning stiffness.  Skin: Negative for  hair loss and redness.  Allergic/Immunologic: Negative for susceptible to infections.  Neurological: Negative for dizziness, light-headedness, headaches, memory loss and weakness.  Hematological: Negative for bruising/bleeding tendency.  Psychiatric/Behavioral: Negative for confusion. The patient is not nervous/anxious.     PMFS History:  Patient Active Problem List   Diagnosis Date Noted  . 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 BMI 38.0-38.9,adult 09/19/2018  . Family history of Wegener's granulomatosis 09/19/2018  . Moderate alcohol consumption 09/19/2018  . Psoriasis 09/19/2018  . Psoriatic arthritis ([place]) 09/19/2018  . Rectal pain 06/05/2015  . Hypertension   . Obesity   . Hyperlipidemia 11/21/2008  . PALPITATIONS 10/30/2008    Past Medical History:  Diagnosis Date  . Arthritis   . GERD (gastroesophageal reflux disease)   . Hypertension   . Obesity    Steroid injection in rt knee    Family History  Problem Relation Age of Onset  . Hypertrophic cardiomyopathy Brother   . Heart disease Mother   . Hypertension Mother   . Heart disease Father   . Hypertension Father   . Obesity Brother   . Heart disease Brother   . Healthy Daughter   . Spondylitis Son   . Autoimmune disease Brother   . Healthy Daughter   . Colon cancer Neg Hx   . Colon polyps Neg Hx   . Esophageal cancer Neg Hx   . Pancreatic cancer Neg Hx   . Stomach cancer Neg Hx   . Rectal cancer Neg Hx  Past Surgical History:  Procedure Laterality Date  . ANAL FISTULECTOMY  1987  . APPENDECTOMY    . KNEE SURGERY Bilateral    meniscal tear repairs  . ROTATOR CUFF REPAIR Bilateral 2018  . 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 Influenza, Quadrivalent, Recombinant, Inj, Pf 06/05/2018  . Influenza,inj,Quad PF,6+ Mos 05/17/2013, 04/10/2014, 04/23/2015, 04/06/2017  . Influenza-Unspecified  05/19/2016, 05/18/2018  . Pneumococcal Polysaccharide-23 12/28/2018  . Td 07/19/1998  . Tdap 04/10/2014     Objective: Vital Signs: BP 121/68 (BP Location: Left Arm, Patient Position: Sitting, Cuff Size: Normal)   Pulse 72   Resp 15   Ht 6\' 3"  (1.905 m)   Wt 291 lb (132 kg)   BMI 36.37 kg/m    Physical Exam Vitals signs and nursing note reviewed.  Constitutional:      Appearance: He is well-developed.  HENT:     Head: Normocephalic and atraumatic.  Eyes:     Conjunctiva/sclera: Conjunctivae normal.     Pupils: Pupils are equal, round, and reactive to light.  Neck:     Musculoskeletal: Normal range of motion and neck supple.  Cardiovascular:     Rate and Rhythm: Normal rate and regular rhythm.     Heart sounds: Normal heart sounds.  Pulmonary:     Effort: Pulmonary effort is normal.     Breath sounds: Normal breath sounds.  Abdominal:     General: Bowel sounds are normal.     Palpations: Abdomen is soft.  Skin:    General: Skin is warm and dry.     Capillary Refill: Capillary refill takes less than 2 seconds.     Findings: Rash present.     Comments: He has few scattered psoriasis patches on his extremities.  Neurological:     Mental Status: He is alert and oriented to person, place, and time.  Psychiatric:        Behavior: Behavior normal.      Musculoskeletal Exam: C-spine, thoracic and lumbar spine were in good range of motion.  He had no SI joint tenderness.  Shoulder joints and elbow joints with good range of motion.  He has some synovitis over his DIP as described below.  He also had warmth and swelling in bilateral knee joints with moderate effusion in his left knee joint.  CDAI Exam: CDAI Score: 7.6  Patient Global: 8 mm; Provider Global: 8 mm Swollen: 3 ; Tender: 5  Joint Exam      Right  Left  MCP 2      Tender  MCP 3      Tender  DIP 5  Swollen Tender     Knee  Swollen Tender  Swollen Tender     Investigation: No additional findings.   Imaging: No results found.  Recent Labs: Lab Results  Component Value Date   WBC 6.9 09/19/2018   HGB 15.8 09/19/2018   PLT 205 09/19/2018   NA 137 09/19/2018   K 4.8 09/19/2018   CL 103 09/19/2018   CO2 24 09/19/2018   GLUCOSE 101 (H) 09/19/2018   BUN 17 09/19/2018   CREATININE 0.98 09/19/2018   BILITOT 0.6 09/19/2018   ALKPHOS 48 06/12/2018   AST 52 (H) 09/19/2018   ALT 98 (H) 09/19/2018   PROT 7.2 09/19/2018   PROT 7.4 09/19/2018   ALBUMIN 4.6 06/12/2018   CALCIUM 10.1 09/19/2018   GFRAA  98 09/19/2018   QFTBGOLDPLUS NEGATIVE 09/19/2018    Speciality Comments: No specialty comments available.  Procedures:  Large Joint Inj: L knee on 01/18/2019 3:03 PM Indications: pain Details: 27 G 1.5 in needle, medial approach  Arthrogram: No  Medications: 3 mL lidocaine 1 %; 60 mg triamcinolone acetonide 40 MG/ML Aspirate: 43 mL clear Outcome: tolerated well, no immediate complications Procedure, treatment alternatives, risks and benefits explained, specific risks discussed. Consent was given by the patient. Immediately prior to procedure a time out was called to verify the correct patient, procedure, equipment, support staff and site/side marked as required. Patient was prepped and draped in the usual sterile fashion.     Allergies: Gabapentin   Assessment / Plan:     Visit Diagnoses: Psoriatic arthritis ([place]) -he has improved on Otezla but not significantly.  He still have ongoing synovitis.  He has difficulty walking due to effusion in his knee joint.  He had detailed discussion regarding different treatment options.  He wants to proceed with Cosentyx.  He is already seen the immunologist who was in agreement that he can proceed with Cosentyx.  He will get his first loading dose of Cosentyx today.  After his first Cosentyx injection he was observed in the office for 30 minutes.  No side effects were noted.  Psoriasis -he does have some rash although it is improved  significantly.  High risk medication use - Otezla 30 mg by mouth twice daily.  He will start Cosentyx today.  I have advised him to discontinue Otezla after 1 week.  After the loading dose he will continue with monthly Cosentyx.  We will check labs in 4 weeks and then every 3 months.  Effusion, left knee -he had moderate effusion.  After informed consent was obtained left knee joint was prepped and aspirated as described above.  The synovial fluid was sent for analysis.  He tolerated the procedure well.  Plan: Synovial cell count + diff, w/ crystals, Anaerobic and Aerobic Culture, Anaerobic and Aerobic Culture, Synovial cell count + diff, w/ crystals,  Primary osteoarthritis of both hands -joint protection was discussed.  Primary osteoarthritis of both feet -he is currently not having much discomfort.  Immunoglobulin deficiency ([facility]) -IgG is normal but he has significant IgM and IgA deficiency.  Moderate alcohol consumption -he does not want to decrease alcohol intake.  For that reason he cannot take of methotrexate.  Other medical conditions are listed as follows:   Family history of Wegener's granulomatosis - Brother died from Wegener's granulomatosis   Family history of ankylosing spondylitis - His son is prescribed Humira.  Orders: Orders Placed This Encounter  Procedures  . Large Joint Inj  . Anaerobic and Aerobic Culture  . Synovial cell count + diff, w/ crystals   Meds ordered this encounter  Medications  . Secukinumab (COSENTYX SENSOREADY PEN) 150 MG/ML SOAJ    Sig: Inject 300 mg into the skin every 7 (seven) days. For 5 weeks. Then  Inject 300 mg every 28 days.    Dispense:  10 pen    Refill:  0  . Secukinumab (COSENTYX SENSOREADY PEN) 150 MG/ML SOAJ    Sig: Inject 300 mg into the skin every 7 (seven) days.    Dispense:  10 pen    Refill:  0    Order Specific Question:   Lot Number?    Answer:   [id number]     Follow-Up Instructions: Return in about 3 months (around  04/20/2019) for Psoriatic arthritis.  Bo Merino, MD  Note - This record has been created using Editor, commissioning.  Chart creation errors have been sought, but may not always  have been located. Such creation errors do not reflect on  the standard of medical care.

## 2019-01-18 NOTE — Progress Notes (Signed)
Pharmacy Note  Subjective:  Patient presents today to the [facility] to see [name].  Patient was seen by the pharmacist for counseling on Cosentyx for psoriatic arthritis and plaque psoriasis.  He is currently on Otezla with good response except for left knee effusion. He currently drinks 3-4 beers a night and has history of fatty liver disease so not a good candidate for MTX.   Objective:  CBC    Component Value Date/Time   WBC 6.9 09/19/2018 1128   RBC 4.75 09/19/2018 1128   HGB 15.8 09/19/2018 1128   HCT 43.7 09/19/2018 1128   PLT 205 09/19/2018 1128   MCV 92.0 09/19/2018 1128   MCV 94.4 05/02/2018 1418   MCH 33.3 (H) 09/19/2018 1128   MCHC 36.2 (H) 09/19/2018 1128   RDW 12.9 09/19/2018 1128   LYMPHSABS 2,077 09/19/2018 1128   EOSABS 511 (H) 09/19/2018 1128   BASOSABS 83 09/19/2018 1128    CMP     Component Value Date/Time   NA 137 09/19/2018 1128   NA 140 06/12/2018 1515   K 4.8 09/19/2018 1128   CL 103 09/19/2018 1128   CO2 24 09/19/2018 1128   GLUCOSE 101 (H) 09/19/2018 1128   BUN 17 09/19/2018 1128   BUN 10 06/12/2018 1515   CREATININE 0.98 09/19/2018 1128   CALCIUM 10.1 09/19/2018 1128   PROT 7.2 09/19/2018 1128   PROT 7.4 09/19/2018 1128   PROT 6.4 06/12/2018 1515   ALBUMIN 4.6 06/12/2018 1515   AST 52 (H) 09/19/2018 1128   ALT 98 (H) 09/19/2018 1128   ALKPHOS 48 06/12/2018 1515   BILITOT 0.6 09/19/2018 1128   BILITOT 1.2 06/12/2018 1515   GFRNONAA 85 09/19/2018 1128   GFRAA 98 09/19/2018 1128    Baseline Immunosuppressant Therapy Labs TB GOLD Quantiferon TB Gold Latest Ref Rng & Units 09/19/2018  Quantiferon TB Gold Plus NEGATIVE NEGATIVE   Hepatitis Panel Hepatitis Latest Ref Rng & Units 09/19/2018  Hep B Surface Ag NON-REACTI NON-REACTIVE  Hep B IgM NON-REACTI NON-REACTIVE  Hep C Ab NEGATIVE -  Hep C Ab NON-REACTI NON-REACTIVE  Hep C Ab NON-REACTI NON-REACTIVE   HIV Lab Results  Component Value Date   HIV NON-REACTIVE  09/19/2018   Immunoglobulins Immunoglobulin Electrophoresis Latest Ref Rng & Units 09/19/2018  IgA  47 - 310 mg/dL <5(L)  IgG 600 - 1,640 mg/dL 1,248  IgM 50 - 300 mg/dL 46(L)   SPEP Serum Protein Electrophoresis Latest Ref Rng & Units 09/19/2018  Total Protein 6.1 - 8.1 g/dL 7.4  Albumin 3.8 - 4.8 g/dL 4.2  Alpha-1 0.2 - 0.3 g/dL 0.3  Alpha-2 0.5 - 0.9 g/dL 0.8  Beta Globulin 0.4 - 0.6 g/dL 0.5  Beta 2 0.2 - 0.5 g/dL 0.3  Gamma Globulin 0.8 - 1.7 g/dL 1.2   G6PD No results found for: G6PDH TPMT No results found for: TPMT   Chest x-ray:  No active cardiopulmonary disease 05/02/2018  Does patient have a history of inflammatory bowel disease? No  Assessment/Plan:  Counseled patient that Cosentyx is a IL-17 inhibitor that works to reduce pain and inflammation associated with arthritis.  Counseled patient on purpose, proper use, and adverse effects of Cosentyx.  Reviewed the most common adverse effects of infection, inflammatory bowel disease, and allergic reaction.  Counseled patient that Cosentyx should be held prior to scheduled surgery.  Counseled patient to avoid live vaccines while on Cosentyx.  Recommend annual influenza, Pneumovax 23, Prevnar 13, and Shingrix as indicated.  Reviewed the importance of regular labs while on Cosentyx. Standing orders placed. Provided patient with medication education material and answered all questions.  Patient consented to Cosentyx.  Will upload consent into patient's chart.  Will apply for Cosentyx through patient's insurance.  Reviewed storage information for Cosentyx.  Advised initial injection must be administered in office.  Patient voiced understanding.    Patient dose will be for plaque psoriasis +/- psoriatic arthritis 300 mg every 7 days for 5 weeks then 300 mg every 28 days.Prescription sent to [facility] required by insurance.  He was given a co-pay card in office.  Demonstrated proper injection technique with Cosentyx  [employer].  Patient able to demonstrate proper injection technique using the teach back method.  Patient self injected in the anterior thigh with:  Sample Medication: Cosentyx Sensoready Pen 150 mg/ml NDC: 1245-8099-83 Lot: [id number] Expiration: 08/2019  Patient tolerated well.  Observed for 30 mins in office for adverse reaction and none noted. Instructed patient to call with any questions/issues.    He is to stop [place] in two weeks after starting Cosentyx.  All questions encouraged and answered.  Instructed patient to call with any further questions or concerns.  Mariella Saa, PharmD, [facility] Clinical Pharmacist  01/18/2019 3:30 PM

## 2019-01-18 NOTE — Patient Instructions (Addendum)
Stop taking Otezla in 2 weeks.  Cosentyx Dosing: Inject 300 mg (2 pens) every 7 days for 5 weeks and then 300 mg (2 pens) every 28 days.  Injection Schedule: 7/2, 7/9, 7/16, 7/23, 7/30 and then 8/27  Standing Labs We placed an order today for your standing lab work.    Please come back and get your standing labs in 1 month and then every 3 months.  We have open lab daily Monday through Thursday from 8:30-12:30 PM and 1:30-4:30 PM and Friday from 8:30-12:30 PM and 1:30 -4:00 PM at the office of Dr. Bo Merino.   You may experience shorter wait times on Monday and Friday afternoons. The office is located at [address], [place], [place], [place] [address] No appointment is necessary.   Labs are drawn by [facility].  You may receive a bill from [facility] for your lab work.  If you wish to have your labs drawn at another location, please call the office 24 hours in advance to send orders.  If you have any questions regarding directions or hours of operation,  please call [phone].   Just as a reminder please drink plenty of water prior to coming for your lab work. Thanks!

## 2019-01-22 NOTE — Progress Notes (Signed)
C/w inflammation

## 2019-01-24 LAB — ANAEROBIC AND AEROBIC CULTURE
AER RESULT:: NO GROWTH
MICRO NUMBER:: 633396
MICRO NUMBER:: 633397
SPECIMEN QUALITY:: ADEQUATE
SPECIMEN QUALITY:: ADEQUATE

## 2019-01-24 LAB — SYNOVIAL CELL COUNT + DIFF, W/ CRYSTALS
Basophils, %: 0 %
Eosinophils-Synovial: 0 % (ref 0–2)
Lymphocytes-Synovial Fld: 10 % (ref 0–74)
Monocyte/Macrophage: 24 % (ref 0–69)
Neutrophil, Synovial: 62 % — ABNORMAL HIGH (ref 0–24)
Synoviocytes, %: 4 % (ref 0–15)
WBC, Synovial: 2402 cells/uL — ABNORMAL HIGH (ref <150)

## 2019-01-31 ENCOUNTER — Other Ambulatory Visit: Payer: Self-pay | Admitting: Rheumatology

## 2019-01-31 DIAGNOSIS — L409 Psoriasis, unspecified: Secondary | ICD-10-CM

## 2019-01-31 DIAGNOSIS — L4050 Arthropathic psoriasis, unspecified: Secondary | ICD-10-CM

## 2019-02-05 ENCOUNTER — Telehealth: Payer: Self-pay | Admitting: Family Medicine

## 2019-02-05 NOTE — Telephone Encounter (Signed)
Patient is calling to request lab orders to be placed and to be drawn for Dr. Fredderick Phenix.  Dr. Fredderick Phenix is requesting [id number]- [id number] Pneumoniae Ab igg (23 Serotypes)  Patients CB- [phone]

## 2019-02-06 ENCOUNTER — Telehealth: Payer: Self-pay | Admitting: Family Medicine

## 2019-02-06 ENCOUNTER — Ambulatory Visit: Payer: BC Managed Care – PPO | Admitting: Physician Assistant

## 2019-02-06 ENCOUNTER — Telehealth: Payer: Self-pay | Admitting: Rheumatology

## 2019-02-06 NOTE — Telephone Encounter (Signed)
Patient called stating [name] wanted him to have labwork one month after beginning Cosentyx which will be 02/18/19.  Patient states he will be out of town at that time.  Patient is asking if he should come in 02/16/19 before he leaves or 02/26/19 when he returns.  Please advise.

## 2019-02-06 NOTE — Telephone Encounter (Signed)
This is a time sensitive order and Pt was to have blood work done 30 days after receiving a vaccine. 30 days was Thursday of last week. Pt was advised by Dr. Fredderick Phenix that he could walk in lab with the orders from his office but Pt would rather have an appt/ pt needs appt and orders put in asap/ please advise asap

## 2019-02-06 NOTE — Telephone Encounter (Signed)
Pt is needing to have labs drawn. [name] (?) sent him to another lab to get this done but the pt is not comfortable and wants to come to [place] to get them drawn. There are no orders for the pt to get this done. Please advise.

## 2019-02-06 NOTE — Telephone Encounter (Signed)
Patient advised he may come in for labs when he returns from his trip on 02/26/19. Patient verbalized understanding.

## 2019-02-07 NOTE — Telephone Encounter (Signed)
Pt called back to ask about pt the orders in for these labs.  He stated he has call the last few days to see if this could be done.  Please advise  Best number [phone]

## 2019-02-09 ENCOUNTER — Telehealth: Payer: Self-pay

## 2019-02-09 NOTE — Telephone Encounter (Signed)
Pt c/b re labwork from [name].  States [name] told him he could go anywhere and have it done.  Pt wants to make appt with Dr. Carlota Raspberry re labs.  On hold for scheduling.

## 2019-02-09 NOTE — Telephone Encounter (Signed)
Patient was informed we are not a draw station for other DR and patient is requesting these lab done here. Patient then said he need to schedule an appt with Dr Carlota Raspberry so Dr  Carlota Raspberry can order these labs.

## 2019-02-09 NOTE — Telephone Encounter (Signed)
Patient has been schedule with Dr Carlota Raspberry but was informed we probably will not draw labs for another provider but we will let him and Dr Carlota Raspberry talk that over

## 2019-02-15 ENCOUNTER — Other Ambulatory Visit: Payer: Self-pay

## 2019-02-15 ENCOUNTER — Encounter: Payer: Self-pay | Admitting: Family Medicine

## 2019-02-15 ENCOUNTER — Ambulatory Visit: Payer: BC Managed Care – PPO | Admitting: Family Medicine

## 2019-02-15 VITALS — BP 144/88 | HR 78 | Temp 98.1°F | Resp 16 | Wt 289.6 lb

## 2019-02-15 DIAGNOSIS — D802 Selective deficiency of immunoglobulin A [IgA]: Secondary | ICD-10-CM | POA: Diagnosis not present

## 2019-02-15 DIAGNOSIS — R7989 Other specified abnormal findings of blood chemistry: Secondary | ICD-10-CM

## 2019-02-15 DIAGNOSIS — R945 Abnormal results of liver function studies: Secondary | ICD-10-CM | POA: Diagnosis not present

## 2019-02-15 DIAGNOSIS — I10 Essential (primary) hypertension: Secondary | ICD-10-CM | POA: Diagnosis not present

## 2019-02-15 DIAGNOSIS — B999 Unspecified infectious disease: Secondary | ICD-10-CM | POA: Diagnosis not present

## 2019-02-15 DIAGNOSIS — N529 Male erectile dysfunction, unspecified: Secondary | ICD-10-CM

## 2019-02-15 MED ORDER — LISINOPRIL 10 MG PO TABS: 10.0000 mg | ORAL_TABLET | Freq: Every day | ORAL | 1 refills | Status: DC

## 2019-02-15 MED ORDER — METOPROLOL SUCCINATE ER 25 MG PO TB24: 25.0000 mg | ORAL_TABLET | Freq: Every day | ORAL | 1 refills | Status: DC

## 2019-02-15 MED ORDER — SILDENAFIL CITRATE 20 MG PO TABS: ORAL_TABLET | ORAL | 5 refills | Status: DC

## 2019-02-15 NOTE — Progress Notes (Signed)
Subjective:    Patient ID: Nathaniel Velazquez, male    DOB: [birth year], 58 y.o.   MRN: [id number]  HPI Nathaniel Velazquez is a 58 y.o. male Presents today for: Chief Complaint  Patient presents with  . Consult    Dr Jonny Ruiz wanted me to have blood work done. They sent me to [facility] to have these done but He want to get done here. 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  Followed by Dr. Fredderick Phenix was [facility] endocrinology, April 29 note reviewed. April 29 labs obtained with immunoglobulins, strep pneumo levels.  Prescription provided from May 28 from Dr. Fredderick Phenix for Pneumovax 23, had pneumovax given 12/28/18. Needs updated levels - strep pneumo Ab IgG.   Immunization History  Administered Date(s) Administered  . Influenza, Quadrivalent, Recombinant, Inj, Pf 06/05/2018  . Influenza,inj,Quad PF,6+ Mos 05/17/2013, 04/10/2014, 04/23/2015, 04/06/2017  . Influenza-Unspecified 05/19/2016, 05/18/2018  . Pneumococcal Polysaccharide-23 12/28/2018  . Td 07/19/1998  . Tdap 04/10/2014    ICD 10 code B99.9.  Followed by [name] for psoriatic arthritis.  Recently started on Cosentyx, plan for lab work after he returns from trip in August.  Did have elevated LFTs March 3.  He does drink alcohol, methotrexate was not used due to alcohol use. Plan for repeat labs in 2 weeks.   Hypertension: BP Readings from Last 3 Encounters:  02/15/19 (!) 144/88  01/18/19 121/68  12/05/18 (!) 150/82   Lab Results  Component Value Date   CREATININE 0.98 09/19/2018  He is taking Toprol 25 mg daily, lisinopril 10 mg daily Sildenafil 20 mg to 60 mg as needed for erectile dysfunction.  Home BP - 120/70-80.  No new side effects.  Occasional sildenafil. Min HA, no flushing/vision/hearing sx's or chest pains with exertion.   Elevated LFT's 2-3  liquor drinks per day.  [place] RUQ in 2017, heterogenous hepatic echotexture - likely fatty infiltrative disease. No GI eval recently - told about 10 years ago was d/t weight.  Lab Results  Component Value Date   ALT 98 (H) 09/19/2018   AST 52 (H) 09/19/2018   ALKPHOS 48 06/12/2018   BILITOT 0.6 09/19/2018   Patient Active Problem List   Diagnosis Date Noted  . 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 BMI 38.0-38.9,adult 09/19/2018  . Family history of Wegener's granulomatosis 09/19/2018  . Moderate alcohol consumption 09/19/2018  . Psoriasis 09/19/2018  . Psoriatic arthritis ([place]) 09/19/2018  . Rectal pain 06/05/2015  . Hypertension   . Obesity   . Hyperlipidemia 11/21/2008  . PALPITATIONS 10/30/2008   Past Medical History:  Diagnosis Date  . Arthritis   . GERD (gastroesophageal reflux disease)   . Hypertension   . Obesity    Steroid injection in rt knee   Past Surgical History:  Procedure Laterality Date  . ANAL FISTULECTOMY  1987  . APPENDECTOMY    . KNEE SURGERY Bilateral    meniscal tear repairs  . ROTATOR CUFF REPAIR Bilateral 2018  . TONSILLECTOMY     Allergies  Allergen Reactions  . Gabapentin Other (See Comments)    Made very jittery   Prior to Admission medications   Medication Sig Start Date End Date Taking? Authorizing Provider  clobetasol ointment (TEMOVATE) 0.05 % as needed. As directed 05/15/12  Yes [provider]  ibuprofen (ADVIL) 200 MG tablet Take 600-800  mg by mouth daily.   Yes [provider]  lisinopril (ZESTRIL) 10 MG tablet Take 1 tablet (10 mg total) by mouth daily. 02/15/19  Yes [name], MD  metoprolol succinate (TOPROL-XL) 25 MG 24 hr tablet Take 1 tablet (25 mg total) by mouth daily. 02/15/19  Yes [name], MD  nystatin-triamcinolone ointment (MYCOLOG) APPLY SPARINGLY TO THE AFFECTED AREA FOR UP TO 7 DAYS Patient taking differently: as needed. APPLY SPARINGLY TO THE AFFECTED AREA FOR UP TO 7 DAYS  06/23/18  Yes [name], MD  psyllium (METAMUCIL) 58.6 % powder Take 1 packet by mouth 2 (two) times daily.    Yes [provider]  Secukinumab (COSENTYX SENSOREADY PEN) 150 MG/ML SOAJ Inject 300 mg into the skin every 7 (seven) days. For 5 weeks. Then  Inject 300 mg every 28 days. 01/18/19  Yes Deveshwar, Abel Presto, MD  Secukinumab (COSENTYX SENSOREADY PEN) 150 MG/ML SOAJ Inject 300 mg into the skin every 7 (seven) days. 01/18/19  Yes Bo Merino, MD  sildenafil (REVATIO) 20 MG tablet 1-3 po qd prn. 02/15/19  Yes [name], MD   Social History   Socioeconomic History  . Marital status: Married    Spouse name: Not on file  . Number of children: Not on file  . Years of education: Not on file  . Highest education level: Not on file  Occupational History  . Not on file  Social Needs  . Financial resource strain: Not on file  . Food insecurity    Worry: Not on file    Inability: Not on file  . Transportation needs    Medical: Not on file    Non-medical: Not on file  Tobacco Use  . Smoking status: Never Smoker  . Smokeless tobacco: Never Used  Substance and Sexual Activity  . Alcohol use: Yes    Comment: daily   . Drug use: No  . Sexual activity: Never  Lifestyle  . Physical activity    Days per week: Not on file    Minutes per session: Not on file  . Stress: Not on file  Relationships  . Social Herbalist on phone: Not on file    Gets together: Not on file    Attends religious service: Not on file    Active member of club or organization: Not on file    Attends meetings of clubs or organizations: Not on file    Relationship status: Not on file  . Intimate partner violence    Fear of current or ex partner: Not on file    Emotionally abused: Not on file    Physically abused: Not on file    Forced sexual activity: Not on file  Other Topics Concern  . Not on file  Social History Narrative   Sales rep    Review of Systems   Constitutional: Negative for fatigue and unexpected weight change.  Eyes: Negative for visual disturbance.  Respiratory: Negative for cough, chest tightness and shortness of breath.   Cardiovascular: Negative for chest pain, palpitations and leg swelling.  Gastrointestinal: Negative for abdominal pain and blood in stool.  Neurological: Negative for dizziness (rare with quick standing. ), light-headedness and headaches.       Objective:   Physical Exam Vitals:   02/15/19 1446 02/15/19 1449  BP: (!) 162/90 (!) 144/88  Pulse: 78   Resp: 16   Temp: 98.1 F (36.7 C)   TempSrc: Oral   SpO2:  96%   Weight: 289 lb 9.6 oz (131.4 kg)           Assessment & Plan:   Nathaniel Velazquez is a 58 y.o. 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  Fax results to Dr. Fredderick Phenix.  Essential hypertension - Plan: metoprolol succinate (TOPROL-XL) 25 MG 24 hr tablet, lisinopril (ZESTRIL) 10 MG tablet  -Borderline control in office, improved at home.  Monitor home readings, RTC precautions, continue same regimen for now.  Recent labs reviewed and we will plan on having upcoming lab work in 2 weeks  Elevated LFTs  -Decreased alcohol discussed, has had ongoing elevated LFTs, one time did revert to normal but persistent elevations past few years.  Prior ultrasound indicated fatty liver but that was 3 years ago  -  If still elevated on upcoming tests, will consider hepatology evaluation to review repeat testing interval with ultrasound or CT, other recommendations.  Erectile dysfunction, unspecified erectile dysfunction type - Plan: sildenafil (REVATIO) 20 MG tablet  - SILDENAFIL RX  given - use lowest effective dose. Side effects discussed (including but not limited to headache/flushing, blue discoloration of vision, possible vascular steal and risk of cardiac effects if  underlying unknown coronary artery disease, and permanent sensorineural hearing loss). Understanding expressed.   Meds ordered this encounter  Medications  . metoprolol succinate (TOPROL-XL) 25 MG 24 hr tablet    Sig: Take 1 tablet (25 mg total) by mouth daily.    Dispense:  90 tablet    Refill:  1  . lisinopril (ZESTRIL) 10 MG tablet    Sig: Take 1 tablet (10 mg total) by mouth daily.    Dispense:  90 tablet    Refill:  1  . sildenafil (REVATIO) 20 MG tablet    Sig: 1-3 po qd prn.    Dispense:  10 tablet    Refill:  5   Patient Instructions     If liver tests remain elevated, I would recommend meeting with hepatologist to discuss testing interval and ultrasound/imaging intervals. Decreasing alcohol intake may also help.   Keep follow up for labs with rheumatology, allergist.   Keep a record of your blood pressures outside of the office and if over 140/90 - return to discuss med changes.   Thanks for coming in today.    If you have lab work done today you will be contacted with your lab results within the next 2 weeks.  If you have not heard from [place] then please contact us. The fastest way to get your results is to register for My Chart.   IF you received an x-ray today, you will receive an invoice from [facility]. Please contact [facility] at [phone] with questions or concerns regarding your invoice.   IF you received labwork today, you will receive an invoice from [place]. Please contact [facility] at [phone] with questions or concerns regarding your invoice.   Our billing staff will not be able to assist you with questions regarding bills from these companies.  You will be contacted with the lab results as soon as they are available. The fastest way to get your results is to activate your My Chart account. Instructions are located on the last page of this paperwork. If you have not heard from [place] regarding the results in 2 weeks, please contact  this office.

## 2019-02-15 NOTE — Patient Instructions (Addendum)
   If liver tests remain elevated, I would recommend meeting with hepatologist to discuss testing interval and ultrasound/imaging intervals. Decreasing alcohol intake may also help.   Keep follow up for labs with rheumatology, allergist.   Keep a record of your blood pressures outside of the office and if over 140/90 - return to discuss med changes.   Thanks for coming in today.    If you have lab work done today you will be contacted with your lab results within the next 2 weeks.  If you have not heard from [place] then please contact us. The fastest way to get your results is to register for My Chart.   IF you received an x-ray today, you will receive an invoice from [facility]. Please contact [facility] at [phone] with questions or concerns regarding your invoice.   IF you received labwork today, you will receive an invoice from [place]. Please contact [facility] at [phone] with questions or concerns regarding your invoice.   Our billing staff will not be able to assist you with questions regarding bills from these companies.  You will be contacted with the lab results as soon as they are available. The fastest way to get your results is to activate your My Chart account. Instructions are located on the last page of this paperwork. If you have not heard from [place] regarding the results in 2 weeks, please contact this office.

## 2019-02-20 ENCOUNTER — Other Ambulatory Visit: Payer: Self-pay | Admitting: Rheumatology

## 2019-02-20 DIAGNOSIS — L409 Psoriasis, unspecified: Secondary | ICD-10-CM

## 2019-02-20 DIAGNOSIS — L4050 Arthropathic psoriasis, unspecified: Secondary | ICD-10-CM

## 2019-02-22 LAB — STREP PNEUMONIAE 23 SEROTYPES IGG
Pneumo Ab Type 1*: >13.1 ug/mL (ref >1.3)
Pneumo Ab Type 12 (12F)*: 10.8 ug/mL (ref >1.3)
Pneumo Ab Type 14*: 3.5 ug/mL (ref >1.3)
Pneumo Ab Type 17 (17F)*: 0.7 ug/mL — ABNORMAL LOW (ref >1.3)
Pneumo Ab Type 19 (19F)*: 10.4 ug/mL (ref >1.3)
Pneumo Ab Type 2*: 3.2 ug/mL (ref >1.3)
Pneumo Ab Type 20*: 2.8 ug/mL (ref >1.3)
Pneumo Ab Type 22 (22F)*: 3.7 ug/mL (ref >1.3)
Pneumo Ab Type 23 (23F)*: 2.1 ug/mL (ref >1.3)
Pneumo Ab Type 26 (6B)*: 3.1 ug/mL (ref >1.3)
Pneumo Ab Type 3*: 19.6 ug/mL (ref >1.3)
Pneumo Ab Type 34 (10A)*: >28.8 ug/mL (ref >1.3)
Pneumo Ab Type 4*: 1.6 ug/mL (ref >1.3)
Pneumo Ab Type 43 (11A)*: >12.9 ug/mL (ref >1.3)
Pneumo Ab Type 5*: >74 ug/mL (ref >1.3)
Pneumo Ab Type 51 (7F)*: 14 ug/mL (ref >1.3)
Pneumo Ab Type 54 (15B)*: 3.6 ug/mL (ref >1.3)
Pneumo Ab Type 56 (18C)*: 1 ug/mL — ABNORMAL LOW (ref >1.3)
Pneumo Ab Type 57 (19A)*: 11.7 ug/mL (ref >1.3)
Pneumo Ab Type 68 (9V)*: 2.6 ug/mL (ref >1.3)
Pneumo Ab Type 70 (33F)*: 12.9 ug/mL (ref >1.3)
Pneumo Ab Type 8*: 22.5 ug/mL (ref >1.3)
Pneumo Ab Type 9 (9N)*: 2.6 ug/mL (ref >1.3)

## 2019-03-02 ENCOUNTER — Other Ambulatory Visit: Payer: Self-pay

## 2019-03-02 DIAGNOSIS — Z79899 Other long term (current) drug therapy: Secondary | ICD-10-CM

## 2019-03-05 ENCOUNTER — Telehealth: Payer: Self-pay | Admitting: *Deleted

## 2019-03-05 MED ORDER — PREDNISONE 5 MG PO TABS: ORAL_TABLET | ORAL | 0 refills | Status: DC

## 2019-03-05 NOTE — Telephone Encounter (Signed)
He was started on Cosentyx on July 2.  It will take about 3 months for medication to get into his system.  He can try a prednisone taper as a bridging therapy.  We can start him on prednisone 20 mg p.o. daily and taper by 5 mg every 4 days.

## 2019-03-05 NOTE — Telephone Encounter (Signed)
Patient states about a week after he did the loading dose of Cosentyx he noticed his left knee swelling up and swelling in the left hand. Patient states he is having trouble writing or playing guitar. Patient states he is due for his next injection on 03/15/19. [place] and [place] dune and stairs. Please advise.

## 2019-03-05 NOTE — Telephone Encounter (Signed)
Patient returned call to the office. Patient advised it take about 3 months for medication to get into his system. Patient advised to call the office if he has seen no improvement after the prednisone taper is completed.

## 2019-03-05 NOTE — Telephone Encounter (Signed)
Left message to advise patient [name] advise a prescription for Prednisone to be sent to the pharmacy. Sent prescription to the pharmacy.

## 2019-03-05 NOTE — Progress Notes (Signed)
CBC is normal.

## 2019-03-06 LAB — CBC WITH DIFFERENTIAL/PLATELET
Absolute Monocytes: 800 cells/uL (ref 200–950)
Basophils Absolute: 69 cells/uL (ref 0–200)
Basophils Relative: 1 %
Eosinophils Absolute: 311 cells/uL (ref 15–500)
Eosinophils Relative: 4.5 %
HCT: 44.2 % (ref 38.5–50.0)
Hemoglobin: 15.5 g/dL (ref 13.2–17.1)
Lymphs Abs: 2063 cells/uL (ref 850–3900)
MCH: 32.5 pg (ref 27.0–33.0)
MCHC: 35.1 g/dL (ref 32.0–36.0)
MCV: 92.7 fL (ref 80.0–100.0)
MPV: 9.7 fL (ref 7.5–12.5)
Monocytes Relative: 11.6 %
Neutro Abs: 3657 cells/uL (ref 1500–7800)
Neutrophils Relative %: 53 %
Platelets: 235 10*3/uL (ref 140–400)
RBC: 4.77 10*6/uL (ref 4.20–5.80)
RDW: 13.3 % (ref 11.0–15.0)
Total Lymphocyte: 29.9 %
WBC: 6.9 10*3/uL (ref 3.8–10.8)

## 2019-03-06 LAB — COMPLETE METABOLIC PANEL WITH GFR

## 2019-03-06 NOTE — Progress Notes (Signed)
Please explained to the patient that the CMP was canceled due to some lab issue.  He will need CMP.

## 2019-03-12 ENCOUNTER — Other Ambulatory Visit: Payer: Self-pay

## 2019-03-12 DIAGNOSIS — Z79899 Other long term (current) drug therapy: Secondary | ICD-10-CM

## 2019-03-13 LAB — COMPLETE METABOLIC PANEL WITH GFR
AG Ratio: 1.6 (calc) (ref 1.0–2.5)
ALT: 131 U/L — ABNORMAL HIGH (ref 9–46)
AST: 70 U/L — ABNORMAL HIGH (ref 10–35)
Albumin: 4.3 g/dL (ref 3.6–5.1)
Alkaline phosphatase (APISO): 71 U/L (ref 35–144)
BUN: 17 mg/dL (ref 7–25)
CO2: 22 mmol/L (ref 20–32)
Calcium: 9.4 mg/dL (ref 8.6–10.3)
Chloride: 104 mmol/L (ref 98–110)
Creat: 0.96 mg/dL (ref 0.70–1.33)
GFR, Est African American: 100 mL/min/{1.73_m2} (ref >=60)
GFR, Est Non African American: 86 mL/min/{1.73_m2} (ref >=60)
Globulin: 2.7 g/dL (calc) (ref 1.9–3.7)
Glucose, Bld: 278 mg/dL — ABNORMAL HIGH (ref 65–99)
Potassium: 4.6 mmol/L (ref 3.5–5.3)
Sodium: 134 mmol/L — ABNORMAL LOW (ref 135–146)
Total Bilirubin: 0.7 mg/dL (ref 0.2–1.2)
Total Protein: 7 g/dL (ref 6.1–8.1)

## 2019-03-13 NOTE — Progress Notes (Signed)
Patient's LFTs had been elevated in the past.  His LFTs are elevated again.  Please ask if he is taking any NSAIDs.  He should avoid all NSAIDs, Tylenol and alcohol.  Weight loss is advised to decrease the chances of fatty liver.

## 2019-03-16 ENCOUNTER — Telehealth: Payer: Self-pay | Admitting: *Deleted

## 2019-03-16 DIAGNOSIS — R7989 Other specified abnormal findings of blood chemistry: Secondary | ICD-10-CM

## 2019-03-16 NOTE — Telephone Encounter (Signed)
-----   Message from Bo Merino, MD sent at 03/16/2019  1:59 PM EDT ----- Have patient repeat LFTs in 3 weeks.

## 2019-03-16 NOTE — Progress Notes (Signed)
Have patient repeat LFTs in 3 weeks.

## 2019-03-19 ENCOUNTER — Other Ambulatory Visit: Payer: Self-pay | Admitting: Rheumatology

## 2019-03-19 DIAGNOSIS — L409 Psoriasis, unspecified: Secondary | ICD-10-CM

## 2019-03-19 DIAGNOSIS — L4050 Arthropathic psoriasis, unspecified: Secondary | ICD-10-CM

## 2019-03-19 NOTE — Telephone Encounter (Signed)
Last Visit: 01/18/19 Next Visit: 04/19/19 Labs: 03/12/19 elevated LFTs, CBC WNL TB Gold: 09/19/18 Neg   Okay to refill per [name]

## 2019-03-27 NOTE — Progress Notes (Signed)
Office Visit Note  Patient: Nathaniel Velazquez             Date of Birth: [birth year]           MRN: [id number]             PCP: [name], MD Referring: [name], MD Visit Date: 03/28/2019 Occupation: @GUAROCC @  Subjective:  Left knee joint pain   History of Present Illness: Nathaniel Velazquez is a 58 y.o. male with history of psoriatic arthritis and osteoarthritis. He is on Cosentyx 300 mg sq injections every 28 days.  He started on Cosentyx on 01/18/19.  He initially noticed improvement while on the loading dosing.  He states his nail changes and psoriasis has cleared.  He presents today with left knee joint pain and joint swelling.  He discontinued a prednisone taper 1 week ago.  He is having difficulty climbing steps and getting up from a chair.  He has also been experiencing nocturnal pain in the left knee joint.  He had a left knee joint cortisone injection aspiration performed on 01/18/2019.  He would like an aspiration today.  He denies any other joint pain or joint swelling at this time.  He denies any side effects of Cosentyx or injection site reactions.  He has not had any recent infections.   Activities of Daily Living:  Patient reports morning stiffness for several hours.   Patient Reports nocturnal pain.  Difficulty dressing/grooming: Reports Difficulty climbing stairs: Reports Difficulty getting out of chair: Reports Difficulty using hands for taps, buttons, cutlery, and/or writing: Denies  Review of Systems  Constitutional: Negative for fatigue.  HENT: Negative for mouth sores, mouth dryness and nose dryness.   Eyes: Negative for itching and dryness.  Respiratory: Negative for shortness of breath, wheezing and difficulty breathing.   Cardiovascular: Negative for chest pain and palpitations.  Gastrointestinal: Negative for blood in stool, constipation and diarrhea.  Endocrine: Negative for excessive thirst.  Genitourinary: Positive for urgency. Negative for  difficulty urinating and painful urination.  Musculoskeletal: Positive for arthralgias, joint pain, joint swelling and morning stiffness.  Skin: Negative for rash.  Allergic/Immunologic: Negative for susceptible to infections.  Neurological: Negative for dizziness, headaches, memory loss and weakness.  Hematological: Negative for bruising/bleeding tendency.  Psychiatric/Behavioral: Negative for confusion. The patient is not nervous/anxious.     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   BMI 38.0-38.9,adult 09/19/2018   Family history of Wegener's granulomatosis 09/19/2018   Moderate alcohol consumption 09/19/2018   Psoriasis 09/19/2018   Psoriatic arthritis ([place]) 09/19/2018   Rectal pain 06/05/2015   Hypertension    Obesity    Hyperlipidemia 11/21/2008   PALPITATIONS 10/30/2008    Past Medical History:  Diagnosis Date   Arthritis    GERD (gastroesophageal reflux disease)    Hypertension    Obesity    Steroid injection in rt knee    Family History  Problem Relation Age of Onset   Hypertrophic cardiomyopathy Brother    Heart disease Mother    Hypertension Mother    Heart disease Father    Hypertension Father    Obesity Brother    Heart disease Brother    Healthy Daughter    Spondylitis Son    Autoimmune disease Brother    Healthy Daughter    Colon cancer Neg Hx    Colon polyps Neg Hx    Esophageal cancer Neg Hx  Pancreatic cancer Neg Hx    Stomach cancer Neg Hx    Rectal cancer Neg Hx    Past Surgical History:  Procedure Laterality Date   ANAL FISTULECTOMY  1987   APPENDECTOMY     KNEE SURGERY Bilateral    meniscal tear repairs   ROTATOR CUFF REPAIR Bilateral 2018   TONSILLECTOMY     Social History   Social History Narrative   Sales rep   Immunization History  Administered Date(s) Administered   Influenza, Quadrivalent, Recombinant, Inj, Pf  06/05/2018   Influenza,inj,Quad PF,6+ Mos 05/17/2013, 04/10/2014, 04/23/2015, 04/06/2017   Influenza-Unspecified 05/19/2016, 05/18/2018   Pneumococcal Polysaccharide-23 12/28/2018   Td 07/19/1998   Tdap 04/10/2014     Objective: Vital Signs: BP 117/67 (BP Location: Left Arm, Patient Position: Sitting, Cuff Size: Normal)    Pulse 67    Resp 15    Ht 6' 2.5" (1.892 m)    Wt 286 lb (129.7 kg)    BMI 36.23 kg/m    Physical Exam Vitals signs and nursing note reviewed.  Constitutional:      Appearance: He is well-developed.  HENT:     Head: Normocephalic and atraumatic.  Eyes:     Conjunctiva/sclera: Conjunctivae normal.     Pupils: Pupils are equal, round, and reactive to light.  Neck:     Musculoskeletal: Normal range of motion and neck supple.  Cardiovascular:     Rate and Rhythm: Normal rate and regular rhythm.     Heart sounds: Normal heart sounds.  Pulmonary:     Effort: Pulmonary effort is normal.     Breath sounds: Normal breath sounds.  Abdominal:     General: Bowel sounds are normal.     Palpations: Abdomen is soft.  Skin:    General: Skin is warm and dry.     Capillary Refill: Capillary refill takes less than 2 seconds.  Neurological:     Mental Status: He is alert and oriented to person, place, and time.  Psychiatric:        Behavior: Behavior normal.      Musculoskeletal Exam: C-spine, thoracic spine, lumbar spine good range of motion.  No midline spinal tenderness.  No SI joint tenderness.  Shoulder joints, elbow joints, wrist joints, MCPs, PIPs, DIPs good range of motion with no synovitis.  He has complete fist formation bilaterally.  Hip joints have good range of motion with no discomfort.  Left knee joint moderate effusion and warmth.  Right knee has good range of motion with no warmth or effusion.  Ankle joints, MTPs, PIPs, DIPs good range of motion with no synovitis.  No tenderness or inflammation of ankle joints.  No Achilles tendinitis or plantar  fasciitis.  CDAI Exam: CDAI Score: -- Patient Global: --; Provider Global: -- Swollen: 1 ; Tender: 1  Joint Exam      Right  Left  Knee     Swollen Tender     Investigation: No additional findings.  Imaging: No results found.  Recent Labs: Lab Results  Component Value Date   WBC 6.9 03/02/2019   HGB 15.5 03/02/2019   PLT 235 03/02/2019   NA 134 (L) 03/12/2019   K 4.6 03/12/2019   CL 104 03/12/2019   CO2 22 03/12/2019   GLUCOSE 278 (H) 03/12/2019   BUN 17 03/12/2019   CREATININE 0.96 03/12/2019   BILITOT 0.7 03/12/2019   ALKPHOS 48 06/12/2018   AST 70 (H) 03/12/2019   ALT 131 (H) 03/12/2019   PROT  7.0 03/12/2019   ALBUMIN 4.6 06/12/2018   CALCIUM 9.4 03/12/2019   GFRAA 100 03/12/2019   QFTBGOLDPLUS NEGATIVE 09/19/2018    Speciality Comments: No specialty comments available.  Procedures:  Large Joint Inj: L knee on 03/28/2019 2:16 PM Indications: pain Details: 27 G 1.5 in needle, medial approach  Arthrogram: No  Medications: 40 mg triamcinolone acetonide 40 MG/ML; 3 mL lidocaine 1 % Aspirate: 31 mL Outcome: tolerated well, no immediate complications Procedure, treatment alternatives, risks and benefits explained, specific risks discussed. Consent was given by the patient. Immediately prior to procedure a time out was called to verify the correct patient, procedure, equipment, support staff and site/side marked as required. Patient was prepped and draped in the usual sterile fashion.     Allergies: Gabapentin    Assessment / Plan:     Visit Diagnoses: Psoriatic arthritis ([place]): He presents today with a moderate effusion in the left knee joint.  He has been experiencing pain and swelling in the left knee for the past 1 week.  He discontinued a prednisone taper 1 week ago.  He was started on Cosentyx 300 mg subcutaneous injections on 01/18/2019.  He initially noticed improvement while on Cosentyx loading dose.  His psoriasis has cleared.  No nail dystrophy was  noted.  He has no other joint pain or joint swelling at this time.  He has no Achilles tendinitis or plantar fasciitis.  He has no SI joint tenderness.  I left knee joint aspiration and cortisone injection were performed today.  30 mL of clear fluid was drawn off.  He tolerated the procedure well.  We discussed spacing the dose of Cosentyx to 150 mg every 14 days.  He is in agreement.  He will follow-up in about 6 weeks to assess his response to the change in dosing.  Psoriasis: Psoriasis has cleared since starting on Cosentyx.  No nail dystrophy noted.  High risk medication use - Cosentyx 150 mg sq injections every 14 days.  He was started on Cosentyx on 01/18/2019.  He initially was injecting 300 mg subcutaneously every 28 days but will start spacing his dosing to every 14 days.  Inadequate response to [facility] in the past.  He has history of elevated LFTs due to fatty liver and alcohol consumption.  He discontinued alcohol consumption 2 weeks ago and has avoided taking NSAIDs and Tylenol.  He has also been trying to lose weight.  Effusion, left knee - He presents today with a moderate effusion in the left knee joint that started 1 week ago.  He discontinued a prednisone taper around that same time.  He had a left knee joint aspiration and cortisone injection performed on 01/18/2019.  He requested an aspiration cortisone injection today.  He tolerated the procedure well.  Primary osteoarthritis of both hands: He has no tenderness, synovitis, or dactylitis on exam.  He has no hand pain or stiffness at this time.  Joint protection and muscle strengthening were discussed.  Primary osteoarthritis of both feet: He has no feet pain at this time.  He was proper fitting shoes.  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  Moderate alcohol consumption - He discontinued alcohol consumption 2 weeks ago.   Nocturia -He discontinued drinking alcohol 2 weeks ago and  has been experiencing nocturia since then.  He states he has been waking up every 1-2 hours to urinate.  He would like his PSA level checked with his upcoming lab  work.  Future order was placed today plan: PSA  Family history of ankylosing spondylitis - His son is prescribed Humira.   Family history of Wegener's granulomatosis - Brother died from [employer] granulomatosis     Orders: Orders Placed This Encounter  Procedures   Large Joint Inj   PSA   No orders of the defined types were placed in this encounter.   Face-to-face time spent with patient was 30 minutes. Greater than 50% of time was spent in counseling and coordination of care.  Follow-Up Instructions: Return for Psoriatic arthritis.   Ofilia Neas, PA-C   I examined and evaluated the patient with [name] PA.  Patient has noticed improvement on Cosentyx.  Although he is child had pain and swelling on his left knee joint with significant effusion.  Per his request after informed consent was obtained and different side effects were discussed left knee joint was aspirated and injected with cortisone as described above.  He tolerated the procedure well.  We will space his Cosentyx to every 2 weeks instead of monthly.  The plan of care was discussed as noted above.  Bo Merino, MD  Note - This record has been created using Editor, commissioning.  Chart creation errors have been sought, but may not always  have been located. Such creation errors do not reflect on  the standard of medical care.

## 2019-03-28 ENCOUNTER — Encounter: Payer: Self-pay | Admitting: Rheumatology

## 2019-03-28 ENCOUNTER — Other Ambulatory Visit: Payer: Self-pay

## 2019-03-28 ENCOUNTER — Ambulatory Visit: Payer: BC Managed Care – PPO | Admitting: Rheumatology

## 2019-03-28 VITALS — BP 117/67 | HR 67 | Resp 15 | Ht 74.5 in | Wt 286.0 lb

## 2019-03-28 DIAGNOSIS — Z79899 Other long term (current) drug therapy: Secondary | ICD-10-CM | POA: Diagnosis not present

## 2019-03-28 DIAGNOSIS — M19042 Primary osteoarthritis, left hand: Secondary | ICD-10-CM

## 2019-03-28 DIAGNOSIS — L409 Psoriasis, unspecified: Secondary | ICD-10-CM | POA: Diagnosis not present

## 2019-03-28 DIAGNOSIS — M19041 Primary osteoarthritis, right hand: Secondary | ICD-10-CM

## 2019-03-28 DIAGNOSIS — L4050 Arthropathic psoriasis, unspecified: Secondary | ICD-10-CM

## 2019-03-28 DIAGNOSIS — Z832 Family history of diseases of the blood and blood-forming organs and certain disorders involving the immune mechanism: Secondary | ICD-10-CM

## 2019-03-28 DIAGNOSIS — M19071 Primary osteoarthritis, right ankle and foot: Secondary | ICD-10-CM

## 2019-03-28 DIAGNOSIS — Z8269 Family history of other diseases of the musculoskeletal system and connective tissue: Secondary | ICD-10-CM

## 2019-03-28 DIAGNOSIS — M25462 Effusion, left knee: Secondary | ICD-10-CM | POA: Diagnosis not present

## 2019-03-28 DIAGNOSIS — Z789 Other specified health status: Secondary | ICD-10-CM

## 2019-03-28 DIAGNOSIS — M19072 Primary osteoarthritis, left ankle and foot: Secondary | ICD-10-CM

## 2019-03-28 DIAGNOSIS — R351 Nocturia: Secondary | ICD-10-CM

## 2019-03-28 DIAGNOSIS — D809 Immunodeficiency with predominantly antibody defects, unspecified: Secondary | ICD-10-CM

## 2019-03-28 MED ORDER — TRIAMCINOLONE ACETONIDE 40 MG/ML IJ SUSP
40.0000 mg | INTRAMUSCULAR | Status: AC | PRN
  Administered 2019-03-28: 40 mg via INTRA_ARTICULAR

## 2019-03-28 MED ORDER — LIDOCAINE HCL 1 % IJ SOLN
3.0000 mL | INTRAMUSCULAR | Status: AC | PRN
  Administered 2019-03-28: 3 mL

## 2019-04-06 ENCOUNTER — Other Ambulatory Visit: Payer: Self-pay

## 2019-04-06 DIAGNOSIS — R7989 Other specified abnormal findings of blood chemistry: Secondary | ICD-10-CM

## 2019-04-06 DIAGNOSIS — R351 Nocturia: Secondary | ICD-10-CM

## 2019-04-07 LAB — ALT: ALT: 88 U/L — ABNORMAL HIGH (ref 9–46)

## 2019-04-07 LAB — PSA: PSA: 0.4 ng/mL (ref <=4.0)

## 2019-04-07 LAB — AST: AST: 39 U/L — ABNORMAL HIGH (ref 10–35)

## 2019-04-09 NOTE — Progress Notes (Signed)
LFTs are improving.  He should continue to abstain from alcohol and avoid NSAIDs.

## 2019-04-09 NOTE — Progress Notes (Signed)
Please see Dr. Arlean Hopping note above.

## 2019-04-09 NOTE — Progress Notes (Signed)
PSA is WNL

## 2019-04-10 ENCOUNTER — Telehealth: Payer: Self-pay | Admitting: Rheumatology

## 2019-04-10 NOTE — Telephone Encounter (Signed)
Patient called requesting a return call regarding his new instructions on his prescription of Cosentyx.  Patient states he is not sure if he is suppose to take his Cosentyx 300 mg every 7 days or 150 mg every 14 days.

## 2019-04-10 NOTE — Telephone Encounter (Signed)
Spoke with patient and advised him he is to take his Cosentyx 1 150 mg pen every 14 days. Patient verbalized understanding.

## 2019-04-19 ENCOUNTER — Ambulatory Visit: Payer: BC Managed Care – PPO | Admitting: Rheumatology

## 2019-04-27 ENCOUNTER — Ambulatory Visit: Payer: BC Managed Care – PPO | Admitting: Rheumatology

## 2019-04-27 NOTE — Progress Notes (Deleted)
Office Visit Note  Patient: Nathaniel Velazquez             Date of Birth: Jun 17, 1961           MRN: [id number]             PCP: [name], MD Referring: [name], MD Visit Date: 05/11/2019 Occupation: @GUAROCC @  Subjective:  No chief complaint on file.  Cosentyx 150 mg every 14 days started in July 2020.  Last TB gold negative on 09/19/2018 and will monitor yearly.  Most recent CBC/CMP within normal limits except for elevated AST/ALT on 03/05/2019.  Repeat ALT/AST elevated but trending down on 04/06/2019.  History of Present Illness: Nathaniel Velazquez is a 58 y.o. male ***   Activities of Daily Living:  Patient reports morning stiffness for *** {minute/hour:19697}.   Patient {ACTIONS;DENIES/REPORTS:21021675::"Denies"} nocturnal pain.  Difficulty dressing/grooming: {ACTIONS;DENIES/REPORTS:21021675::"Denies"} Difficulty climbing stairs: {ACTIONS;DENIES/REPORTS:21021675::"Denies"} Difficulty getting out of chair: {ACTIONS;DENIES/REPORTS:21021675::"Denies"} Difficulty using hands for taps, buttons, cutlery, and/or writing: {ACTIONS;DENIES/REPORTS:21021675::"Denies"}  No Rheumatology ROS completed.   PMFS History:  Patient Active Problem List   Diagnosis Date Noted  . 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 BMI 38.0-38.9,adult 09/19/2018  . Family history of Wegener's granulomatosis 09/19/2018  . Moderate alcohol consumption 09/19/2018  . Psoriasis 09/19/2018  . Psoriatic arthritis ([place]) 09/19/2018  . Rectal pain 06/05/2015  . Hypertension   . Obesity   . Hyperlipidemia 11/21/2008  . PALPITATIONS 10/30/2008    Past Medical History:  Diagnosis Date  . Arthritis   . GERD (gastroesophageal reflux disease)   . Hypertension   . Obesity    Steroid injection in rt knee    Family History  Problem Relation Age of Onset  . Hypertrophic cardiomyopathy Brother   . Heart disease Mother   . Hypertension Mother   . Heart disease Father   . Hypertension Father    . Obesity Brother   . Heart disease Brother   . Healthy Daughter   . Spondylitis Son   . Autoimmune disease Brother   . Healthy Daughter   . Colon cancer Neg Hx   . Colon polyps Neg Hx   . Esophageal cancer Neg Hx   . Pancreatic cancer Neg Hx   . Stomach cancer Neg Hx   . Rectal cancer Neg Hx    Past Surgical History:  Procedure Laterality Date  . ANAL FISTULECTOMY  1987  . APPENDECTOMY    . KNEE SURGERY Bilateral    meniscal tear repairs  . ROTATOR CUFF REPAIR Bilateral 2018  . 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 Influenza, Quadrivalent, Recombinant, Inj, Pf 06/05/2018  . Influenza,inj,Quad PF,6+ Mos 05/17/2013, 04/10/2014, 04/23/2015, 04/06/2017  . Influenza-Unspecified 05/19/2016, 05/18/2018  . Pneumococcal Polysaccharide-23 12/28/2018  . Td 07/19/1998  . Tdap 04/10/2014     Objective: Vital Signs: There were no vitals taken for this visit.   Physical Exam   Musculoskeletal Exam: ***  CDAI Exam: CDAI Score: - Patient Global: -; Provider Global: - Swollen: -; Tender: - Joint Exam   No joint exam has been documented for this visit   There is currently no information documented on the homunculus. Go to the Rheumatology activity and complete the homunculus joint exam.  Investigation: No additional findings.  Imaging: No results found.  Recent Labs: Lab Results  Component Value Date   WBC 6.9 03/02/2019   HGB 15.5  03/02/2019   PLT 235 03/02/2019   NA 134 (L) 03/12/2019   K 4.6 03/12/2019   CL 104 03/12/2019   CO2 22 03/12/2019   GLUCOSE 278 (H) 03/12/2019   BUN 17 03/12/2019   CREATININE 0.96 03/12/2019   BILITOT 0.7 03/12/2019   ALKPHOS 48 06/12/2018   AST 39 (H) 04/06/2019   ALT 88 (H) 04/06/2019   PROT 7.0 03/12/2019   ALBUMIN 4.6 06/12/2018   CALCIUM 9.4 03/12/2019   GFRAA 100 03/12/2019   QFTBGOLDPLUS NEGATIVE 09/19/2018    Speciality  Comments: No specialty comments available.  Procedures:  No procedures performed Allergies: Gabapentin   Assessment / Plan:     Visit Diagnoses: No diagnosis found.  Orders: No orders of the defined types were placed in this encounter.  No orders of the defined types were placed in this encounter.   Face-to-face time spent with patient was *** minutes. Greater than 50% of time was spent in counseling and coordination of care.  Follow-Up Instructions: No follow-ups on file.   Ofilia Neas, PA-C  Note - This record has been created using Dragon software.  Chart creation errors have been sought, but may not always  have been located. Such creation errors do not reflect on  the standard of medical care.

## 2019-04-30 ENCOUNTER — Ambulatory Visit: Payer: BC Managed Care – PPO | Admitting: Rheumatology

## 2019-05-09 NOTE — Progress Notes (Signed)
Office Visit Note  Patient: Nathaniel Velazquez             Date of Birth: 05-Oct-1960           MRN: [id number]             PCP: [name], MD Referring: [name], MD Visit Date: 05/21/2019 Occupation: @GUAROCC @  Subjective:  Left knee pain and swelling   History of Present Illness: Nathaniel Velazquez is a 58 y.o. male with history of psoriatic arthritis and osteoarthritis. He is on Cosentyx 150 mg sq injections every 14 days (started in July 2020).  He tried spacing the dose of Cosentyx starting in September but has not noticed any improvement.  His psoriasis is also started resurfacing over the past 1 month.  He is having severe left knee joint pain and swelling.  He had a left knee joint aspiration and cortisone injection performed on 03/28/2019 which provided temporary relief for about 3 weeks.  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  He is also been having increased discomfort in the right ankle joint.  He has been experiencing some discomfort in bilateral hips due to gait change.  Denies any groin pain currently. he denies any Achilles tendinitis or plantar fasciitis.  He has been unable to take NSAIDs due to an elevation in LFTs.  He had a good response to prednisone in the past but does not want to keep having to take prednisone tapers on a regular basis.  Activities of Daily Living:  Patient reports joint stiffness all day.  Patient Reports nocturnal pain.  Difficulty dressing/grooming: Reports Difficulty climbing stairs: Reports Difficulty getting out of chair: Reports Difficulty using hands for taps, buttons, cutlery, and/or writing: Reports  Review of Systems  Constitutional: Negative for fatigue and night sweats.  HENT: Negative for mouth sores, mouth dryness and nose dryness.   Eyes: Negative for redness and dryness.  Respiratory: Negative for cough, hemoptysis,  shortness of breath and difficulty breathing.   Cardiovascular: Negative for chest pain, palpitations, hypertension, irregular heartbeat and swelling in legs/feet.  Gastrointestinal: Negative for blood in stool, constipation and diarrhea.  Endocrine: Negative for increased urination.  Genitourinary: Negative for painful urination.  Musculoskeletal: Positive for arthralgias, joint pain, joint swelling, morning stiffness and muscle tenderness. Negative for myalgias, muscle weakness and myalgias.  Skin: Negative for color change, rash, hair loss, nodules/bumps, skin tightness, ulcers and sensitivity to sunlight.  Allergic/Immunologic: Negative for susceptible to infections.  Neurological: Positive for numbness and weakness. Negative for dizziness, fainting, memory loss and night sweats.  Hematological: Negative for bruising/bleeding tendency and swollen glands.  Psychiatric/Behavioral: Positive for sleep disturbance. Negative for depressed mood. The patient is not nervous/anxious.     PMFS History:  Patient Active Problem List   Diagnosis Date Noted  . 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 BMI 38.0-38.9,adult 09/19/2018  . Family history of Wegener's granulomatosis 09/19/2018  . Moderate alcohol consumption 09/19/2018  . Psoriasis 09/19/2018  . Psoriatic arthritis ([place]) 09/19/2018  . Rectal pain 06/05/2015  . Hypertension   . Obesity   . Hyperlipidemia 11/21/2008  . PALPITATIONS 10/30/2008    Past Medical History:  Diagnosis Date  . Arthritis   . GERD (gastroesophageal reflux disease)   . Hypertension   . Obesity    Steroid injection in rt knee    Family History  Problem Relation  Age of Onset  . Hypertrophic cardiomyopathy Brother   . Heart disease Mother   . Hypertension Mother   . Heart disease Father   . Hypertension Father   . Obesity Brother   . Heart disease Brother   . Healthy Daughter   . Spondylitis Son   . Autoimmune disease Brother   . Healthy Daughter    . Colon cancer Neg Hx   . Colon polyps Neg Hx   . Esophageal cancer Neg Hx   . Pancreatic cancer Neg Hx   . Stomach cancer Neg Hx   . Rectal cancer Neg Hx    Past Surgical History:  Procedure Laterality Date  . ANAL FISTULECTOMY  1987  . APPENDECTOMY    . KNEE SURGERY Bilateral    meniscal tear repairs  . ROTATOR CUFF REPAIR Bilateral 2018  . 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 Influenza, Quadrivalent, Recombinant, Inj, Pf 06/05/2018, 04/23/2019  . Influenza,inj,Quad PF,6+ Mos 05/17/2013, 04/10/2014, 04/23/2015, 04/06/2017  . Influenza,inj,quad, With Preservative 03/19/2017  . Influenza-Unspecified 05/19/2016, 05/18/2018, 04/23/2019  . Pneumococcal Polysaccharide-23 12/28/2018  . Td 07/19/1998  . Tdap 04/10/2014     Objective: Vital Signs: BP 123/70 (BP Location: Left Arm, Patient Position: Sitting, Cuff Size: Normal)   Pulse 69   Resp 16   Ht 6\' 2"  (1.88 m)   Wt 283 lb 9.6 oz (128.6 kg)   BMI 36.41 kg/m    Physical Exam Vitals signs and nursing note reviewed.  Constitutional:      Appearance: He is well-developed.  HENT:     Head: Normocephalic and atraumatic.  Eyes:     Conjunctiva/sclera: Conjunctivae normal.     Pupils: Pupils are equal, round, and reactive to light.  Neck:     Musculoskeletal: Normal range of motion and neck supple.  Cardiovascular:     Rate and Rhythm: Normal rate and regular rhythm.     Heart sounds: Normal heart sounds.  Pulmonary:     Effort: Pulmonary effort is normal.     Breath sounds: Normal breath sounds.  Abdominal:     General: Bowel sounds are normal.     Palpations: Abdomen is soft.  Skin:    General: Skin is warm and dry.     Capillary Refill: Capillary refill takes less than 2 seconds.  Neurological:     Mental Status: He is alert and oriented to person, place, and time.  Psychiatric:        Behavior: Behavior  normal.      Musculoskeletal Exam: C-spine, thoracic spine, and lumbar spine good ORM.  No midline spinal tenderness.  No SI joint tenderness.  Shoulder joints, elbow joints, wrist joints, MCPs, PIPs, and DIPs good ROM with no synovitis.  PIP and DIP synovial thickening consistent with osteoarthritis of both hands.   Hip joints good ROM with no discomfort. Moderate effusion of left knee joint.  No warmth or effusion of right knee joint.  Tenderness of the right ankle joint.  No achilles tendonitis or plantar fasciitis.    CDAI Exam: CDAI Score: 3.6  Patient Global: 8 mm; Provider Global: 8 mm Swollen: 2 ; Tender: 2  Joint Exam      Right  Left  Knee     Swollen Tender  Ankle  Swollen Tender        Investigation: No additional findings.  Imaging: No results found.  Recent Labs: Lab Results  Component Value Date   WBC 6.9 03/02/2019   HGB 15.5 03/02/2019   PLT 235 03/02/2019   NA 134 (L) 03/12/2019   K 4.6 03/12/2019   CL 104 03/12/2019   CO2 22 03/12/2019   GLUCOSE 278 (H) 03/12/2019   BUN 17 03/12/2019   CREATININE 0.96 03/12/2019   BILITOT 0.7 03/12/2019   ALKPHOS 48 06/12/2018   AST 39 (H) 04/06/2019   ALT 88 (H) 04/06/2019   PROT 7.0 03/12/2019   ALBUMIN 4.6 06/12/2018   CALCIUM 9.4 03/12/2019   GFRAA 100 03/12/2019   QFTBGOLDPLUS NEGATIVE 09/19/2018    Speciality Comments: No specialty comments available.  Procedures:  No procedures performed Allergies: Gabapentin   Assessment / Plan:     Visit Diagnoses:Psoriatic arthritis [facility]): He presents today with a moderate effusion, warmth, and tenderness of the left knee joint.  He had a left knee joint cortisone injection and aspiration performed on 03/28/2019 which provided significant relief for about 3 weeks.  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  He is having increased pain in both hands, both knee joints, and the right  ankle joint.  He has no Achilles denies or plantar fasciitis.  No SI joint tenderness.  He has not noticed any improvement in his arthralgias since starting on Cosentyx on 01/18/19.  He was initially injecting Cosentyx 300 mg subcutaneously every 28 days but had an inadequate response and had to increase the frequency of Cosentyx to 150 mg every days injections every 14 days starting in September 2020.  He has responded well to prednisone in the past.  We will send in a prescription for prednisone 10 mg tapering by 2.5 mg every week.  We also discussed switching from Cosentyx to Humira 40 mg subcu injections every 14 days.  Indications, contraindications, and potential side effects of Humira were discussed.  All questions were addressed and consent was obtained today.  We will schedule a nurse visit for the administration of the first injection once Humira is approved by his insurance.  He will follow-up in the office in 5 weeks.  Counseled patient that Humira is a TNF blocking agent.  Counseled patient on purpose, proper use, and adverse effects of Humira.  Reviewed the most common adverse effects including infections, headache, and injection site reactions. Discussed that there is the possibility of an increased risk of malignancy but it is not well understood if this increased risk is due to the medication or the disease state.  Advised patient to get yearly dermatology exams due to risk of skin cancer. Counseled patient that Humira should be held prior to scheduled surgery.  Counseled patient to avoid live vaccines while on Humira.  Advised patient to get annual influenza vaccine and the pneumococcal vaccine as indicated.    Reviewed the importance of regular labs while on Humira therapy.  Standing orders placed.  Provided patient with medication education material and answered all questions.  Patient consented to Humira.  Will upload consent into the media tab.  Reviewed storage instructions of Humira.   Advised initial injection must be administered in office.  Patient verbalized understanding.  Dose will be for psoriatic arthritis Humira 40 mg every 14 days.  Prescription pending lab results and/or insurance approval.   Effusion, left knee: He presents today with warmth, tenderness, and a moderate effusion of the left knee joint.  He had a  cortisone injection and aspiration on 03/28/19, which provided significant relief for about 3 weeks.  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  We will be switching him from Cosentyx to Humira.  He will also take a prednisone taper starting at 10 mg tapering by 2.5 mg every week for symptomatic relief.- Plan: predniSONE (DELTASONE) 5 MG tablet  Psoriasis: He has started to notice patches of psoriasis resurfacing.  He has clobetasol at home which he can apply topically as needed.  High risk medication use -He will be starting on Humira 40 mg subcutaneous injections every 14 days.  Nurse visit will be scheduled for the administration of the first injection.  He had an inadequate response to Cosentyx 300 mg every 28 days started in July 2020.  He started spacing the dose of Cosentyx 150 mg subcutaneous injections every 14 days in September 2020 due to an inadequate response to monthly doses.  Last TB gold negative on 09/19/2018 and will monitor yearly.  Most recent CBC within normal limits on 03/02/2019.  Most recent CMP showed elevated liver enzymes on 03/12/2019.  Repeat AST/ALT on 04/06/2019 trending down.  Primary osteoarthritis of both hands: He has PIP and DIP synovial thickening consistent with osteoarthritis of both hands. He has no synovitis or dactylitis on exam.   Primary osteoarthritis of both feet: He has intermittent discomfort in both feet.  He wears proper fitting shoes.   Other medical conditions are listed as follows:   Immunoglobulin deficiency (HCC)  Elevated LFTs  Moderate  alcohol consumption  Family history of ankylosing spondylitis  Family history of Wegener's granulomatosis  Essential hypertension  History of gastroesophageal reflux (GERD)  History of hyperlipidemia  Orders: No orders of the defined types were placed in this encounter.  Meds ordered this encounter  Medications  . predniSONE (DELTASONE) 5 MG tablet    Sig: Take 2 tablets (10 mg total) by mouth daily with breakfast for 7 days, THEN 1.5 tablets (7.5 mg total) daily with breakfast for 7 days, THEN 1 tablet (5 mg total) daily with breakfast for 7 days, THEN 0.5 tablets (2.5 mg total) daily with breakfast for 7 days.    Dispense:  35 tablet    Refill:  0    Face-to-face time spent with patient was 30 minutes. Greater than 50% of time was spent in counseling and coordination of care.  Follow-Up Instructions: Return in 5 weeks (on 06/25/2019) for Psoriatic arthritis, Osteoarthritis, Humira NSFU.   [name] PA-C  I examined and evaluated the patient with [name] PA.  Patient has been on Cosentyx.Marland Kitchen  He has not had a good response to the treatment.  He had recent aspiration and injection of his left knee joint which is again swollen.  We had detailed discussion regarding different treatment options and their side effects.  We had detailed discussion regarding Humira and had side effects.  Patient feels quite comfortable to start on Humira as his son is on Humira.  Indication side effects contraindications were reviewed.  We will proceed with Humira.  He has been advised not to take Cosentyx injections anymore.  He has been advised to do yearly skin examination.  He will come in next Monday to get his first Humira injection.  I will also give him a low-dose prednisone taper due to swelling.  The plan of care was discussed as noted above.  Bo Merino, MD  Note - This record  has been created using [employer].  Chart creation errors have been sought, but may not always  have  been located. Such creation errors do not reflect on  the standard of medical care.

## 2019-05-11 ENCOUNTER — Ambulatory Visit: Payer: BC Managed Care – PPO | Admitting: Rheumatology

## 2019-05-21 ENCOUNTER — Telehealth: Payer: Self-pay | Admitting: Pharmacist

## 2019-05-21 ENCOUNTER — Other Ambulatory Visit: Payer: Self-pay

## 2019-05-21 ENCOUNTER — Encounter: Payer: Self-pay | Admitting: Physician Assistant

## 2019-05-21 ENCOUNTER — Ambulatory Visit: Payer: BC Managed Care – PPO | Admitting: Rheumatology

## 2019-05-21 VITALS — BP 123/70 | HR 69 | Resp 16 | Ht 74.0 in | Wt 283.6 lb

## 2019-05-21 DIAGNOSIS — Z8639 Personal history of other endocrine, nutritional and metabolic disease: Secondary | ICD-10-CM

## 2019-05-21 DIAGNOSIS — D809 Immunodeficiency with predominantly antibody defects, unspecified: Secondary | ICD-10-CM

## 2019-05-21 DIAGNOSIS — L4050 Arthropathic psoriasis, unspecified: Secondary | ICD-10-CM | POA: Diagnosis not present

## 2019-05-21 DIAGNOSIS — M19041 Primary osteoarthritis, right hand: Secondary | ICD-10-CM

## 2019-05-21 DIAGNOSIS — Z832 Family history of diseases of the blood and blood-forming organs and certain disorders involving the immune mechanism: Secondary | ICD-10-CM

## 2019-05-21 DIAGNOSIS — Z8719 Personal history of other diseases of the digestive system: Secondary | ICD-10-CM

## 2019-05-21 DIAGNOSIS — M25462 Effusion, left knee: Secondary | ICD-10-CM

## 2019-05-21 DIAGNOSIS — Z8269 Family history of other diseases of the musculoskeletal system and connective tissue: Secondary | ICD-10-CM

## 2019-05-21 DIAGNOSIS — M19071 Primary osteoarthritis, right ankle and foot: Secondary | ICD-10-CM

## 2019-05-21 DIAGNOSIS — Z79899 Other long term (current) drug therapy: Secondary | ICD-10-CM | POA: Diagnosis not present

## 2019-05-21 DIAGNOSIS — M19042 Primary osteoarthritis, left hand: Secondary | ICD-10-CM

## 2019-05-21 DIAGNOSIS — L409 Psoriasis, unspecified: Secondary | ICD-10-CM

## 2019-05-21 DIAGNOSIS — R7989 Other specified abnormal findings of blood chemistry: Secondary | ICD-10-CM

## 2019-05-21 DIAGNOSIS — I10 Essential (primary) hypertension: Secondary | ICD-10-CM

## 2019-05-21 DIAGNOSIS — M19072 Primary osteoarthritis, left ankle and foot: Secondary | ICD-10-CM

## 2019-05-21 DIAGNOSIS — Z789 Other specified health status: Secondary | ICD-10-CM

## 2019-05-21 MED ORDER — PREDNISONE 5 MG PO TABS: ORAL_TABLET | ORAL | 0 refills | Status: AC

## 2019-05-21 NOTE — Patient Instructions (Signed)
Adalimumab Injection What is this medicine? ADALIMUMAB (a dal AYE mu mab) is used to treat rheumatoid and psoriatic arthritis. It is also used to treat ankylosing spondylitis, Crohn's disease, ulcerative colitis, plaque psoriasis, hidradenitis suppurativa, and uveitis. This medicine may be used for other purposes; ask your health care provider or pharmacist if you have questions. COMMON BRAND NAME(S): CYLTEZO, Humira What should I tell my health care provider before I take this medicine? They need to know if you have any of these conditions:  diabetes  heart disease  hepatitis B or history of hepatitis B infection  immune system problems  infection or history of infections  multiple sclerosis  recently received or scheduled to receive a vaccine  scheduled to have surgery  tuberculosis, a positive skin test for tuberculosis or have recently been in close contact with someone who has tuberculosis  an unusual reaction to adalimumab, other medicines, mannitol, latex, rubber, foods, dyes, or preservatives  pregnant or trying to get pregnant  breast-feeding How should I use this medicine? This medicine is for injection under the skin. You will be taught how to prepare and give this medicine. Use exactly as directed. Take your medicine at regular intervals. Do not take your medicine more often than directed. A special MedGuide will be given to you by the pharmacist with each prescription and refill. Be sure to read this information carefully each time. It is important that you put your used needles and syringes in a special sharps container. Do not put them in a trash can. If you do not have a sharps container, call your pharmacist or healthcare provider to get one. Talk to your pediatrician regarding the use of this medicine in children. While this drug may be prescribed for children as young as 2 years for selected conditions, precautions do apply. The manufacturer of the medicine  offers free information to patients and their health care partners. Call 1-800-448-6472 for more information. Overdosage: If you think you have taken too much of this medicine contact a poison control center or emergency room at once. NOTE: This medicine is only for you. Do not share this medicine with others. What if I miss a dose? If you miss a dose, take it as soon as you can. If it is almost time for your next dose, take only that dose. Do not take double or extra doses. Give the next dose when your next scheduled dose is due. Call your doctor or health care professional if you are not sure how to handle a missed dose. What may interact with this medicine? Do not take this medicine with any of the following medications:  abatacept  anakinra  etanercept  infliximab  live virus vaccines  rilonacept This medicine may also interact with the following medications:  vaccines This list may not describe all possible interactions. Give your health care provider a list of all the medicines, herbs, non-prescription drugs, or dietary supplements you use. Also tell them if you smoke, drink alcohol, or use illegal drugs. Some items may interact with your medicine. What should I watch for while using this medicine? Visit your doctor or health care professional for regular checks on your progress. Tell your doctor or healthcare professional if your symptoms do not start to get better or if they get worse. You will be tested for tuberculosis (TB) before you start this medicine. If your doctor prescribes any medicine for TB, you should start taking the TB medicine before starting this medicine. Make sure to   finish the full course of TB medicine. Call your doctor or health care professional if you get a cold or other infection while receiving this medicine. Do not treat yourself. This medicine may decrease your body's ability to fight infection. Talk to your doctor about your risk of cancer. You may be  more at risk for certain types of cancers if you take this medicine. What side effects may I notice from receiving this medicine? Side effects that you should report to your doctor or health care professional as soon as possible:  allergic reactions like skin rash, itching or hives, swelling of the face, lips, or tongue  breathing problems  changes in vision  chest pain  fever, chills, or any other sign of infection  numbness or tingling  red, scaly patches or raised bumps on the skin  swelling of the ankles  swollen lymph nodes in the neck, underarm, or groin areas  unexplained weight loss  unusual bleeding or bruising  unusually weak or tired Side effects that usually do not require medical attention (report to your doctor or health care professional if they continue or are bothersome):  headache  nausea  redness, itching, swelling, or bruising at site where injected This list may not describe all possible side effects. Call your doctor for medical advice about side effects. You may report side effects to FDA at 1-800-FDA-1088. Where should I keep my medicine? Keep out of the reach of children. Store in the original container and in the refrigerator between 2 and 8 degrees C (36 and 46 degrees F). Do not freeze. The product may be stored in a cool carrier with an ice pack, if needed. Protect from light. Throw away any unused medicine after the expiration date. NOTE: This sheet is a summary. It may not cover all possible information. If you have questions about this medicine, talk to your doctor, pharmacist, or health care provider.  2020 Elsevier/Gold Standard (2018-04-24 13:22:46)  

## 2019-05-21 NOTE — Telephone Encounter (Signed)
Plan is to start Humira 40 mg every 14 days.  He has tried and failed Cosentyx and [place].  He has a past medical history of fatty liver disease, moderate alcohol use, and elevated LFT's making him a poor candidate for methotrexate.   Submitted a Prior Authorization request to [facility] for [place] via Cover My Meds. Will update once we receive a response.  [employer]  PA Case ID: [id number]   Mariella Saa, PharmD, [place], [place] Clinical Specialty Pharmacist [phone]  05/21/2019 3:28 PM

## 2019-05-21 NOTE — Telephone Encounter (Signed)
Received notification from [facility] regarding a prior authorization for [place]. Authorization has been APPROVED from 05/21/2019 to 05/20/2020.   Will send document to scan center.  Authorization # PA # [place] [id number]  His last Cosentyx dose was 10/23.  His new start visit was scheduled for 05/28/2019 at [place], PharmD, [place], [place] Clinical Specialty Pharmacist [phone]  05/21/2019 3:30 PM

## 2019-05-21 NOTE — Progress Notes (Signed)
Pharmacy Note Subjective: Patient presents today to the [facility] to see [name].   Patient seen by the pharmacist for counseling on Humira for psoriatic arthritis and plaque psoriasis.  Prior therapy includes: Cosentyx and Otezla.  He has a past medical history of fatty liver disease, moderate alcohol use, and elevated LFT's making him a poor candidate for methotrexate.    Objective:  CBC    Component Value Date/Time   WBC 6.9 03/02/2019 1515   RBC 4.77 03/02/2019 1515   HGB 15.5 03/02/2019 1515   HCT 44.2 03/02/2019 1515   PLT 235 03/02/2019 1515   MCV 92.7 03/02/2019 1515   MCV 94.4 05/02/2018 1418   MCH 32.5 03/02/2019 1515   MCHC 35.1 03/02/2019 1515   RDW 13.3 03/02/2019 1515   LYMPHSABS 2,063 03/02/2019 1515   EOSABS 311 03/02/2019 1515   BASOSABS 69 03/02/2019 1515     CMP     Component Value Date/Time   NA 134 (L) 03/12/2019 1544   NA 140 06/12/2018 1515   K 4.6 03/12/2019 1544   CL 104 03/12/2019 1544   CO2 22 03/12/2019 1544   GLUCOSE 278 (H) 03/12/2019 1544   BUN 17 03/12/2019 1544   BUN 10 06/12/2018 1515   CREATININE 0.96 03/12/2019 1544   CALCIUM 9.4 03/12/2019 1544   PROT 7.0 03/12/2019 1544   PROT 6.4 06/12/2018 1515   ALBUMIN 4.6 06/12/2018 1515   AST 39 (H) 04/06/2019 1534   ALT 88 (H) 04/06/2019 1534   ALKPHOS 48 06/12/2018 1515   BILITOT 0.7 03/12/2019 1544   BILITOT 1.2 06/12/2018 1515   GFRNONAA 86 03/12/2019 1544   GFRAA 100 03/12/2019 1544      Baseline Immunosuppressant Therapy Labs TB GOLD Quantiferon TB Gold Latest Ref Rng & Units 09/19/2018  Quantiferon TB Gold Plus NEGATIVE NEGATIVE   Hepatitis Panel Hepatitis Latest Ref Rng & Units 09/19/2018  Hep B Surface Ag NON-REACTI NON-REACTIVE  Hep B IgM NON-REACTI NON-REACTIVE  Hep C Ab NEGATIVE -  Hep C Ab NON-REACTI NON-REACTIVE  Hep C Ab NON-REACTI NON-REACTIVE   HIV Lab Results  Component Value Date   HIV NON-REACTIVE 09/19/2018    Immunoglobulins Immunoglobulin Electrophoresis Latest Ref Rng & Units 09/19/2018  IgA  47 - 310 mg/dL <5(L)  IgG 600 - 1,640 mg/dL 1,248  IgM 50 - 300 mg/dL 46(L)   SPEP Serum Protein Electrophoresis Latest Ref Rng & Units 03/12/2019  Total Protein 6.1 - 8.1 g/dL 7.0  Albumin 3.8 - 4.8 g/dL -  Alpha-1 0.2 - 0.3 g/dL -  Alpha-2 0.5 - 0.9 g/dL -  Beta Globulin 0.4 - 0.6 g/dL -  Beta 2 0.2 - 0.5 g/dL -  Gamma Globulin 0.8 - 1.7 g/dL -   G6PD No results found for: G6PDH TPMT No results found for: TPMT   Chest x-ray: no active cardiopulmonary disease  Does patient have diagnosis of heart failure?  No  Assessment/Plan:  Counseled patient that Humira is a TNF blocking agent.  Counseled patient on purpose, proper use, and adverse effects of Humira.  Reviewed the most common adverse effects including infections, headache, and injection site reactions. Discussed that there is the possibility of an increased risk of malignancy but it is not well understood if this increased risk is due to the medication or the disease state.  Advised patient to get yearly dermatology exams due to risk of skin cancer. Counseled patient that Humira should be held prior to scheduled surgery.  Counseled patient  to avoid live vaccines while on Humira.  Advised patient to get annual influenza vaccine and the pneumococcal vaccine as indicated.    Reviewed the importance of regular labs while on Humira therapy.  Standing orders placed.  Provided patient with medication education material and answered all questions.  Patient consented to Humira.  Will upload consent into the media tab.  Reviewed storage instructions of Humira.  Advised initial injection must be administered in office.  Patient verbalized understanding.  Dose will be for psoriatic arthritis Humira 40 mg every 14 days.  Prescription pending insurance approval.  All questions encouraged and answered.  Instructed patient to call with any questions or  concerns.  Mariella Saa, PharmD, [place], [place] Clinical Specialty Pharmacist [phone]  05/21/2019 3:15 PM

## 2019-05-28 ENCOUNTER — Other Ambulatory Visit: Payer: Self-pay

## 2019-05-28 ENCOUNTER — Ambulatory Visit (INDEPENDENT_AMBULATORY_CARE_PROVIDER_SITE_OTHER): Payer: BC Managed Care – PPO | Admitting: Pharmacist

## 2019-05-28 VITALS — BP 135/71 | HR 71

## 2019-05-28 DIAGNOSIS — L4050 Arthropathic psoriasis, unspecified: Secondary | ICD-10-CM

## 2019-05-28 DIAGNOSIS — L409 Psoriasis, unspecified: Secondary | ICD-10-CM | POA: Diagnosis not present

## 2019-05-28 MED ORDER — HUMIRA (2 PEN) 40 MG/0.4ML ~~LOC~~ AJKT: 40.0000 mg | AUTO-INJECTOR | Freq: Once | SUBCUTANEOUS | 0 refills | Status: DC

## 2019-05-28 MED ORDER — HUMIRA (2 PEN) 40 MG/0.4ML ~~LOC~~ AJKT: 40.0000 mg | AUTO-INJECTOR | SUBCUTANEOUS | 0 refills | Status: DC

## 2019-05-28 NOTE — Patient Instructions (Signed)
Standing Labs We placed an order today for your standing lab work.    Please come back and get your standing labs in 1 month and then every 3 months.  We have open lab daily Monday through Thursday from 8:30-12:30 PM and 1:30-4:30 PM and Friday from 8:30-12:30 PM and 1:30-4:00 PM at the office of Dr. Shaili Deveshwar.   You may experience shorter wait times on Monday and Friday afternoons. The office is located at [address], [address], [place], [place] [address] No appointment is necessary.   Labs are drawn by [facility].  You may receive a bill from [facility] for your lab work.  If you wish to have your labs drawn at another location, please call the office 24 hours in advance to send orders.  If you have any questions regarding directions or hours of operation,  please call [phone].   Just as a reminder please drink plenty of water prior to coming for your lab work. Thanks!  Helpful Tips for Injecting To help alleviate pain and injection site reactions consider the following tips:  . Placing something cold (like and ice gel pack or cold water bottle) on the injection site just before cleansing with alcohol may help reduce pain . If you have a localized reaction (redness, mild swelling, warmth, and itching) you can use topical corticosteroids (hydrocortisone cream) or antihistamine (Claritin) to help minimize reaction the day of, the day before, and the day after injecting . Always inject this medication at room temperature (remove from refrigerator 15-20 minutes before injecting; this may help eliminate stinging). . Always rotate your injection sites using inside/outside thigh of both legs, abdomen divided into 4 quadrants (stay away from waist line, and 2" away from navel). . Do not inject into areas where skin is tender, bruised, red, or hard or where there are scars or stretch marks. . Always let alcohol dry on the skin before injecting. . Place a cold, damp towel or small ice  pack on the injection site for 10 or 15 minutes every 1 to 2 hours if it hurts or is swollen.   

## 2019-05-28 NOTE — Progress Notes (Signed)
Pharmacy Note  Subjective:   Patient is being initiated on Humira.  Patient was previously counseled extensively on and consented to initiation of Humira at that time.  Patient presents to clinic today to receive the first dose of Humira.   Last Cosentyx dose was 10/23.  Objective: CMP     Component Value Date/Time   NA 134 (L) 03/12/2019 1544   NA 140 06/12/2018 1515   K 4.6 03/12/2019 1544   CL 104 03/12/2019 1544   CO2 22 03/12/2019 1544   GLUCOSE 278 (H) 03/12/2019 1544   BUN 17 03/12/2019 1544   BUN 10 06/12/2018 1515   CREATININE 0.96 03/12/2019 1544   CALCIUM 9.4 03/12/2019 1544   PROT 7.0 03/12/2019 1544   PROT 6.4 06/12/2018 1515   ALBUMIN 4.6 06/12/2018 1515   AST 39 (H) 04/06/2019 1534   ALT 88 (H) 04/06/2019 1534   ALKPHOS 48 06/12/2018 1515   BILITOT 0.7 03/12/2019 1544   BILITOT 1.2 06/12/2018 1515   GFRNONAA 86 03/12/2019 1544   GFRAA 100 03/12/2019 1544    CBC    Component Value Date/Time   WBC 6.9 03/02/2019 1515   RBC 4.77 03/02/2019 1515   HGB 15.5 03/02/2019 1515   HCT 44.2 03/02/2019 1515   PLT 235 03/02/2019 1515   MCV 92.7 03/02/2019 1515   MCV 94.4 05/02/2018 1418   MCH 32.5 03/02/2019 1515   MCHC 35.1 03/02/2019 1515   RDW 13.3 03/02/2019 1515   LYMPHSABS 2,063 03/02/2019 1515   EOSABS 311 03/02/2019 1515   BASOSABS 69 03/02/2019 1515    Baseline Immunosuppressant Therapy Labs TB GOLD Quantiferon TB Gold Latest Ref Rng & Units 09/19/2018  Quantiferon TB Gold Plus NEGATIVE NEGATIVE   Hepatitis Panel Hepatitis Latest Ref Rng & Units 09/19/2018  Hep B Surface Ag NON-REACTI NON-REACTIVE  Hep B IgM NON-REACTI NON-REACTIVE  Hep C Ab NEGATIVE -  Hep C Ab NON-REACTI NON-REACTIVE  Hep C Ab NON-REACTI NON-REACTIVE   HIV Lab Results  Component Value Date   HIV NON-REACTIVE 09/19/2018   Immunoglobulins Immunoglobulin Electrophoresis Latest Ref Rng & Units 09/19/2018  IgA  47 - 310 mg/dL <5(L)  IgG 600 - 1,640 mg/dL 1,248  IgM 50 - 300  mg/dL 46(L)   SPEP Serum Protein Electrophoresis Latest Ref Rng & Units 03/12/2019  Total Protein 6.1 - 8.1 g/dL 7.0  Albumin 3.8 - 4.8 g/dL -  Alpha-1 0.2 - 0.3 g/dL -  Alpha-2 0.5 - 0.9 g/dL -  Beta Globulin 0.4 - 0.6 g/dL -  Beta 2 0.2 - 0.5 g/dL -  Gamma Globulin 0.8 - 1.7 g/dL -   G6PD No results found for: G6PDH TPMT No results found for: TPMT   Chest x-ray: no active cardiopulmonary disease on 05/02/2018  Patient running a fever or have signs/symptoms of infection? No.  Assessment/Plan:  Demonstrated proper injection technique with Humira demo pen.  Patient able to demonstrate proper injection technique using the teach back method.  Patient self injected in the right anterior thigh with:  Sample Medication: Humira 40 mg/0.33ml NDC: [id number] Lot: [id number] Expiration: 12/2019  Patient tolerated well.  Observed for 30 mins in office for adverse reaction and none noted.   Patient is to return in 1 month for labs.  Standing orders placed. Prescription sent to [facility] per insurance.  He was given a Humira co-pay card while in office.  All questions encouraged and answered.  Instructed patient to call with any further questions or concerns.  Mariella Saa, PharmD, [place], [place] Clinical Specialty Pharmacist [phone]  05/28/2019 11:39 AM

## 2019-06-27 ENCOUNTER — Telehealth: Payer: Self-pay | Admitting: Rheumatology

## 2019-06-27 ENCOUNTER — Other Ambulatory Visit: Payer: Self-pay

## 2019-06-27 DIAGNOSIS — Z79899 Other long term (current) drug therapy: Secondary | ICD-10-CM

## 2019-06-27 NOTE — Telephone Encounter (Signed)
Please release orders for labs to [facility]. Patient going this week. He will postpone rov until after lab results.

## 2019-06-27 NOTE — Telephone Encounter (Signed)
Lab orders released for [facility].  

## 2019-06-28 ENCOUNTER — Other Ambulatory Visit: Payer: Self-pay

## 2019-06-28 ENCOUNTER — Ambulatory Visit: Payer: BC Managed Care – PPO | Admitting: Rheumatology

## 2019-06-28 ENCOUNTER — Ambulatory Visit: Payer: BC Managed Care – PPO | Admitting: Orthopaedic Surgery

## 2019-06-28 ENCOUNTER — Encounter: Payer: Self-pay | Admitting: Orthopaedic Surgery

## 2019-06-28 ENCOUNTER — Other Ambulatory Visit: Payer: Self-pay | Admitting: Orthopedic Surgery

## 2019-06-28 DIAGNOSIS — M25562 Pain in left knee: Secondary | ICD-10-CM

## 2019-06-28 DIAGNOSIS — G8929 Other chronic pain: Secondary | ICD-10-CM | POA: Diagnosis not present

## 2019-06-28 MED ORDER — LIDOCAINE HCL 1 % IJ SOLN
2.0000 mL | INTRAMUSCULAR | Status: AC | PRN
  Administered 2019-06-28: 2 mL

## 2019-06-28 MED ORDER — BUPIVACAINE HCL 0.5 % IJ SOLN
2.0000 mL | INTRAMUSCULAR | Status: AC | PRN
  Administered 2019-06-28: 2 mL via INTRA_ARTICULAR

## 2019-06-28 MED ORDER — METHYLPREDNISOLONE ACETATE 40 MG/ML IJ SUSP
80.0000 mg | INTRAMUSCULAR | Status: AC | PRN
  Administered 2019-06-28: 80 mg via INTRA_ARTICULAR

## 2019-06-28 NOTE — Progress Notes (Signed)
Office Visit Note   Patient: Nathaniel Velazquez           Date of Birth: 02/15/1961           MRN: [id number] Visit Date: 06/28/2019              Requested by: [name], MD [address] [place],  [place] [address] PCP: [name], MD   Assessment & Plan: Visit Diagnoses:  1. Chronic pain of left knee     Plan: [name] is a patient of [name] with a history of psoriatic arthritis.  Over the last number of months he has had recurrent effusion and pain of his left knee treated with aspiration.  He has been on a recent course of Humira but not sure it is making much of a difference in terms of his knee problem.  He came to the office today as Dr.Deveshwar's office is just doing telehealth.  He has had recurrent pain in his knee with effusion.  I plan to aspirate the knee injected with cortisone.  We will also obtain some lab work that have been ordered by [name]  and have him return to see her as needed  Follow-Up Instructions: Return if symptoms worsen or fail to improve.   Orders:  Orders Placed This Encounter  Procedures  . Large Joint Inj: L knee   No orders of the defined types were placed in this encounter.     Procedures: Large Joint Inj: L knee on 06/28/2019 11:46 AM Indications: pain and diagnostic evaluation Details: 25 G 1.5 in needle, anteromedial approach  Arthrogram: No  Medications: 2 mL lidocaine 1 %; 2 mL bupivacaine 0.5 %; 80 mg methylPREDNISolone acetate 40 MG/ML Aspirate: 30 mL blood-tinged Outcome: tolerated well, no immediate complications Procedure, treatment alternatives, risks and benefits explained, specific risks discussed. Consent was given by the patient. Patient was prepped and draped in the usual sterile fashion.       Clinical Data: No additional findings.   Subjective: Chief Complaint  Patient presents with  . Left Knee - Pain  Patient presents today for left knee pain. His knee has been hurting for  over a year. No known injury. He usually sees Dr.Deveswhar but was unable to get into her office today. His knee hurts all over, but more so anteriorly. Getting up out of a chair and using stairs causes more pain. He states that his knee swells. No grinding, popping, or catching. It feels like it may give way, but has not. He cannot take NSAIDS due to his liver.  No related fever or chills.  Presently on a Humira regimen for the psoriatic arthritis.  HPI  Review of Systems  Constitutional: Negative for fatigue.  HENT: Negative for ear pain.   Eyes: Negative for pain.  Respiratory: Negative for shortness of breath.   Cardiovascular: Negative for leg swelling.  Gastrointestinal: Negative for constipation and diarrhea.  Endocrine: Negative for cold intolerance and heat intolerance.  Genitourinary: Negative for difficulty urinating.  Musculoskeletal: Positive for joint swelling.  Skin: Negative for rash.  Neurological: Negative for weakness.  Hematological: Does not bruise/bleed easily.  Psychiatric/Behavioral: Negative for sleep disturbance.     Objective: Vital Signs: Ht 6\' 2"  (1.88 m)   Wt 283 lb (128.4 kg)   BMI 36.34 kg/m   Physical Exam Constitutional:      Appearance: He is well-developed.  Eyes:     Pupils: Pupils are equal, round, and reactive to  light.  Pulmonary:     Effort: Pulmonary effort is normal.  Skin:    General: Skin is warm and dry.  Neurological:     Mental Status: He is alert and oriented to person, place, and time.  Psychiatric:        Behavior: Behavior normal.     Ortho Exam awake alert and oriented x3.  Comfortable sitting positive effusion left knee.  The knee was not particularly warm or hot.  Some medial lateral joint tenderness.  Full extension flexed over 100 degrees without instability.  No popliteal pain or mass.  No calf pain.  No distal edema.  Neurologically intact.  Specialty Comments:  No specialty comments available.  Imaging: No  results found.   PMFS History: Patient Active Problem List   Diagnosis Date Noted  . Pain in left knee 06/28/2019  . 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 BMI 38.0-38.9,adult 09/19/2018  . Family history of Wegener's granulomatosis 09/19/2018  . Moderate alcohol consumption 09/19/2018  . Psoriasis 09/19/2018  . Psoriatic arthritis ([place]) 09/19/2018  . Rectal pain 06/05/2015  . Hypertension   . Obesity   . Hyperlipidemia 11/21/2008  . PALPITATIONS 10/30/2008   Past Medical History:  Diagnosis Date  . Arthritis   . GERD (gastroesophageal reflux disease)   . Hypertension   . Obesity    Steroid injection in rt knee    Family History  Problem Relation Age of Onset  . Hypertrophic cardiomyopathy Brother   . Heart disease Mother   . Hypertension Mother   . Heart disease Father   . Hypertension Father   . Obesity Brother   . Heart disease Brother   . Healthy Daughter   . Spondylitis Son   . Autoimmune disease Brother   . Healthy Daughter   . Colon cancer Neg Hx   . Colon polyps Neg Hx   . Esophageal cancer Neg Hx   . Pancreatic cancer Neg Hx   . Stomach cancer Neg Hx   . Rectal cancer Neg Hx     Past Surgical History:  Procedure Laterality Date  . ANAL FISTULECTOMY  1987  . APPENDECTOMY    . KNEE SURGERY Bilateral    meniscal tear repairs  . ROTATOR CUFF REPAIR Bilateral 2018  . TONSILLECTOMY     Social History   Occupational History  . Not on file  Tobacco Use  . Smoking status: Never Smoker  . Smokeless tobacco: Never Used  Substance and Sexual Activity  . Alcohol use: Yes    Comment: occ  . Drug use: No  . Sexual activity: Never

## 2019-06-29 ENCOUNTER — Ambulatory Visit: Payer: BC Managed Care – PPO | Admitting: Orthopedic Surgery

## 2019-06-29 LAB — CBC WITH DIFFERENTIAL/PLATELET
Absolute Monocytes: 844 cells/uL (ref 200–950)
Basophils Absolute: 47 cells/uL (ref 0–200)
Basophils Relative: 0.7 %
Eosinophils Absolute: 476 cells/uL (ref 15–500)
Eosinophils Relative: 7.1 %
HCT: 46.1 % (ref 38.5–50.0)
Hemoglobin: 15.9 g/dL (ref 13.2–17.1)
Lymphs Abs: 1943 cells/uL (ref 850–3900)
MCH: 31.9 pg (ref 27.0–33.0)
MCHC: 34.5 g/dL (ref 32.0–36.0)
MCV: 92.4 fL (ref 80.0–100.0)
MPV: 10.1 fL (ref 7.5–12.5)
Monocytes Relative: 12.6 %
Neutro Abs: 3390 cells/uL (ref 1500–7800)
Neutrophils Relative %: 50.6 %
Platelets: 229 10*3/uL (ref 140–400)
RBC: 4.99 10*6/uL (ref 4.20–5.80)
RDW: 12.7 % (ref 11.0–15.0)
Total Lymphocyte: 29 %
WBC: 6.7 10*3/uL (ref 3.8–10.8)

## 2019-06-29 LAB — COMPLETE METABOLIC PANEL WITH GFR
AG Ratio: 1.8 (calc) (ref 1.0–2.5)
ALT: 45 U/L (ref 9–46)
AST: 28 U/L (ref 10–35)
Albumin: 4.2 g/dL (ref 3.6–5.1)
Alkaline phosphatase (APISO): 60 U/L (ref 35–144)
BUN: 13 mg/dL (ref 7–25)
CO2: 25 mmol/L (ref 20–32)
Calcium: 9.6 mg/dL (ref 8.6–10.3)
Chloride: 101 mmol/L (ref 98–110)
Creat: 0.93 mg/dL (ref 0.70–1.33)
GFR, Est African American: 104 mL/min/{1.73_m2} (ref >=60)
GFR, Est Non African American: 90 mL/min/{1.73_m2} (ref >=60)
Globulin: 2.4 g/dL (calc) (ref 1.9–3.7)
Glucose, Bld: 147 mg/dL — ABNORMAL HIGH (ref 65–99)
Potassium: 4.5 mmol/L (ref 3.5–5.3)
Sodium: 134 mmol/L — ABNORMAL LOW (ref 135–146)
Total Bilirubin: 0.6 mg/dL (ref 0.2–1.2)
Total Protein: 6.6 g/dL (ref 6.1–8.1)

## 2019-08-03 ENCOUNTER — Other Ambulatory Visit: Payer: Self-pay | Admitting: Rheumatology

## 2019-08-03 NOTE — Telephone Encounter (Signed)
Last Visit: 05/21/19 Next Visit: 08/08/19 Labs: 06/27/19 Glucose is elevated-147. Sodium is borderline low but stable. Rest of CMP WNL. CBC WNL TB Gold: 09/19/18 Neg   Okay to refill per [name]

## 2019-08-06 ENCOUNTER — Telehealth: Payer: Self-pay | Admitting: Rheumatology

## 2019-08-06 NOTE — Progress Notes (Signed)
Virtual Visit via Video Note  I connected with Nathaniel Velazquez on 08/06/19 at  1:00 PM EST by a video enabled telemedicine application and verified that I am speaking with the correct person using two identifiers.  Location: Patient: Home  Provider: Clinic  This service was conducted via virtual visit.  Both audio and visual tools were used.  The patient was located at home. I was located in my office.  Consent was obtained prior to the virtual visit and is aware of possible charges through their insurance for this visit.  The patient is an established patient.  [name], MD conducted the virtual visit and [name], PA-C acted as scribe during the service.  Office staff helped with scheduling follow up visits after the service was conducted.     I discussed the limitations of evaluation and management by telemedicine and the availability of in person appointments. The patient expressed understanding and agreed to proceed.  CC: Discuss medications  History of Present Illness: Nathaniel Velazquez is a 59 y.o. male with history of psoriatic arthritis and osteoarthritis.  He is on Humira 40 mg sq injections every 14 days.  He was started on Humira on 05/28/19.  His most recent injection was on 08/06/19.  He previously had an inadequate response to Cosentyx and [place]  He continues to have recurrent left knee joint pain and effusions.  He had a left knee joint aspiration and cortisone injection on 06/28/19 by Dr. Durward Fortes.  He has been experiencing increased pain in both hands and both hip joints. He is having stiffness in both hip joints making it difficult to walk.   Review of Systems  Constitutional: Negative for fever and malaise/fatigue.  HENT: Negative for congestion.   Eyes: Negative for photophobia, pain, discharge and redness.  Respiratory: Negative for cough, shortness of breath and wheezing.   Cardiovascular: Negative for chest pain, palpitations and leg swelling.   Gastrointestinal: Negative for blood in stool, constipation and diarrhea.  Genitourinary: Negative for dysuria and frequency.  Musculoskeletal: Positive for joint pain. Negative for back pain, myalgias and neck pain.       +Joint swelling +Joint stiffness   Skin: Negative for rash.  Neurological: Negative for dizziness and headaches.  Endo/Heme/Allergies: Does not bruise/bleed easily.  Psychiatric/Behavioral: Negative for depression and memory loss. The patient is not nervous/anxious and does not have insomnia.      Observations/Objective:  Physical Exam  Constitutional: He is oriented to person, place, and time and well-developed, well-nourished, and in no distress.  HENT:  Head: Normocephalic and atraumatic.  Eyes: Conjunctivae are normal.  Pulmonary/Chest: Effort normal.  Neurological: He is alert and oriented to person, place, and time.  Psychiatric: Mood, memory, affect and judgment normal.     Patient reports joint stiffness all day Patient reports nocturnal pain.  Difficulty dressing/grooming: Reports Difficulty climbing stairs: Denies Difficulty getting out of chair: Reports Difficulty using hands for taps, buttons, cutlery, and/or writing: Reports  Assessment and Plan: Visit Diagnoses:Psoriatic arthritis [facility]): He continues to have recurrent flares with persistent joint pain and inflammation.  He has recurrent left knee joint effusions, and his most recent left knee joint aspiration and cortisone injection were on 06/28/19 performed by Dr. Durward Fortes.  He has started having increased pain and stiffness in both hands and both hip joints.  He was started on Humira on 05/28/19 and has been injecting Humira 40 mg sq injections once every 14 days.  He previously had an inadequate response to [place]  followed by Cosentyx.  We discussed starting him on combination therapy with MTX or Arava, but due to his history of elevated LFTs he would like to hold off. It is uncertain if this  elevation was due to NSAID use or alcohol use. LFTs were WNL on 06/27/19.  He was advised to continue to avoid NSAIDs, tylenol, and alcohol use.  Indications, contraindications, and potential side effects of Arava were discussed. All questions were addressed, but he does not want to start on [place] at this time.  He will come by the office for a handout of information about Arava to review in case he wants to try it in the future  We discussed increasing the frequency of Humira injections to once weekly for 2 months and he is in agreement. We will provide 4 samples to the patient.  We will send in a prednisone taper starting at 20 mg tapering by 5 mg every 4 days.  He will follow up in 2 months.    Effusion, left knee: Recurrent.  He had a left knee joint aspiration and cortisone injection performed by Dr. Durward Fortes on 06/28/19. He continues to have persistent pain and inflammation in the left knee joint.   Psoriasis: He has occasional patches of psoriasis but overall it has improved. He uses clobetasol  topically as needed.  High risk medication use - Humira 40 mg subcutaneous injections every week for 2 months then resume injections every 14 days. (started on 05/28/19).  He had an inadequate response to Cosentyx 300 mg every 28 days started in July 2020. Last TB gold negative on 09/19/2018 and will monitor yearly. CBC and CMP were drawn on 06/27/19.  He will require lab work in 1 month. He was encouraged to receive the covid-19 vaccine once it is available to him.  Primary osteoarthritis of both hands: He has been having increased pain and stiffness in both hands recently.   Primary osteoarthritis of both feet: He is not having any feet pain or inflammation at this time.  He has no achilles tendonitis or plantar fasciitis at this time.  Pain of both hip joints: He has been experiencing increased pain and stiffness in both hip joints.   Elevated LFTs: Uncertain if due to alcohol or NSAID use.  LFTs  WNL on 06/27/19.   Moderate alcohol consumption: He has limited his alcohol consumption but he continues to drink on a regular basis.    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 The patient was provided an opportunity to ask questions and all were answered. The patient agreed with the plan and demonstrated an understanding of the instructions.   The patient was advised to call back or seek an in-person evaluation if the symptoms worsen or if the condition fails to improve as anticipated.  I provided 30 minutes of non-face-to-face time during this encounter.  Bo Merino, MD  Scribed byLovena Le Quinterrius Errington,PA-C

## 2019-08-06 NOTE — Telephone Encounter (Signed)
Patient called stating he is due to take his Humira injection today, but is experiencing "extreme pain" in his back and hips and can barely walk.  Patient is not sure if he should take it or wait until he has his virtual appointment with [name] on Wednesday, 08/08/19.  Patient requested a return call.

## 2019-08-06 NOTE — Telephone Encounter (Signed)
He should continue Humira.  We will have to add something else to Humira to control his arthritis.  Please advise him not to drink alcohol or take any NSAIDs.  His last LFTs in December were normal.  He has appointment on January 20.

## 2019-08-06 NOTE — Telephone Encounter (Signed)
Patient advised he should continue Humira.  Patient advised we will have to add something else to Humira to control his arthritis.  Patient advised  not to drink alcohol or take any NSAIDs.  His last LFTs in December were normal.  Patient verbalized understanding.

## 2019-08-06 NOTE — Telephone Encounter (Signed)
Patient states he is due to take his sixth Humira injection today. Patient states he has been having increased pelvic pain. Patient states it is painful to get to out of a chair or off the toilet. Patient states he "feels like he has been doing leg lifts with 25 lb weights." Patient states he can't walk more than 1/2 a mile. Patient states he can't bend to pick anything up. Patient would like to know if he should continue to take the Humira today. Would like to know if there is something that can be added to the Humira to help. Please advise.

## 2019-08-07 ENCOUNTER — Ambulatory Visit: Payer: BC Managed Care – PPO | Admitting: Rheumatology

## 2019-08-08 ENCOUNTER — Telehealth (INDEPENDENT_AMBULATORY_CARE_PROVIDER_SITE_OTHER): Payer: BC Managed Care – PPO | Admitting: Rheumatology

## 2019-08-08 ENCOUNTER — Other Ambulatory Visit: Payer: Self-pay

## 2019-08-08 ENCOUNTER — Encounter: Payer: Self-pay | Admitting: Rheumatology

## 2019-08-08 ENCOUNTER — Telehealth: Payer: Self-pay | Admitting: *Deleted

## 2019-08-08 DIAGNOSIS — M25462 Effusion, left knee: Secondary | ICD-10-CM | POA: Diagnosis not present

## 2019-08-08 DIAGNOSIS — M19041 Primary osteoarthritis, right hand: Secondary | ICD-10-CM

## 2019-08-08 DIAGNOSIS — L4050 Arthropathic psoriasis, unspecified: Secondary | ICD-10-CM

## 2019-08-08 DIAGNOSIS — D809 Immunodeficiency with predominantly antibody defects, unspecified: Secondary | ICD-10-CM

## 2019-08-08 DIAGNOSIS — M19072 Primary osteoarthritis, left ankle and foot: Secondary | ICD-10-CM

## 2019-08-08 DIAGNOSIS — M25551 Pain in right hip: Secondary | ICD-10-CM

## 2019-08-08 DIAGNOSIS — I10 Essential (primary) hypertension: Secondary | ICD-10-CM

## 2019-08-08 DIAGNOSIS — M19071 Primary osteoarthritis, right ankle and foot: Secondary | ICD-10-CM

## 2019-08-08 DIAGNOSIS — Z8719 Personal history of other diseases of the digestive system: Secondary | ICD-10-CM

## 2019-08-08 DIAGNOSIS — R7989 Other specified abnormal findings of blood chemistry: Secondary | ICD-10-CM

## 2019-08-08 DIAGNOSIS — M19042 Primary osteoarthritis, left hand: Secondary | ICD-10-CM

## 2019-08-08 DIAGNOSIS — Z79899 Other long term (current) drug therapy: Secondary | ICD-10-CM | POA: Diagnosis not present

## 2019-08-08 DIAGNOSIS — Z8269 Family history of other diseases of the musculoskeletal system and connective tissue: Secondary | ICD-10-CM

## 2019-08-08 DIAGNOSIS — L409 Psoriasis, unspecified: Secondary | ICD-10-CM | POA: Diagnosis not present

## 2019-08-08 DIAGNOSIS — Z8639 Personal history of other endocrine, nutritional and metabolic disease: Secondary | ICD-10-CM

## 2019-08-08 DIAGNOSIS — Z789 Other specified health status: Secondary | ICD-10-CM

## 2019-08-08 DIAGNOSIS — M25552 Pain in left hip: Secondary | ICD-10-CM

## 2019-08-08 DIAGNOSIS — Z832 Family history of diseases of the blood and blood-forming organs and certain disorders involving the immune mechanism: Secondary | ICD-10-CM

## 2019-08-08 MED ORDER — PREDNISONE 5 MG PO TABS: ORAL_TABLET | ORAL | 0 refills | Status: DC

## 2019-08-08 NOTE — Addendum Note (Signed)
Addended by: Shona Needles on: 08/08/2019 02:20 PM   Modules accepted: Orders

## 2019-08-08 NOTE — Patient Instructions (Signed)
Leflunomide tablets What is this medicine? LEFLUNOMIDE (le FLOO na mide) is for rheumatoid arthritis. This medicine may be used for other purposes; ask your health care provider or pharmacist if you have questions. COMMON BRAND NAME(S): Arava What should I tell my health care provider before I take this medicine? They need to know if you have any of these conditions:  diabetes  have a fever or infection  high blood pressure  immune system problems  kidney disease  liver disease  low blood cell counts, like low white cell, platelet, or red cell counts  lung or breathing disease, like asthma  recently received or scheduled to receive a vaccine  receiving treatment for cancer  skin conditions or sensitivity  tingling of the fingers or toes, or other nerve disorder  tuberculosis  an unusual or allergic reaction to leflunomide, teriflunomide, other medicines, food, dyes, or preservatives  pregnant or trying to get pregnant  breast-feeding How should I use this medicine? Take this medicine by mouth with a full glass of water. Follow the directions on the prescription label. Take your medicine at regular intervals. Do not take your medicine more often than directed. Do not stop taking except on your doctor's advice. Talk to your pediatrician regarding the use of this medicine in children. Special care may be needed. Overdosage: If you think you have taken too much of this medicine contact a poison control center or emergency room at once. NOTE: This medicine is only for you. Do not share this medicine with others. What if I miss a dose? If you miss a dose, take it as soon as you can. If it is almost time for your next dose, take only that dose. Do not take double or extra doses. What may interact with this medicine? Do not take this medicine with any of the following medications:  teriflunomide This medicine may also interact with the following  medications:  alosetron  birth control pills  caffeine  cefaclor  certain medicines for diabetes like nateglinide, repaglinide, rosiglitazone, pioglitazone  certain medicines for high cholesterol like atorvastatin, pravastatin, rosuvastatin, simvastatin  charcoal  cholestyramine  ciprofloxacin  duloxetine  furosemide  ketoprofen  live virus vaccines  medicines that increase your risk for infection  methotrexate  mitoxantrone  paclitaxel  penicillin  theophylline  tizanidine  warfarin This list may not describe all possible interactions. Give your health care provider a list of all the medicines, herbs, non-prescription drugs, or dietary supplements you use. Also tell them if you smoke, drink alcohol, or use illegal drugs. Some items may interact with your medicine. What should I watch for while using this medicine? Visit your health care provider for regular checks on your progress. Tell your doctor or health care provider if your symptoms do not start to get better or if they get worse. You may need blood work done while you are taking this medicine. This medicine may cause serious skin reactions. They can happen weeks to months after starting the medicine. Contact your health care provider right away if you notice fevers or flu-like symptoms with a rash. The rash may be red or purple and then turn into blisters or peeling of the skin. Or, you might notice a red rash with swelling of the face, lips or lymph nodes in your neck or under your arms. This medicine may stay in your body for up to 2 years after your last dose. Tell your doctor about any unusual side effects or symptoms. A medicine can  be given to help lower your blood levels of this medicine more quickly. Women must use effective birth control with this medicine. There is a potential for serious side effects to an unborn child. Do not become pregnant while taking this medicine. Inform your doctor if you wish  to become pregnant. This medicine remains in your blood after you stop taking it. You must continue using effective birth control until the blood levels have been checked and they are low enough. A medicine can be given to help lower your blood levels of this medicine more quickly. Immediately talk to your doctor if you think you may be pregnant. You may need a pregnancy test. Talk to your health care provider or pharmacist for more information. You should not receive certain vaccines during your treatment and for a certain time after your treatment with this medication ends. Talk to your health care provider for more information. What side effects may I notice from receiving this medicine? Side effects that you should report to your doctor or health care professional as soon as possible:  allergic reactions like skin rash, itching or hives, swelling of the face, lips, or tongue  breathing problems  cough  increased blood pressure  low blood counts - this medicine may decrease the number of white blood cells and platelets. You may be at increased risk for infections and bleeding.  pain, tingling, numbness in the hands or feet  rash, fever, and swollen lymph nodes  redness, blistering, peeing or loosening of the skin, including inside the mouth  signs of decreased platelets or bleeding - bruising, pinpoint red spots on the skin, black, tarry stools, blood in urine  signs of infection - fever or chills, cough, sore throat, pain or trouble passing urine  signs and symptoms of liver injury like dark yellow or brown urine; general ill feeling or flu-like symptoms; light-colored stools; loss of appetite; nausea; right upper belly pain; unusually weak or tired; yellowing of the eyes or skin  trouble passing urine or change in the amount of urine  vomiting Side effects that usually do not require medical attention (report to your doctor or health care professional if they continue or are  bothersome):  diarrhea  hair thinning or loss  headache  nausea  tiredness This list may not describe all possible side effects. Call your doctor for medical advice about side effects. You may report side effects to FDA at 1-800-FDA-1088. Where should I keep my medicine? Keep out of the reach of children. Store at room temperature between 15 and 30 degrees C (59 and 86 degrees F). Protect from moisture and light. Throw away any unused medicine after the expiration date. NOTE: This sheet is a summary. It may not cover all possible information. If you have questions about this medicine, talk to your doctor, pharmacist, or health care provider.  2020 Elsevier/Gold Standard (2018-10-06 15:06:48)  

## 2019-08-08 NOTE — Telephone Encounter (Signed)
Medication Samples have been provided to the patient.  Drug name: Humira     Strength: 40 mg     Qty: 4 LOT: [id number]  Exp.Date: 12/2019  Dosing instructions: Inject 1 pen into skin once weekly for 2 months.   The patient has been instructed regarding the correct time, dose, and frequency of taking this medication, including desired effects and most common side effects.   Gwenlyn Perking 2:32 PM 08/08/2019

## 2019-08-09 ENCOUNTER — Ambulatory Visit: Payer: BC Managed Care – PPO | Admitting: Rheumatology

## 2019-08-17 ENCOUNTER — Ambulatory Visit: Payer: BC Managed Care – PPO | Admitting: Family Medicine

## 2019-08-30 ENCOUNTER — Encounter: Payer: Self-pay | Admitting: Rheumatology

## 2019-08-30 NOTE — Telephone Encounter (Signed)
I had detailed discussion with the patient.  We discussed giving Humira more time.  He has not been on Humira for even a month or so far.  He may apply ice after walking.  We also discussed possible combination of leflunomide with Humira at the follow-up visit.

## 2019-08-31 ENCOUNTER — Ambulatory Visit: Payer: BC Managed Care – PPO

## 2019-09-17 ENCOUNTER — Other Ambulatory Visit: Payer: Self-pay | Admitting: Family Medicine

## 2019-09-17 DIAGNOSIS — I10 Essential (primary) hypertension: Secondary | ICD-10-CM

## 2019-09-21 NOTE — Progress Notes (Signed)
Office Visit Note  Patient: Nathaniel Velazquez             Date of Birth: 1961-06-24           MRN: [id number]             PCP: [name], MD Referring: [name], MD Visit Date: 09/27/2019 Occupation: @GUAROCC @  Subjective:  Discuss medications   History of Present Illness: Nathaniel Velazquez is a 59 y.o. male with history of psoriatic arthritis and osteoarthritis.  Patient was originally started on Humira on 06/11/2019 and was injecting every 14 days.  He started injecting Humira on a weekly basis starting on 08/13/2019 (7 weeks).  He has not noticed much improvement since increasing the frequency of Humira.  He continues to have significant stiffness and discomfort in both shoulder joints, both hands, both hips, and the left knee joint.  The left knee joint remains swollen.  He has been walking 20 to 30 minutes on a daily basis and reports that some days are more difficult than others.  He denies any Achilles tendinitis or plantar fasciitis.  He has occasional SI joint pain bilaterally.  He states that he received his first COVID-19 vaccination yesterday and is having increased arthralgias and stiffness today.   Activities of Daily Living:  Patient reports morning stiffness for several hours.   Patient Reports nocturnal pain.  Difficulty dressing/grooming: Reports Difficulty climbing stairs: Reports Difficulty getting out of chair: Reports Difficulty using hands for taps, buttons, cutlery, and/or writing: Reports  Review of Systems  Constitutional: Negative for fatigue and night sweats.  HENT: Negative for mouth sores, mouth dryness and nose dryness.   Eyes: Negative for redness, itching and dryness.  Respiratory: Negative for cough, hemoptysis, shortness of breath, wheezing and difficulty breathing.   Cardiovascular: Negative for chest pain, palpitations, hypertension, irregular heartbeat and swelling in legs/feet.  Gastrointestinal: Negative for blood in stool,  constipation and diarrhea.  Endocrine: Negative for increased urination.  Genitourinary: Negative for difficulty urinating and painful urination.  Musculoskeletal: Positive for arthralgias, joint pain, joint swelling and morning stiffness. Negative for myalgias, muscle weakness, muscle tenderness and myalgias.  Skin: Negative for color change, rash, hair loss, nodules/bumps, skin tightness, ulcers and sensitivity to sunlight.  Allergic/Immunologic: Negative for susceptible to infections.  Neurological: Positive for numbness. Negative for dizziness, fainting, headaches, memory loss, night sweats and weakness.  Hematological: Negative for bruising/bleeding tendency and swollen glands.  Psychiatric/Behavioral: Positive for sleep disturbance. Negative for depressed mood and confusion. The patient is not nervous/anxious.     PMFS History:  Patient Active Problem List   Diagnosis Date Noted  . Pain in left knee 06/28/2019  . 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 BMI 38.0-38.9,adult 09/19/2018  . Family history of Wegener's granulomatosis 09/19/2018  . Moderate alcohol consumption 09/19/2018  . Psoriasis 09/19/2018  . Psoriatic arthritis ([place]) 09/19/2018  . Rectal pain 06/05/2015  . Hypertension   . Obesity   . Hyperlipidemia 11/21/2008  . PALPITATIONS 10/30/2008    Past Medical History:  Diagnosis Date  . Arthritis   . GERD (gastroesophageal reflux disease)   . Hypertension   . Obesity    Steroid injection in rt knee    Family History  Problem Relation Age of Onset  . Hypertrophic cardiomyopathy Brother   . Heart disease Mother   . Hypertension Mother   . Heart disease Father   . Hypertension Father   . Obesity Brother   . Heart disease Brother   .  Healthy Daughter   . Spondylitis Son   . Autoimmune disease Brother   . Healthy Daughter   . Colon cancer Neg Hx   . Colon polyps Neg Hx   . Esophageal cancer Neg Hx   . Pancreatic cancer Neg Hx   . Stomach cancer Neg  Hx   . Rectal cancer Neg Hx    Past Surgical History:  Procedure Laterality Date  . ANAL FISTULECTOMY  1987  . APPENDECTOMY    . KNEE SURGERY Bilateral    meniscal tear repairs  . ROTATOR CUFF REPAIR Bilateral 2018  . 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 Influenza, Quadrivalent, Recombinant, Inj, Pf 06/05/2018, 04/23/2019  . Influenza,inj,Quad PF,6+ Mos 05/17/2013, 04/10/2014, 04/23/2015, 04/06/2017  . Influenza,inj,quad, With Preservative 03/19/2017  . Influenza-Unspecified 05/19/2016, 05/18/2018, 04/23/2019  . Pneumococcal Polysaccharide-23 12/28/2018  . Td 07/19/1998  . Tdap 04/10/2014     Objective: Vital Signs: BP 122/77 (BP Location: Left Arm, Patient Position: Sitting, Cuff Size: Normal)   Pulse 68   Resp 15   Ht 6\' 3"  (1.905 m)   Wt 276 lb 12.8 oz (125.6 kg)   BMI 34.60 kg/m    Physical Exam Vitals and nursing note reviewed.  Constitutional:      Appearance: He is well-developed.  HENT:     Head: Normocephalic and atraumatic.  Eyes:     Conjunctiva/sclera: Conjunctivae normal.     Pupils: Pupils are equal, round, and reactive to light.  Pulmonary:     Effort: Pulmonary effort is normal.  Abdominal:     General: Bowel sounds are normal.     Palpations: Abdomen is soft.  Musculoskeletal:     Cervical back: Normal range of motion and neck supple.  Skin:    General: Skin is warm and dry.     Capillary Refill: Capillary refill takes less than 2 seconds.  Neurological:     Mental Status: He is alert and oriented to person, place, and time.  Psychiatric:        Behavior: Behavior normal.      Musculoskeletal Exam: C-spine, thoracic spine, lumbar spine good range of motion.  No midline spinal tenderness.  Mild SI joint tenderness bilaterally.  Shoulder joints have good range of motion with discomfort and stiffness bilaterally.  Elbow joints, wrist joints,  MCPs, PIPs, DIPs good range of motion with no synovitis.  He has complete fist formation bilaterally.  He has PIP and DIP thickening.  Hip joints have good range of motion with some discomfort bilaterally.  Left knee joint warmth and effusion noted.  Right knee has good range of motion with no warmth or effusion.  Ankle joints have good range of motion with no tenderness or synovitis.  No Achilles tendinitis or plantar fasciitis.  CDAI Exam: CDAI Score: -- Patient Global: --; Provider Global: -- Swollen: 1 ; Tender: 7  Joint Exam 09/27/2019      Right  Left  Glenohumeral   Tender   Tender  MCP 2      Tender  MCP 3      Tender  Hip   Tender   Tender  Knee     Swollen Tender     Investigation: No additional findings.  Imaging: No results found.  Recent Labs: Lab Results  Component Value Date   WBC 6.7 06/27/2019   HGB 15.9 06/27/2019   PLT 229 06/27/2019  NA 134 (L) 06/27/2019   K 4.5 06/27/2019   CL 101 06/27/2019   CO2 25 06/27/2019   GLUCOSE 147 (H) 06/27/2019   BUN 13 06/27/2019   CREATININE 0.93 06/27/2019   BILITOT 0.6 06/27/2019   ALKPHOS 48 06/12/2018   AST 28 06/27/2019   ALT 45 06/27/2019   PROT 6.6 06/27/2019   ALBUMIN 4.6 06/12/2018   CALCIUM 9.6 06/27/2019   GFRAA 104 06/27/2019   QFTBGOLDPLUS NEGATIVE 09/19/2018    Speciality Comments: No specialty comments available.  Procedures:  No procedures performed Allergies: Gabapentin   Assessment / Plan:     Visit Diagnoses: Psoriatic arthritis ([place]): He has ongoing warmth and effusion in the left knee joint.  He has been experiencing pain and stiffness in both shoulder joints, both hands, both hip joints, and the left knee joint.  He has not had any Achilles tendinitis or plantar fasciitis.  He has intermittent SI joint pain.  He was originally started on Humira on 11/09/2018 and was injecting every 14 days.  He continued to have frequent flares we increase the frequency of Humira to once weekly starting on  08/13/2019 for 8 weeks.  He will be resuming Humira 40 mg every days injections every 14 days.  We discussed his that he still has active disease and we recommend either combination therapy or switching to Enbrel 50 mg subcutaneous injections once weekly.  We discussed indications, contraindications, potential side effects of both Arava and Enbrel but he declined at this time.  He would like to try changing his diet to see if this helps.  He will continue on Humira 40 mg sq injections every 14 days and will follow up in 2 months.  Psoriasis: He has no psoriasis at this time.   High risk medication use - He started on Humira 06/11/2019 and was injecting every 14 days.  He started injecting Humira on a weekly basis starting on 08/13/2019 (7 weeks). He will resume injecting every 14 days. Inadequate response to Cosentyx in the past.  CBC and CMP were drawn on 06/27/2019.  TB gold was negative on 09/19/2018.  He is due to update CBC, CMP, and TB gold today.  He received his first COVID-19 vaccination yesterday on 09/26/2019.- Plan: CBC with Differential/Platelet, COMPLETE METABOLIC PANEL WITH GFR, QuantiFERON-TB Gold Plus  Effusion, left knee: He presents today with a left knee joint effusion.  He has persistent discomfort and stiffness in the left knee.  He has been walking 20 to 30 minutes daily and some days he has more difficulty than others.  He does not want to make any medication changes at this time.   Primary osteoarthritis of both hands: PIP and DIP thickening.  No synovitis.  Complete fist formation bilaterally.  Joint protection and muscle strengthening were discussed.   Primary osteoarthritis of both feet: He is not having any discomfort in his feet at this time.  He wears proper fitting shoes.   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   Elevated LFTs: LFTs were within normal limits on 06/27/2019.  CMP was ordered today.  Other medical conditions are listed as  follows:  Moderate alcohol consumption  History of gastroesophageal reflux (GERD)  Essential hypertension  Family history of ankylosing spondylitis  Family history of Wegener's granulomatosis  Orders: Orders Placed This Encounter  Procedures  . CBC with Differential/Platelet  . COMPLETE METABOLIC PANEL WITH GFR  . QuantiFERON-TB Gold Plus   No orders of the defined  types were placed in this encounter.   Face-to-face time spent with patient was 30 minutes. Greater than 50% of time was spent in counseling and coordination of care.  Follow-Up Instructions: Return in about 2 months (around 11/27/2019) for Psoriatic arthritis, Osteoarthritis.   Ofilia Neas, PA-C   I examined and evaluated the patient with [name] PA.  Patient continues to have active disease and significant inflammation despite being on Humira every week for the last 7 weeks.  We had detailed discussion regarding different treatment options.  He had elevated LFTs on methotrexate.  We discussed adding Arava but he declined.  He wants to try 2 months of dietary modifications.  We will see him back in 2 months.  We may consider switching him to Enbrel but he is not willing for combination therapy.  The plan of care was discussed as noted above.  Bo Merino, MD  Note - This record has been created using Editor, commissioning.  Chart creation errors have been sought, but may not always  have been located. Such creation errors do not reflect on  the standard of medical care.

## 2019-09-27 ENCOUNTER — Other Ambulatory Visit: Payer: Self-pay

## 2019-09-27 ENCOUNTER — Ambulatory Visit: Payer: BC Managed Care – PPO | Admitting: Rheumatology

## 2019-09-27 ENCOUNTER — Encounter: Payer: Self-pay | Admitting: Rheumatology

## 2019-09-27 VITALS — BP 122/77 | HR 68 | Resp 15 | Ht 75.0 in | Wt 276.8 lb

## 2019-09-27 DIAGNOSIS — M19071 Primary osteoarthritis, right ankle and foot: Secondary | ICD-10-CM

## 2019-09-27 DIAGNOSIS — L409 Psoriasis, unspecified: Secondary | ICD-10-CM | POA: Diagnosis not present

## 2019-09-27 DIAGNOSIS — Z8269 Family history of other diseases of the musculoskeletal system and connective tissue: Secondary | ICD-10-CM

## 2019-09-27 DIAGNOSIS — M19072 Primary osteoarthritis, left ankle and foot: Secondary | ICD-10-CM

## 2019-09-27 DIAGNOSIS — D809 Immunodeficiency with predominantly antibody defects, unspecified: Secondary | ICD-10-CM

## 2019-09-27 DIAGNOSIS — Z8719 Personal history of other diseases of the digestive system: Secondary | ICD-10-CM

## 2019-09-27 DIAGNOSIS — R7989 Other specified abnormal findings of blood chemistry: Secondary | ICD-10-CM

## 2019-09-27 DIAGNOSIS — M25462 Effusion, left knee: Secondary | ICD-10-CM | POA: Diagnosis not present

## 2019-09-27 DIAGNOSIS — Z79899 Other long term (current) drug therapy: Secondary | ICD-10-CM | POA: Diagnosis not present

## 2019-09-27 DIAGNOSIS — M19041 Primary osteoarthritis, right hand: Secondary | ICD-10-CM

## 2019-09-27 DIAGNOSIS — L4050 Arthropathic psoriasis, unspecified: Secondary | ICD-10-CM | POA: Diagnosis not present

## 2019-09-27 DIAGNOSIS — M19042 Primary osteoarthritis, left hand: Secondary | ICD-10-CM

## 2019-09-27 DIAGNOSIS — Z832 Family history of diseases of the blood and blood-forming organs and certain disorders involving the immune mechanism: Secondary | ICD-10-CM

## 2019-09-27 DIAGNOSIS — Z789 Other specified health status: Secondary | ICD-10-CM

## 2019-09-27 DIAGNOSIS — I10 Essential (primary) hypertension: Secondary | ICD-10-CM

## 2019-09-27 NOTE — Patient Instructions (Signed)
Resume Humira 40 mg sq injections every 14 days

## 2019-09-28 ENCOUNTER — Encounter: Payer: Self-pay | Admitting: Rheumatology

## 2019-09-28 NOTE — Telephone Encounter (Signed)
Please notify the patient that the monocyte and absolute eosinophil count are only borderline elevated and his WBC count is WNL.  These values may be borderline elevated due to receiving the covid-19 vaccine but are not a concern at this time since his WBC count remains normal.  If he is concerned we can recheck CBC in 2 weeks.

## 2019-09-29 LAB — COMPLETE METABOLIC PANEL WITH GFR
AG Ratio: 1.5 (calc) (ref 1.0–2.5)
ALT: 43 U/L (ref 9–46)
AST: 25 U/L (ref 10–35)
Albumin: 4.4 g/dL (ref 3.6–5.1)
Alkaline phosphatase (APISO): 77 U/L (ref 35–144)
BUN: 14 mg/dL (ref 7–25)
CO2: 25 mmol/L (ref 20–32)
Calcium: 10.3 mg/dL (ref 8.6–10.3)
Chloride: 98 mmol/L (ref 98–110)
Creat: 0.95 mg/dL (ref 0.70–1.33)
GFR, Est African American: 101 mL/min/{1.73_m2} (ref >=60)
GFR, Est Non African American: 87 mL/min/{1.73_m2} (ref >=60)
Globulin: 2.9 g/dL (calc) (ref 1.9–3.7)
Glucose, Bld: 90 mg/dL (ref 65–99)
Potassium: 4.8 mmol/L (ref 3.5–5.3)
Sodium: 132 mmol/L — ABNORMAL LOW (ref 135–146)
Total Bilirubin: 0.6 mg/dL (ref 0.2–1.2)
Total Protein: 7.3 g/dL (ref 6.1–8.1)

## 2019-09-29 LAB — CBC WITH DIFFERENTIAL/PLATELET
Absolute Monocytes: 1420 cells/uL — ABNORMAL HIGH (ref 200–950)
Basophils Absolute: 100 cells/uL (ref 0–200)
Basophils Relative: 1.1 %
Eosinophils Absolute: 601 cells/uL — ABNORMAL HIGH (ref 15–500)
Eosinophils Relative: 6.6 %
HCT: 44.9 % (ref 38.5–50.0)
Hemoglobin: 15.8 g/dL (ref 13.2–17.1)
Lymphs Abs: 2930 cells/uL (ref 850–3900)
MCH: 32.1 pg (ref 27.0–33.0)
MCHC: 35.2 g/dL (ref 32.0–36.0)
MCV: 91.3 fL (ref 80.0–100.0)
MPV: 10.1 fL (ref 7.5–12.5)
Monocytes Relative: 15.6 %
Neutro Abs: 4050 cells/uL (ref 1500–7800)
Neutrophils Relative %: 44.5 %
Platelets: 318 10*3/uL (ref 140–400)
RBC: 4.92 10*6/uL (ref 4.20–5.80)
RDW: 13.1 % (ref 11.0–15.0)
Total Lymphocyte: 32.2 %
WBC: 9.1 10*3/uL (ref 3.8–10.8)

## 2019-09-29 LAB — QUANTIFERON-TB GOLD PLUS
Mitogen-NIL: >10 IU/mL
NIL: 0.06 IU/mL
QuantiFERON-TB Gold Plus: NEGATIVE
TB1-NIL: <0 IU/mL
TB2-NIL: <0 IU/mL

## 2019-10-01 NOTE — Progress Notes (Signed)
Sodium is low-132.  Please notify patient and forward to PCP.   Absolute monocyte and eosinophils are mildly elevated but WBC count is WNL.  We will continue to monitor.  TB gold negative.

## 2019-10-14 ENCOUNTER — Encounter: Payer: Self-pay | Admitting: Rheumatology

## 2019-10-15 NOTE — Telephone Encounter (Signed)
Patient scheduled for 10/16/19 at 8:20 am to discuss medication options.

## 2019-10-15 NOTE — Progress Notes (Signed)
Office Visit Note  Patient: Nathaniel Velazquez             Date of Birth: 02/11/1961           MRN: [id number]             PCP: [name], MD Referring: [name], MD Visit Date: 10/16/2019 Occupation: @GUAROCC @  Subjective:  Discuss medication options   History of Present Illness: Nathaniel Velazquez is a 59 y.o. male with history of psoriatic arthritis and osteoarthritis.  His last dose of Humira was on 10/01/2019.  He states that he is due for Humira injection yesterday but held off due to wanting to discuss starting on Enbrel.  He continues to have active pain and inflammation in multiple joints including the left hand, both hip joints, and left knee.  He states that his left knee has warmth and effusion but he would like to hold off having a joint aspiration at this time.  He states that he has upcoming trips for work at the end of April and in June and he is concerned about his persistent joint pain and inflammation.  He denies any SI joint pain, achilles tendonitis, or achilles tendonitis.  He denies any active psoriasis. He will be receiving his second covid-19 vaccine tomorrow.   Activities of Daily Living:  Patient reports joint stiffness all day  Patient Reports nocturnal pain.  Difficulty dressing/grooming: Reports Difficulty climbing stairs: Reports Difficulty getting out of chair: Reports Difficulty using hands for taps, buttons, cutlery, and/or writing: Reports  Review of Systems  Constitutional: Negative for fatigue and night sweats.  HENT: Negative for mouth sores, mouth dryness and nose dryness.   Eyes: Negative for redness, itching and dryness.  Respiratory: Negative for cough, hemoptysis, shortness of breath, wheezing and difficulty breathing.   Cardiovascular: Negative for chest pain, palpitations, hypertension, irregular heartbeat and swelling in legs/feet.  Gastrointestinal: Negative for blood in stool, constipation and diarrhea.  Endocrine: Negative  for increased urination.  Genitourinary: Negative for difficulty urinating and painful urination.  Musculoskeletal: Positive for arthralgias, joint pain, joint swelling and morning stiffness. Negative for myalgias, muscle weakness, muscle tenderness and myalgias.  Skin: Negative for color change, rash, hair loss, nodules/bumps, redness, skin tightness, ulcers and sensitivity to sunlight.  Allergic/Immunologic: Negative for susceptible to infections.  Neurological: Negative for dizziness, fainting, numbness, headaches, memory loss, night sweats and weakness.  Hematological: Negative for bruising/bleeding tendency and swollen glands.  Psychiatric/Behavioral: Positive for sleep disturbance. Negative for depressed mood and confusion. The patient is not nervous/anxious.     PMFS History:  Patient Active Problem List   Diagnosis Date Noted  . Pain in left knee 06/28/2019  . 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 BMI 38.0-38.9,adult 09/19/2018  . Family history of Wegener's granulomatosis 09/19/2018  . Moderate alcohol consumption 09/19/2018  . Psoriasis 09/19/2018  . Psoriatic arthritis ([place]) 09/19/2018  . Rectal pain 06/05/2015  . Hypertension   . Obesity   . Hyperlipidemia 11/21/2008  . PALPITATIONS 10/30/2008    Past Medical History:  Diagnosis Date  . Arthritis   . GERD (gastroesophageal reflux disease)   . Hypertension   . Obesity    Steroid injection in rt knee    Family History  Problem Relation Age of Onset  . Hypertrophic cardiomyopathy Brother   . Heart disease Mother   . Hypertension Mother   . Heart disease Father   . Hypertension Father   . Obesity Brother   . Heart  disease Brother   . Healthy Daughter   . Spondylitis Son   . Autoimmune disease Brother   . Healthy Daughter   . Colon cancer Neg Hx   . Colon polyps Neg Hx   . Esophageal cancer Neg Hx   . Pancreatic cancer Neg Hx   . Stomach cancer Neg Hx   . Rectal cancer Neg Hx    Past Surgical  History:  Procedure Laterality Date  . ANAL FISTULECTOMY  1987  . APPENDECTOMY    . KNEE SURGERY Bilateral    meniscal tear repairs  . ROTATOR CUFF REPAIR Bilateral 2018  . 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 Influenza, Quadrivalent, Recombinant, Inj, Pf 06/05/2018, 04/23/2019  . Influenza,inj,Quad PF,6+ Mos 05/17/2013, 04/10/2014, 04/23/2015, 04/06/2017  . Influenza,inj,quad, With Preservative 03/19/2017  . Influenza-Unspecified 05/19/2016, 05/18/2018, 04/23/2019  . Pneumococcal Polysaccharide-23 12/28/2018  . Td 07/19/1998  . Tdap 04/10/2014     Objective: Vital Signs: BP (!) 143/86 (BP Location: Left Arm, Patient Position: Sitting, Cuff Size: Normal)   Pulse 66   Resp 16   Ht 6\' 3"  (1.905 m)   Wt 280 lb 6.4 oz (127.2 kg)   BMI 35.05 kg/m    Physical Exam Vitals and nursing note reviewed.  Constitutional:      Appearance: He is well-developed.  HENT:     Head: Normocephalic and atraumatic.  Eyes:     Conjunctiva/sclera: Conjunctivae normal.     Pupils: Pupils are equal, round, and reactive to light.  Pulmonary:     Effort: Pulmonary effort is normal.  Abdominal:     General: Bowel sounds are normal.     Palpations: Abdomen is soft.  Musculoskeletal:     Cervical back: Normal range of motion and neck supple.  Skin:    General: Skin is warm and dry.     Capillary Refill: Capillary refill takes less than 2 seconds.  Neurological:     Mental Status: He is alert and oriented to person, place, and time.  Psychiatric:        Behavior: Behavior normal.      Musculoskeletal Exam: C-spine, thoracic spine, and lumbar spine good ROM.  No midline spinal tenderness.  No SI joint tenderness.  Shoulder joints, elbow joints, wrist joints, MCPs, PIPs, and DIPs good ROM with no synovitis  Tenderness of the left 1st, 2nd, and 3rd MCP joints.  Incomplete left fist formation.  PIP  and DIP thickening noted.  Hip joints have limited and painful ROM.  Right knee has good ROM with no warmth or effusion.  Left knee warmth and moderate effusion noted.  Ankle joints good ROM with no tenderness or inflammation.  No achilles tendonitis or plantar fasciitis.   CDAI Exam: CDAI Score: 6.2  Patient Global: 6 mm; Provider Global: 6 mm Swollen: 1 ; Tender: 6  Joint Exam 10/16/2019      Right  Left  MCP 1      Tender  MCP 2      Tender  MCP 3      Tender  Hip   Tender   Tender  Knee     Swollen Tender     Investigation: No additional findings.  Imaging: No results found.  Recent Labs: Lab Results  Component Value Date   WBC 9.1 09/27/2019   HGB 15.8 09/27/2019   PLT 318 09/27/2019   NA 132 (  L) 09/27/2019   K 4.8 09/27/2019   CL 98 09/27/2019   CO2 25 09/27/2019   GLUCOSE 90 09/27/2019   BUN 14 09/27/2019   CREATININE 0.95 09/27/2019   BILITOT 0.6 09/27/2019   ALKPHOS 48 06/12/2018   AST 25 09/27/2019   ALT 43 09/27/2019   PROT 7.3 09/27/2019   ALBUMIN 4.6 06/12/2018   CALCIUM 10.3 09/27/2019   GFRAA 101 09/27/2019   QFTBGOLDPLUS NEGATIVE 09/27/2019    Speciality Comments: No specialty comments available.  Procedures:  No procedures performed Allergies: Gabapentin   Assessment / Plan:     Visit Diagnoses: Psoriatic arthritis ([place]) - He presents today with tenderness in the left first, second, and third MCP joints but no synovitis or dactylitis was noted.  He has warmth and a moderate effusion in the left knee joint.  He is also experiencing limited and painful range of motion in both hip joints.  He has no SI joint tenderness on exam today.  No Achilles tendinitis or plantar fasciitis.  He has no active psoriasis at this time.  He previously had an inadequate response to Cosentyx.  He was started on Humira 40 mg subcutaneous injections every 14 days on 06/11/2019 but continued to have recurrent flares.  He tried increasing the frequency of Humira to 40 mg  every week but did not notice any improvement.  We discussed switching him to Enbrel 50 mg subcutaneous injections once weekly.  Indications, contraindications, potential side effects of Enbrel were discussed today.  All questions were addressed and consent was obtained.  The first dose of Enbrel was administered today in the office.  We will apply for Enbrel through his insurance.  He is not ready to proceed with Arava at this time.  We discussed that if he continues to have recurrent flares he will benefit from combination therapy.  He declined a prednisone taper and a left knee joint aspiration today.  He will follow-up in the office in 6 weeks.  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  No  Counseled patient that Enbrel is a TNF blocking agent.  Reviewed Enbrel dose of 50 mg once weekly.  Counseled patient on purpose, proper use, and adverse effects of Enbrel.  Reviewed the most common adverse effects including infections, headache, and injection site reactions. Discussed that there is the possibility of an increased risk of malignancy but it is not well understood if this increased risk is due to the medication or the disease state.  Advised patient to get yearly dermatology exams due to risk of skin cancer.  Reviewed the importance of regular labs while on Enbrel therapy.  Advised patient to get standing labs one month after starting Enbrel then every 2 months.  Provided patient with standing lab orders.  Counseled patient that Enbrel should be held prior to scheduled surgery.  Counseled patient to avoid live vaccines while on Enbrel.  Advised patient to get annual influenza vaccine and the pneumococcal vaccine as needed.  Provided patient  with medication education material and answered all questions.  Patient voiced understanding.  Patient consented to Enbrel.  Will upload consent into the media tab.  Reviewed storage instructions for Enbrel.  Advised initial injection must be administered in office.  Patient voiced understanding.  Psoriasis: He has no active psoriasis at this time.  He has clobetasol which he can apply topically as needed.  High risk medication use - Humira-started on 06/11/2019-initially he was injecting Humira every 14 days but tried injecting on a weekly basis for 8 weeks but did not notice any improvement.  He previously had an inadequate response to Cosentyx.  We will be applying for Enbrel 50 mg of his injections once weekly through his insurance.  He will return for lab work in 1 month and every 3 months.  He has all necessary baseline immunosuppressive labs.  Effusion, left knee: He has warmth and moderate effusion in the left knee joint on exam.  He declined a left knee joint aspiration today.  Primary osteoarthritis of both hands: He has PIP and DIP thickening consistent with osteoarthritis of both hands.  No tenderness or synovitis was noted.  He has incomplete left fist formation.  Joint protection and muscle strengthening were discussed.  Primary osteoarthritis of both feet: He is not having any discomfort in his feet at this time.  Ankle joints have good ROM with no discomfort.  No tenderness or synovitis of ankle joints noted. He wears proper fitting shoes.  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   Elevated LFTs: WNL on 09/27/19  Moderate alcohol consumption: He has greatly reduced his alcohol intake and has been changing his diet.   Other medical conditions are listed as follows:   Essential hypertension  History of gastroesophageal reflux (GERD)  Family history of Wegener's granulomatosis  Family history of ankylosing spondylitis  Orders: No  orders of the defined types were placed in this encounter.  No orders of the defined types were placed in this encounter.   Face-to-face time spent with patient was 30 minutes. Greater than 50% of time was spent in counseling and coordination of care.  Follow-Up Instructions: Return in about 6 weeks (around 11/27/2019) for Psoriatic arthritis.   Ofilia Neas, PA-C  Note - This record has been created using Dragon software.  Chart creation errors have been sought, but may not always  have been located. Such creation errors do not reflect on  the standard of medical care.

## 2019-10-16 ENCOUNTER — Telehealth: Payer: Self-pay | Admitting: Pharmacy Technician

## 2019-10-16 ENCOUNTER — Ambulatory Visit: Payer: BC Managed Care – PPO | Admitting: Physician Assistant

## 2019-10-16 ENCOUNTER — Other Ambulatory Visit: Payer: Self-pay

## 2019-10-16 ENCOUNTER — Encounter: Payer: Self-pay | Admitting: Physician Assistant

## 2019-10-16 VITALS — BP 127/72 | HR 66 | Resp 16 | Ht 75.0 in | Wt 280.4 lb

## 2019-10-16 DIAGNOSIS — M25462 Effusion, left knee: Secondary | ICD-10-CM | POA: Diagnosis not present

## 2019-10-16 DIAGNOSIS — M19041 Primary osteoarthritis, right hand: Secondary | ICD-10-CM

## 2019-10-16 DIAGNOSIS — M19072 Primary osteoarthritis, left ankle and foot: Secondary | ICD-10-CM

## 2019-10-16 DIAGNOSIS — Z8269 Family history of other diseases of the musculoskeletal system and connective tissue: Secondary | ICD-10-CM

## 2019-10-16 DIAGNOSIS — Z79899 Other long term (current) drug therapy: Secondary | ICD-10-CM

## 2019-10-16 DIAGNOSIS — L409 Psoriasis, unspecified: Secondary | ICD-10-CM

## 2019-10-16 DIAGNOSIS — M19042 Primary osteoarthritis, left hand: Secondary | ICD-10-CM

## 2019-10-16 DIAGNOSIS — Z789 Other specified health status: Secondary | ICD-10-CM

## 2019-10-16 DIAGNOSIS — L4050 Arthropathic psoriasis, unspecified: Secondary | ICD-10-CM | POA: Diagnosis not present

## 2019-10-16 DIAGNOSIS — D809 Immunodeficiency with predominantly antibody defects, unspecified: Secondary | ICD-10-CM

## 2019-10-16 DIAGNOSIS — Z832 Family history of diseases of the blood and blood-forming organs and certain disorders involving the immune mechanism: Secondary | ICD-10-CM

## 2019-10-16 DIAGNOSIS — Z8719 Personal history of other diseases of the digestive system: Secondary | ICD-10-CM

## 2019-10-16 DIAGNOSIS — R7989 Other specified abnormal findings of blood chemistry: Secondary | ICD-10-CM

## 2019-10-16 DIAGNOSIS — I10 Essential (primary) hypertension: Secondary | ICD-10-CM

## 2019-10-16 DIAGNOSIS — M19071 Primary osteoarthritis, right ankle and foot: Secondary | ICD-10-CM

## 2019-10-16 MED ORDER — ENBREL MINI 50 MG/ML ~~LOC~~ SOCT: 50.0000 mg | SUBCUTANEOUS | 0 refills | Status: DC

## 2019-10-16 MED ORDER — ETANERCEPT 50 MG/ML ~~LOC~~ SOSY
50.0000 mg | PREFILLED_SYRINGE | Freq: Once | SUBCUTANEOUS | Status: AC
  Administered 2019-10-16: 50 mg via SUBCUTANEOUS
  Filled 2019-10-16: qty 0.98

## 2019-10-16 NOTE — Progress Notes (Signed)
Pharmacy Note  Subjective: Patient presents today to the [facility] for follow up office visit.  Patient seen by the pharmacist for counseling on Enbrel for psoriatic arthritis.  Prior therapy includes: Otezla, Cosentyx, and Humira (inadequate response).Last Humira dose on 10/01/2019.  He currently drinks 3-4 beers a night and has history of fatty liver disease so not a good candidate for MTX.   Objective:  CBC    Component Value Date/Time   WBC 9.1 09/27/2019 1510   RBC 4.92 09/27/2019 1510   HGB 15.8 09/27/2019 1510   HCT 44.9 09/27/2019 1510   PLT 318 09/27/2019 1510   MCV 91.3 09/27/2019 1510   MCV 94.4 05/02/2018 1418   MCH 32.1 09/27/2019 1510   MCHC 35.2 09/27/2019 1510   RDW 13.1 09/27/2019 1510   LYMPHSABS 2,930 09/27/2019 1510   EOSABS 601 (H) 09/27/2019 1510   BASOSABS 100 09/27/2019 1510     CMP     Component Value Date/Time   NA 132 (L) 09/27/2019 1510   NA 140 06/12/2018 1515   K 4.8 09/27/2019 1510   CL 98 09/27/2019 1510   CO2 25 09/27/2019 1510   GLUCOSE 90 09/27/2019 1510   BUN 14 09/27/2019 1510   BUN 10 06/12/2018 1515   CREATININE 0.95 09/27/2019 1510   CALCIUM 10.3 09/27/2019 1510   PROT 7.3 09/27/2019 1510   PROT 6.4 06/12/2018 1515   ALBUMIN 4.6 06/12/2018 1515   AST 25 09/27/2019 1510   ALT 43 09/27/2019 1510   ALKPHOS 48 06/12/2018 1515   BILITOT 0.6 09/27/2019 1510   BILITOT 1.2 06/12/2018 1515   GFRNONAA 87 09/27/2019 1510   GFRAA 101 09/27/2019 1510     Baseline Immunosuppressant Therapy Labs TB GOLD Quantiferon TB Gold Latest Ref Rng & Units 09/27/2019  Quantiferon TB Gold Plus NEGATIVE NEGATIVE   Hepatitis Panel Hepatitis Latest Ref Rng & Units 09/19/2018  Hep B Surface Ag NON-REACTI NON-REACTIVE  Hep B IgM NON-REACTI NON-REACTIVE  Hep C Ab NEGATIVE -  Hep C Ab NON-REACTI NON-REACTIVE  Hep C Ab NON-REACTI NON-REACTIVE   HIV Lab Results  Component Value Date   HIV NON-REACTIVE 09/19/2018    Immunoglobulins Immunoglobulin Electrophoresis Latest Ref Rng & Units 09/19/2018  IgA  47 - 310 mg/dL <5(L)  IgG 600 - 1,640 mg/dL 1,248  IgM 50 - 300 mg/dL 46(L)   SPEP Serum Protein Electrophoresis Latest Ref Rng & Units 09/27/2019  Total Protein 6.1 - 8.1 g/dL 7.3  Albumin 3.8 - 4.8 g/dL -  Alpha-1 0.2 - 0.3 g/dL -  Alpha-2 0.5 - 0.9 g/dL -  Beta Globulin 0.4 - 0.6 g/dL -  Beta 2 0.2 - 0.5 g/dL -  Gamma Globulin 0.8 - 1.7 g/dL -   G6PD No results found for: G6PDH TPMT No results found for: TPMT   Chest x-ray: no active cardiopulmonary disease 05/02/2018  Does patient have diagnosis of heart failure?  No  Assessment/Plan:  Counseled patient that Enbrel is a TNF blocking agent. Counseled patient on purpose, proper use, and adverse effects of Enbrel.  Reviewed the most common adverse effects including infections, headache, and injection site reactions. Discussed that there is the possibility of an increased risk of malignancy but it is not well understood if this increased risk is due to the medication or the disease state.  Advised patient to get yearly dermatology exams due to risk of skin cancer.  Counseled patient that Enbrel should be held prior to scheduled surgery.  Counseled patient  to avoid live vaccines while on Enbrel. Recommend annual influenza, Pneumovax 23, Prevnar 13, and Shingrix as indicated.   Reviewed the importance of regular labs while on Enbrel therapy.  Advised patient to get standing labs one month after starting Enbrel then every 2 months.  Provided patient with standing lab orders.  Provided patient with medication education material and answered all questions.  Patient voiced understanding.  Patient consented to Enbrel.  Will upload consent into the media tab.  Reviewed storage instructions for Enbrel.  Advised initial injection must be administered in office.  Patient voiced understanding.    Patient dose will be Enbrel 50 mg every 7 days. Prescription sent  [facility] required per insurance.  He was given brochure on how to enroll in co-pay card.  Demonstrated proper injection technique with Enbrel Mini demo pen.  Patient able to demonstrate proper injection technique using the teach back method.  We currently do not have Enbrel Mini samples to start the patient on.  Administered Enbrel syringe.  Administrations This Visit    etanercept (ENBREL) 50 MG/ML injection 50 mg    Admin Date 10/16/2019 Action Given Dose 50 mg Route Subcutaneous Administered By Deboraha Sprang, RPH-CPP          Patient tolerated well.  Observed for 30 mins in office for adverse reaction and none noted.   All questions encouraged and answered. Instructed patient to call with any questions or concerns.   Mariella Saa, PharmD, [place], [place] [facility] Pharmacist [phone]  10/16/2019 9:19 AM

## 2019-10-16 NOTE — Patient Instructions (Addendum)
   START Enbrel 50 mg every 7 days  REMEMBER you can always give LATE but NEVER early  Please call Thursday to see if we have received shipment of Enbrel Mini samples.  If we haven't set up an  Nurse visit appointment on Tuesday and we can administer another syringe.  CALL Enbrel Support to sign up for co-pay card.  Remember the 5 C's:  COUNTER- leave on the counter at least 30 mins but up to overnight to bring medication to room temperature and prevent stinging  COLD- Placing something cold (like and ice gel pack or cold water bottle) on the injection site just before cleansing with alcohol may help reduce pain  CLARITIN- for the first two weeks of treatment or  the day of, the day before, and the day after injecting to minimize injection site reactions  CORTISONE CREAM- apply if injection site is irritated and itching  CALL ME- if injection site reaction is bigger than the size of your fist, looks infected, blisters, or develop hives

## 2019-10-16 NOTE — Telephone Encounter (Signed)
Submitted a Prior Authorization request to [facility] for ENBREL via Cover My Meds. Will update once we receive a response.  PA# [id number]  9:01 AM Beatriz Chancellor, CPhT

## 2019-10-17 NOTE — Telephone Encounter (Signed)
Received notification from [facility] regarding a prior authorization for ENBREL. Authorization has been APPROVED from 10/16/19 to 10/15/20.   Authorization # [id number]  Per plan, patient must fill [facility]. Patient has Pharmacist, community and can use a Enbrel Copay card.  10:28 AM Beatriz Chancellor, CPhT

## 2019-10-17 NOTE — Telephone Encounter (Signed)
Called patient to notify of approval.  Patient verbalized understanding.  [facility]  He signed up for his co-pay card and should receive co-pay information and 36 hours.  He plans to [facility] once he receives co-pay card information to schedule his first shipment.  Advised that Enbrel mini samples have arrived in clinic and he can come at his convenience for a sample for his next dose on Tuesday in case of shipping delays.  Patient verbalized understanding.  Sample was reserved for patient in refrigerator.   Mariella Saa, PharmD, [place], [place] [facility] Special[facility] Pharmacist [phone]  10/17/2019 11:24 AM

## 2019-10-18 NOTE — Telephone Encounter (Signed)
Medication Samples have been provided to the patient.  Drug name: Enbrel    Strength: 50 mg    Qty: 1  LOT: [id number]  Exp.Date: 02/2021  Dosing instructions: Inject one cartridge once weekly.    The patient has been instructed regarding the correct time, dose, and frequency of taking this medication, including desired effects and most common side effects.   Gwenlyn Perking 1:28 PM 10/18/2019

## 2019-10-24 ENCOUNTER — Ambulatory Visit: Payer: BC Managed Care – PPO | Admitting: Family Medicine

## 2019-10-24 ENCOUNTER — Encounter: Payer: Self-pay | Admitting: Family Medicine

## 2019-10-24 ENCOUNTER — Other Ambulatory Visit: Payer: Self-pay

## 2019-10-24 VITALS — BP 148/88 | HR 68 | Temp 98.0°F | Ht 75.0 in | Wt 281.0 lb

## 2019-10-24 DIAGNOSIS — E785 Hyperlipidemia, unspecified: Secondary | ICD-10-CM

## 2019-10-24 DIAGNOSIS — R7303 Prediabetes: Secondary | ICD-10-CM

## 2019-10-24 DIAGNOSIS — R748 Abnormal levels of other serum enzymes: Secondary | ICD-10-CM

## 2019-10-24 DIAGNOSIS — I10 Essential (primary) hypertension: Secondary | ICD-10-CM

## 2019-10-24 MED ORDER — LISINOPRIL 10 MG PO TABS: 10.0000 mg | ORAL_TABLET | Freq: Every day | ORAL | 2 refills | Status: DC

## 2019-10-24 MED ORDER — METOPROLOL SUCCINATE ER 25 MG PO TB24: 25.0000 mg | ORAL_TABLET | Freq: Every day | ORAL | 2 refills | Status: DC

## 2019-10-24 NOTE — Patient Instructions (Addendum)
  Keep a record of your blood pressures outside of the office and if over 140/90 - let me know as we may need to adjust your meds. Watch salt intake including restaurant food.   Lab only visit for cholesterol and hemoglobin A1c OR if that is drawn at rheumatology have them send me results.    If you have lab work done today you will be contacted with your lab results within the next 2 weeks.  If you have not heard from [place] then please contact us. The fastest way to get your results is to register for My Chart.   IF you received an x-ray today, you will receive an invoice from [facility]. Please contact [facility] at [phone] with questions or concerns regarding your invoice.   IF you received labwork today, you will receive an invoice from [facility]. Please contact [facility] at [phone] with questions or concerns regarding your invoice.   Our billing staff will not be able to assist you with questions regarding bills from these companies.  You will be contacted with the lab results as soon as they are available. The fastest way to get your results is to activate your My Chart account. Instructions are located on the last page of this paperwork. If you have not heard from [place] regarding the results in 2 weeks, please contact this office.

## 2019-10-24 NOTE — Progress Notes (Signed)
Subjective:  Patient ID: Nathaniel Velazquez, male    DOB: 1961-07-01  Age: 59 y.o. MRN: [id number]  CC:  Chief Complaint  Patient presents with  . Hypertension    pt hasn't had any issues with BP at home.   . elevated Liver enzym    pt needs a recheck on liver enzymes.     HPI Nathaniel Velazquez presents for   Hypertension: Lisinopril 10 mg daily.  Toprol-XL 25 mg daily. Home readings: 120s/70-80.  No missed doses of meds.  No CP/dyspnea. Rare dizziness if standing up too quick.y if sitting for awhile - for years, no changes.  Salt - some added salt and restaurant food.  3 alcohol drinks last night.  BP Readings from Last 3 Encounters:  10/24/19 (!) 148/88  10/16/19 127/72  09/27/19 122/77   Lab Results  Component Value Date   CREATININE 0.95 09/27/2019   Hyperlipidemia: Most recent testing in 2019 was normal, not on statin.  Not fasting. Has planned follow up for bloodwork in May at rheum. Lab Results  Component Value Date   CHOL 155 06/12/2018   HDL 59 06/12/2018   LDLCALC 75 06/12/2018   LDLDIRECT 91.3 06/19/2012   TRIG 104 06/12/2018   CHOLHDL 2.6 06/12/2018   Lab Results  Component Value Date   ALT 43 09/27/2019   AST 25 09/27/2019   ALKPHOS 48 06/12/2018   BILITOT 0.6 09/27/2019   Psoriatic arthritis Followed by rheumatology, [name].  Recently seen by [name] on March 30.  Prior inadequate response to Cosentyx, no prior improvement with Humira.  Started on Enbrel. Plan to see if improvement in 2-3 months.  Knee, hip, hands primary areas.   History of elevated LFTs. Ultrasound in 2017 indicating likely fatty infiltrated disease.  2 to 3 liquor drinks per day at that time.  Most recent LFTs March 11 were normal. Has cut back on alcohol - was drinking more last year with pandemic. Drinking 2 cups coffee per day, milk thistle. Alcohol only few days per week.  Walking as possible - limited by knee/hip issues with psoriatric arthritis.  Cut back on  nsaids.  Lab Results  Component Value Date   ALT 43 09/27/2019   AST 25 09/27/2019   ALKPHOS 48 06/12/2018   BILITOT 0.6 09/27/2019    Obesity/prediabetes Last A1c elevated in 2019. Weight loss - about 19 pounds past year. Trying to walk a mile daily - as above, limited with his Psoriatic arthritis.    Lab Results  Component Value Date   HGBA1C 5.7 (H) 06/12/2018   Wt Readings from Last 3 Encounters:  10/24/19 281 lb (127.5 kg)  10/16/19 280 lb 6.4 oz (127.2 kg)  09/27/19 276 lb 12.8 oz (125.6 kg)     History Patient Active Problem List   Diagnosis Date Noted  . Pain in left knee 06/28/2019  . 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 BMI 38.0-38.9,adult 09/19/2018  . Family history of Wegener's granulomatosis 09/19/2018  . Moderate alcohol consumption 09/19/2018  . Psoriasis 09/19/2018  . Psoriatic arthritis ([place]) 09/19/2018  . Rectal pain 06/05/2015  . Hypertension   . Obesity   . Hyperlipidemia 11/21/2008  . PALPITATIONS 10/30/2008   Past Medical History:  Diagnosis Date  . Arthritis   . GERD (gastroesophageal reflux disease)   . Hypertension   . Obesity    Steroid injection in rt knee   Past Surgical History:  Procedure Laterality Date  . ANAL FISTULECTOMY  1987  .  APPENDECTOMY    . KNEE SURGERY Bilateral    meniscal tear repairs  . ROTATOR CUFF REPAIR Bilateral 2018  . TONSILLECTOMY     Allergies  Allergen Reactions  . Gabapentin Other (See Comments)    Made very jittery   Prior to Admission medications   Medication Sig Start Date End Date Taking? Authorizing Provider  Cholecalciferol 25 MCG (1000 UT) capsule Take 1,000 Units by mouth daily.   Yes [provider]  clobetasol ointment (TEMOVATE) 0.05 % as needed. As directed 05/15/12  Yes [provider]  Etanercept (ENBREL MINI) 50 MG/ML SOCT Inject 50 mg into the skin once a week. 10/16/19  Yes Deveshwar, Abel Presto, MD  lisinopril (ZESTRIL) 10 MG tablet TAKE 1 TABLET DAILY  09/17/19  Yes [name], MD  metoprolol succinate (TOPROL-XL) 25 MG 24 hr tablet TAKE 1 TABLET DAILY 09/17/19  Yes [name], MD  Milk Thistle 250 MG CAPS Take by mouth.   Yes [provider]  nystatin-triamcinolone ointment (MYCOLOG) APPLY SPARINGLY TO THE AFFECTED AREA FOR UP TO 7 DAYS Patient taking differently: as needed. APPLY SPARINGLY TO THE AFFECTED AREA FOR UP TO 7 DAYS 06/23/18  Yes [name], MD  psyllium (METAMUCIL) 58.6 % powder Take 1 packet by mouth 2 (two) times daily.    Yes [provider]  sildenafil (REVATIO) 20 MG tablet 1-3 po qd prn. 02/15/19  Yes [name], MD  TURMERIC PO Take by mouth daily.   Yes [provider]  vitamin E (VITAMIN E) 180 MG (400 UNITS) capsule Take 400 Units by mouth daily.   Yes [provider]   Social History   Socioeconomic History  . Marital status: Married    Spouse name: Not on file  . Number of children: Not on file  . Years of education: Not on file  . Highest education level: Not on file  Occupational History  . Not on file  Tobacco Use  . Smoking status: Never Smoker  . Smokeless tobacco: Never Used  Substance and Sexual Activity  . Alcohol use: Yes    Comment: occ  . Drug use: No  . Sexual activity: Never  Other Topics Concern  . Not on file  Social History Narrative   Sales rep   Social Determinants of Health   Financial Resource Strain:   . Difficulty of Paying Living Expenses:   Food Insecurity:   . Worried About Charity fundraiser in the Last Year:   . Arboriculturist in the Last Year:   Transportation Needs:   . Film/video editor (Medical):   Marland Kitchen Lack of Transportation (Non-Medical):   Physical Activity:   . Days of Exercise per Week:   . Minutes of Exercise per Session:   Stress:   . Feeling of Stress :   Social Connections:   . Frequency of Communication with Friends and Family:   . Frequency of Social Gatherings with Friends and Family:     . Attends Religious Services:   . Active Member of Clubs or Organizations:   . Attends Archivist Meetings:   Marland Kitchen Marital Status:   Intimate Partner Violence:   . Fear of Current or Ex-Partner:   . Emotionally Abused:   Marland Kitchen Physically Abused:   . Sexually Abused:     Review of Systems  Constitutional: Negative for fatigue and unexpected weight change.  Eyes: Negative for visual disturbance.  Respiratory: Negative for cough, chest tightness  and shortness of breath.   Cardiovascular: Negative for chest pain, palpitations and leg swelling.  Gastrointestinal: Negative for abdominal pain and blood in stool.  Neurological: Negative for dizziness, light-headedness (min as above with quick standing - no recent changes. ) and headaches.     Objective:   Vitals:   10/24/19 1407 10/24/19 1410  BP: (!) 168/93 (!) 148/88  Pulse: 68   Temp: 98 F (36.7 C)   TempSrc: Temporal   SpO2: 97%   Weight: 281 lb (127.5 kg)   Height: 6\' 3"  (1.905 m)      Physical Exam Vitals reviewed.  Constitutional:      Appearance: He is well-developed.  HENT:     Head: Normocephalic and atraumatic.  Eyes:     Pupils: Pupils are equal, round, and reactive to light.  Neck:     Vascular: No carotid bruit or JVD.  Cardiovascular:     Rate and Rhythm: Normal rate and regular rhythm.     Heart sounds: Normal heart sounds. No murmur.  Pulmonary:     Effort: Pulmonary effort is normal.     Breath sounds: Normal breath sounds. No rales.  Skin:    General: Skin is warm and dry.  Neurological:     Mental Status: He is alert and oriented to person, place, and time.        Assessment & Plan:  Nathaniel Velazquez is a 59 y.o. male . Essential hypertension - Plan: Lipid panel, Comprehensive metabolic panel, lisinopril (ZESTRIL) 10 MG tablet, metoprolol succinate (TOPROL-XL) 25 MG 24 hr tablet  -Elevated in office, but home readings as well as other office readings have been normal.  We will  continue same regimen for now, but monitor him levels, RTC precautions if elevated.  Plan for lab only visit or can have labs done at his next rheumatology appointment in May.  Prediabetes - Plan: Hemoglobin A1c  -Commended on weight loss.  Decreased alcohol also likely helpful.  Plan for labs at lab only visit or through rheumatology  Hyperlipidemia, unspecified hyperlipidemia type - Plan: Lipid panel, Comprehensive metabolic panel  -Anticipate improved readings with weight loss.  Labs pending  Elevated liver enzymes  -Recent levels normal.  Commended on decreased alcohol use.  Meds ordered this encounter  Medications  . lisinopril (ZESTRIL) 10 MG tablet    Sig: Take 1 tablet (10 mg total) by mouth daily.    Dispense:  90 tablet    Refill:  2  . metoprolol succinate (TOPROL-XL) 25 MG 24 hr tablet    Sig: Take 1 tablet (25 mg total) by mouth daily.    Dispense:  90 tablet    Refill:  2   Patient Instructions    Keep a record of your blood pressures outside of the office and if over 140/90 - let me know as we may need to adjust your meds. Watch salt intake including restaurant food.   Lab only visit for cholesterol and hemoglobin A1c OR if that is drawn at rheumatology have them send me results.    If you have lab work done today you will be contacted with your lab results within the next 2 weeks.  If you have not heard from [place] then please contact us. The fastest way to get your results is to register for My Chart.   IF you received an x-ray today, you will receive an invoice from [facility]. Please contact [facility] at [phone] with questions or concerns regarding your  invoice.   IF you received labwork today, you will receive an invoice from [facility]. Please contact [facility] at [phone] with questions or concerns regarding your invoice.   Our billing staff will not be able to assist you with questions regarding bills from these companies.  You will  be contacted with the lab results as soon as they are available. The fastest way to get your results is to activate your My Chart account. Instructions are located on the last page of this paperwork. If you have not heard from [place] regarding the results in 2 weeks, please contact this office.         Signed, [name], MD [facility]

## 2019-10-24 NOTE — Progress Notes (Signed)
pt hasn't had any issues with BP at home. Pt checks BP once a week. Pt takes medication a prescribed. Pt doesn't express any physical symptoms of hypertension.

## 2019-10-29 ENCOUNTER — Encounter: Payer: Self-pay | Admitting: Rheumatology

## 2019-10-29 MED ORDER — PREDNISONE 5 MG PO TABS: ORAL_TABLET | ORAL | 0 refills | Status: DC

## 2019-10-29 NOTE — Telephone Encounter (Signed)
Ok to send in prednisone taper starting at 20 mg tapering by 5 mg every 4 days.

## 2019-11-21 NOTE — Progress Notes (Signed)
Office Visit Note  Patient: Nathaniel Velazquez             Date of Birth: 1960/10/01           MRN: [id number]             PCP: [name], MD Referring: [name], MD Visit Date: 11/29/2019 Occupation: @GUAROCC @  Subjective:  Pain and swelling in multiple joints.   History of Present Illness: Nathaniel Velazquez is a 59 y.o. male with history of psoriatic arthritis and psoriasis.  He has been on Enbrel for about 6 weeks now.  He states he has not noticed any improvement.  All of his joints are painful and swollen.  He describes pain and discomfort in his bilateral wrist joints, bilateral hands, bilateral hips and his knee joints.  He states he is having difficulty walking and doing routine activities.  He states he was doing well until he was on high-dose prednisone once he tapered prednisone his symptoms started coming back.  He has noticed more psoriasis on his extremities and his hands.  Activities of Daily Living:  Patient reports morning stiffness for 24 hours.   Patient Reports nocturnal pain.  Difficulty dressing/grooming: Reports Difficulty climbing stairs: Reports Difficulty getting out of chair: Reports Difficulty using hands for taps, buttons, cutlery, and/or writing: Reports  Review of Systems  Constitutional: Negative for fatigue and night sweats.  HENT: Negative for mouth sores, mouth dryness and nose dryness.   Eyes: Negative for redness and dryness.  Respiratory: Negative for shortness of breath and difficulty breathing.   Cardiovascular: Negative for chest pain, palpitations, hypertension, irregular heartbeat and swelling in legs/feet.  Gastrointestinal: Negative for constipation and diarrhea.  Endocrine: Negative for increased urination.  Musculoskeletal: Positive for arthralgias, joint pain, joint swelling, myalgias, morning stiffness and myalgias. Negative for muscle weakness and muscle tenderness.  Skin: Positive for rash. Negative for color change,  hair loss, nodules/bumps, skin tightness, ulcers and sensitivity to sunlight.  Allergic/Immunologic: Negative for susceptible to infections.  Neurological: Negative for dizziness, fainting, memory loss, night sweats and weakness ( ).  Hematological: Negative for swollen glands.  Psychiatric/Behavioral: Negative for depressed mood and sleep disturbance. The patient is not nervous/anxious.     PMFS History:  Patient Active Problem List   Diagnosis Date Noted  . Pain in left knee 06/28/2019  . 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 BMI 38.0-38.9,adult 09/19/2018  . Family history of Wegener's granulomatosis 09/19/2018  . Moderate alcohol consumption 09/19/2018  . Psoriasis 09/19/2018  . Psoriatic arthritis ([place]) 09/19/2018  . Rectal pain 06/05/2015  . Hypertension   . Obesity   . Hyperlipidemia 11/21/2008  . PALPITATIONS 10/30/2008    Past Medical History:  Diagnosis Date  . Arthritis   . GERD (gastroesophageal reflux disease)   . Hypertension   . Obesity    Steroid injection in rt knee    Family History  Problem Relation Age of Onset  . Hypertrophic cardiomyopathy Brother   . Heart disease Mother   . Hypertension Mother   . Heart disease Father   . Hypertension Father   . Obesity Brother   . Heart disease Brother   . Healthy Daughter   . Spondylitis Son   . Autoimmune disease Brother   . Healthy Daughter   . Colon cancer Neg Hx   . Colon polyps Neg Hx   . Esophageal cancer Neg Hx   . Pancreatic cancer Neg Hx   . Stomach cancer Neg  Hx   . Rectal cancer Neg Hx    Past Surgical History:  Procedure Laterality Date  . ANAL FISTULECTOMY  1987  . APPENDECTOMY    . KNEE SURGERY Bilateral    meniscal tear repairs  . ROTATOR CUFF REPAIR Bilateral 2018  . 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 Influenza, Quadrivalent, Recombinant, Inj, Pf 06/05/2018, 04/23/2019    . Influenza,inj,Quad PF,6+ Mos 05/17/2013, 04/10/2014, 04/23/2015, 04/06/2017  . Influenza,inj,quad, With Preservative 03/19/2017  . Influenza-Unspecified 05/19/2016, 05/18/2018, 04/23/2019  . Pneumococcal Polysaccharide-23 12/28/2018  . Td 07/19/1998  . Tdap 04/10/2014     Objective: Vital Signs: BP (!) 151/82 (BP Location: Left Arm, Patient Position: Sitting, Cuff Size: Large)   Pulse 76   Resp 12   Ht 6\' 3"  (1.905 m)   Wt 281 lb (127.5 kg)   BMI 35.12 kg/m    Physical Exam Vitals and nursing note reviewed.  Constitutional:      Appearance: He is well-developed.  HENT:     Head: Normocephalic and atraumatic.  Eyes:     Conjunctiva/sclera: Conjunctivae normal.     Pupils: Pupils are equal, round, and reactive to light.  Cardiovascular:     Rate and Rhythm: Normal rate and regular rhythm.     Heart sounds: Normal heart sounds.  Pulmonary:     Effort: Pulmonary effort is normal.     Breath sounds: Normal breath sounds.  Abdominal:     General: Bowel sounds are normal.     Palpations: Abdomen is soft.  Musculoskeletal:     Cervical back: Normal range of motion and neck supple.  Skin:    General: Skin is warm and dry.     Capillary Refill: Capillary refill takes less than 2 seconds.     Comments: Psoriasis rash noted on his palmar surface on both hands.  Few scattered patches were noted on lower extremities.  Neurological:     Mental Status: He is alert and oriented to person, place, and time.  Psychiatric:        Behavior: Behavior normal.      Musculoskeletal Exam: C-spine thoracic and lumbar spine were in good range of motion.  He had no SI joint tenderness.  Shoulder joints were in good range of motion with some discomfort in his right shoulder joint.  Elbow joints, wrist joints, MCPs with good range of motion with no synovitis.  He had thickening of PIP and DIP joints with no synovitis.  Although he had difficulty making complete fist.  He had painful range of  motion of bilateral hip joints.  He had warmth and swelling in his left knee joint with mild effusion.  He had painful range of motion of his right knee joint without any warmth swelling or effusion.  Ankle joints, MTPs and PIPs with good range of motion.  There was no evidence of plantar fasciitis or Achilles tendinitis.  CDAI Exam: CDAI Score: 4.8  Patient Global: 10 mm; Provider Global: 8 mm Swollen: 1 ; Tender: 4  Joint Exam 11/29/2019      Right  Left  Glenohumeral   Tender     Hip   Tender   Tender  Knee     Swollen Tender     Investigation: No additional findings.  Imaging: No results found.  Recent Labs: Lab Results  Component Value Date   WBC 9.1 09/27/2019  HGB 15.8 09/27/2019   PLT 318 09/27/2019   NA 132 (L) 09/27/2019   K 4.8 09/27/2019   CL 98 09/27/2019   CO2 25 09/27/2019   GLUCOSE 90 09/27/2019   BUN 14 09/27/2019   CREATININE 0.95 09/27/2019   BILITOT 0.6 09/27/2019   ALKPHOS 48 06/12/2018   AST 25 09/27/2019   ALT 43 09/27/2019   PROT 7.3 09/27/2019   ALBUMIN 4.6 06/12/2018   CALCIUM 10.3 09/27/2019   GFRAA 101 09/27/2019   QFTBGOLDPLUS NEGATIVE 09/27/2019    Speciality Comments: No specialty comments available.  Procedures:  No procedures performed Allergies: Gabapentin   Assessment / Plan:     Visit Diagnoses: Psoriatic arthritis (HCC)-patient is having a flare getting off prednisone.  He has been off prednisone for 2 weeks now.  He has been on Enbrel for 6 weeks now.  He states he has not noticed any improvement in his symptoms.  He is having pain in multiple joints and discomfort.  He is having difficulty walking and doing routine activities.  He requests another prednisone taper.  He wants to try adding Otezla to Enbrel.  We discussed that in the past.  He had an adequate response to Humira and Cosentyx in the past.  He could not use methotrexate due to elevated LFTs.  I will send another prednisone taper starting at 20 mg and taper by 5 mg  every week.  We will also try to start him on Otezla in combination to Enbrel.  He handout was given and consent was taken on [place].  We will apply for [facility].  Counseled patient that [name] is a PDE 4 inhibitor that works to treat psoriasis and the joint pain and tenderness of psoriatic arthritis.  Counseled patient on purpose, proper use, and adverse effects of Otezla.  Reviewed the most common adverse effects of weight loss, depression, nausea/diarrhea/vomiting, headaches, and nasal congestion.  Advised patient to notify office of any serious changes in mood and/or thoughts of suicide.  Provided patient with medication education material and answered all questions.  Patient consented to [place].    Patient dose will be Otezla starter titration pack and then 30 mg twice daily.  Prescription pending insurance approval and once approved patient may pick up sample for starter pack from our office.  Psoriasis-he has worsening of psoriasis with new patches now.  High risk medication use - enbrel, inadequate response to otezla, humira and cosentyx.  Effusion, left knee-he has persistent effusion in his left knee joint.  Primary osteoarthritis of both hands-he has osteoarthritis in his hands with decreased range of motion.  No synovitis was noted.  Primary osteoarthritis of both feet-he has no tenderness in his feet.  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   Elevated LFTs - WNL on 09/27/19  Moderate alcohol consumption -he has decreased alcohol consumption.  He currently drinks bourbon for times a week.  Essential hypertension  History of gastroesophageal reflux (GERD)  Family history of ankylosing spondylitis  Family history of Wegener's granulomatosis  Orders: No orders of the defined types were placed in this encounter.  Meds ordered this encounter  Medications  . predniSONE (DELTASONE) 5 MG tablet    Sig: Take 4 tabs po x 7 days, 3  tabs po x 7  days, 2  tabs po x 7 days, 1  tab po x 7 days    Dispense:  70 tablet    Refill:  0    Face-to-face  time spent with patient was 30 minutes. Greater than 50% of time was spent in counseling and coordination of care.  Follow-Up Instructions: Return in about 2 months (around 01/29/2020) for Psoriatic arthritis.   Bo Merino, MD  Note - This record has been created using Editor, commissioning.  Chart creation errors have been sought, but may not always  have been located. Such creation errors do not reflect on  the standard of medical care.

## 2019-11-29 ENCOUNTER — Ambulatory Visit: Payer: BC Managed Care – PPO | Admitting: Rheumatology

## 2019-11-29 ENCOUNTER — Encounter: Payer: Self-pay | Admitting: Rheumatology

## 2019-11-29 ENCOUNTER — Telehealth: Payer: Self-pay | Admitting: *Deleted

## 2019-11-29 ENCOUNTER — Other Ambulatory Visit: Payer: Self-pay

## 2019-11-29 VITALS — BP 151/82 | HR 76 | Resp 12 | Ht 75.0 in | Wt 281.0 lb

## 2019-11-29 DIAGNOSIS — Z789 Other specified health status: Secondary | ICD-10-CM

## 2019-11-29 DIAGNOSIS — I10 Essential (primary) hypertension: Secondary | ICD-10-CM

## 2019-11-29 DIAGNOSIS — M25462 Effusion, left knee: Secondary | ICD-10-CM

## 2019-11-29 DIAGNOSIS — M19071 Primary osteoarthritis, right ankle and foot: Secondary | ICD-10-CM

## 2019-11-29 DIAGNOSIS — Z79899 Other long term (current) drug therapy: Secondary | ICD-10-CM | POA: Diagnosis not present

## 2019-11-29 DIAGNOSIS — M19042 Primary osteoarthritis, left hand: Secondary | ICD-10-CM

## 2019-11-29 DIAGNOSIS — L4050 Arthropathic psoriasis, unspecified: Secondary | ICD-10-CM | POA: Diagnosis not present

## 2019-11-29 DIAGNOSIS — L409 Psoriasis, unspecified: Secondary | ICD-10-CM

## 2019-11-29 DIAGNOSIS — Z8269 Family history of other diseases of the musculoskeletal system and connective tissue: Secondary | ICD-10-CM

## 2019-11-29 DIAGNOSIS — M19041 Primary osteoarthritis, right hand: Secondary | ICD-10-CM

## 2019-11-29 DIAGNOSIS — Z832 Family history of diseases of the blood and blood-forming organs and certain disorders involving the immune mechanism: Secondary | ICD-10-CM

## 2019-11-29 DIAGNOSIS — Z8719 Personal history of other diseases of the digestive system: Secondary | ICD-10-CM

## 2019-11-29 DIAGNOSIS — R7989 Other specified abnormal findings of blood chemistry: Secondary | ICD-10-CM

## 2019-11-29 DIAGNOSIS — D809 Immunodeficiency with predominantly antibody defects, unspecified: Secondary | ICD-10-CM

## 2019-11-29 DIAGNOSIS — M19072 Primary osteoarthritis, left ankle and foot: Secondary | ICD-10-CM

## 2019-11-29 MED ORDER — PREDNISONE 5 MG PO TABS: ORAL_TABLET | ORAL | 0 refills | Status: DC

## 2019-11-29 NOTE — Patient Instructions (Addendum)
Apremilast oral tablets What is this medicine? APREMILAST (a PRE mil ast) is used to treat plaque psoriasis, psoriatic arthritis, and certain oral ulcers. This medicine may be used for other purposes; ask your health care provider or pharmacist if you have questions. COMMON BRAND NAME(S): [name] What should I tell my health care provider before I take this medicine? They need to know if you have any of these conditions:  dehydration  kidney disease  mental illness  an unusual or allergic reaction to apremilast, other medicines, foods, dyes, or preservatives  pregnant or trying to get pregnant  breast-feeding How should I use this medicine? Take this medicine by mouth with a glass of water. Follow the directions on the prescription label. Do not cut, crush or chew this medicine. You can take it with or without food. If it upsets your stomach, take it with food. Take your medicine at regular intervals. Do not take it more often than directed. Do not stop taking except on your doctor's advice. Talk to your pediatrician regarding the use of this medicine in children. Special care may be needed. Overdosage: If you think you have taken too much of this medicine contact a poison control center or emergency room at once. NOTE: This medicine is only for you. Do not share this medicine with others. What if I miss a dose? If you miss a dose, take it as soon as you can. If it is almost time for your next dose, take only that dose. Do not take double or extra doses. What may interact with this medicine? This medicine may interact with the following medications:  certain medicines for seizures like carbamazepine, phenobarbital, phenytoin  rifampin This list may not describe all possible interactions. Give your health care provider a list of all the medicines, herbs, non-prescription drugs, or dietary supplements you use. Also tell them if you smoke, drink alcohol, or use illegal drugs. Some items  may interact with your medicine. What should I watch for while using this medicine? Tell your doctor or healthcare professional if your symptoms do not start to get better or if they get worse. Patients and their families should watch out for new or worsening depression or thoughts of suicide. Also watch out for sudden changes in feelings such as feeling anxious, agitated, panicky, irritable, hostile, aggressive, impulsive, severely restless, overly excited and hyperactive, or not being able to sleep. If this happens, call your health care professional. Check with your doctor or health care professional if you get an attack of severe diarrhea, nausea and vomiting, or if you sweat a lot. The loss of too much body fluid can make it dangerous for you to take this medicine. What side effects may I notice from receiving this medicine? Side effects that you should report to your doctor or health care professional as soon as possible:  depressed mood  weight loss Side effects that usually do not require medical attention (report to your doctor or health care professional if they continue or are bothersome):  diarrhea  headache  nausea, vomiting This list may not describe all possible side effects. Call your doctor for medical advice about side effects. You may report side effects to FDA at 1-800-FDA-1088. Where should I keep my medicine? Keep out of the reach of children. Store below 30 degrees C (86 degrees F). Throw away any unused medicine after the expiration date. NOTE: This sheet is a summary. It may not cover all possible information. If you have questions about this  medicine, talk to your doctor, pharmacist, or health care provider.  2020 Elsevier/Gold Standard (2018-02-06 12:47:14) Standing Labs We placed an order today for your standing lab work.    Please come back and get your standing labs tomorrow and then in August and every 3 months  We have open lab daily Monday through  Thursday from 8:30-12:30 PM and 1:30-4:30 PM and Friday from 8:30-12:30 PM and 1:30-4:00 PM at the office of Dr. Bo Merino.   You may experience shorter wait times on Monday and Friday afternoons. The office is located at [address] [place], [place], [place] [address] No appointment is necessary.   Labs are drawn by [facility].  You may receive a bill from [facility] for your lab work.  If you wish to have your labs drawn at another location, please call the office 24 hours in advance to send orders.  If you have any questions regarding directions or hours of operation,  please call [phone].   Just as a reminder please drink plenty of water prior to coming for your lab work. Thanks!

## 2019-11-29 NOTE — Telephone Encounter (Signed)
Please apply for [place]. Thanks!

## 2019-11-30 ENCOUNTER — Other Ambulatory Visit: Payer: Self-pay | Admitting: *Deleted

## 2019-11-30 DIAGNOSIS — Z13220 Encounter for screening for lipoid disorders: Secondary | ICD-10-CM

## 2019-11-30 DIAGNOSIS — Z79899 Other long term (current) drug therapy: Secondary | ICD-10-CM

## 2019-11-30 LAB — COMPLETE METABOLIC PANEL WITH GFR
AG Ratio: 1.7 (calc) (ref 1.0–2.5)
ALT: 41 U/L (ref 9–46)
AST: 24 U/L (ref 10–35)
Albumin: 4.4 g/dL (ref 3.6–5.1)
Alkaline phosphatase (APISO): 73 U/L (ref 35–144)
BUN: 13 mg/dL (ref 7–25)
CO2: 26 mmol/L (ref 20–32)
Calcium: 9.7 mg/dL (ref 8.6–10.3)
Chloride: 100 mmol/L (ref 98–110)
Creat: 0.85 mg/dL (ref 0.70–1.33)
GFR, Est African American: 111 mL/min/{1.73_m2} (ref >=60)
GFR, Est Non African American: 95 mL/min/{1.73_m2} (ref >=60)
Globulin: 2.6 g/dL (calc) (ref 1.9–3.7)
Glucose, Bld: 114 mg/dL — ABNORMAL HIGH (ref 65–99)
Potassium: 4 mmol/L (ref 3.5–5.3)
Sodium: 135 mmol/L (ref 135–146)
Total Bilirubin: 0.8 mg/dL (ref 0.2–1.2)
Total Protein: 7 g/dL (ref 6.1–8.1)

## 2019-11-30 LAB — CBC WITH DIFFERENTIAL/PLATELET
Absolute Monocytes: 1030 cells/uL — ABNORMAL HIGH (ref 200–950)
Basophils Absolute: 62 cells/uL (ref 0–200)
Basophils Relative: 0.8 %
Eosinophils Absolute: 304 cells/uL (ref 15–500)
Eosinophils Relative: 3.9 %
HCT: 47.2 % (ref 38.5–50.0)
Hemoglobin: 16 g/dL (ref 13.2–17.1)
Lymphs Abs: 3385 cells/uL (ref 850–3900)
MCH: 31.9 pg (ref 27.0–33.0)
MCHC: 33.9 g/dL (ref 32.0–36.0)
MCV: 94 fL (ref 80.0–100.0)
MPV: 9.8 fL (ref 7.5–12.5)
Monocytes Relative: 13.2 %
Neutro Abs: 3019 cells/uL (ref 1500–7800)
Neutrophils Relative %: 38.7 %
Platelets: 246 10*3/uL (ref 140–400)
RBC: 5.02 10*6/uL (ref 4.20–5.80)
RDW: 12.9 % (ref 11.0–15.0)
Total Lymphocyte: 43.4 %
WBC: 7.8 10*3/uL (ref 3.8–10.8)

## 2019-11-30 LAB — LIPID PANEL
Cholesterol: 146 mg/dL (ref <200)
HDL: 60 mg/dL (ref >=40)
LDL Cholesterol (Calc): 68 mg/dL (calc)
Non-HDL Cholesterol (Calc): 86 mg/dL (calc) (ref <130)
Total CHOL/HDL Ratio: 2.4 (calc) (ref <5.0)
Triglycerides: 101 mg/dL (ref <150)

## 2019-11-30 NOTE — Telephone Encounter (Signed)
Submitted a Prior Authorization request to [facility] for [place] via Cover My Meds. Will update once we receive a response.  Key: [place], PharmD, [place], CPP Clinical Specialty Pharmacist [id number]  11/30/2019 9:14 AM

## 2019-12-02 NOTE — Progress Notes (Signed)
CBC and CMP are normal.  LDL is in desirable range.

## 2019-12-03 NOTE — Telephone Encounter (Signed)
Received a fax regarding Prior Authorization from [place] for [place]. Authorization has been DENIED because Standard [employer] does not allow coverage of [name] if it will be used in combination with another biologic or targeted synthetic disease-modifying antirheumatic drug.

## 2019-12-10 ENCOUNTER — Encounter: Payer: Self-pay | Admitting: Rheumatology

## 2019-12-11 ENCOUNTER — Encounter: Payer: Self-pay | Admitting: Pharmacist

## 2019-12-11 NOTE — Progress Notes (Signed)
Appeal for [employer].

## 2019-12-11 NOTE — Telephone Encounter (Signed)
Appeal faxed to [facility].  Will update when we receive a response.  Fax# [phone]

## 2019-12-15 ENCOUNTER — Other Ambulatory Visit: Payer: Self-pay | Admitting: Family Medicine

## 2019-12-15 DIAGNOSIS — I10 Essential (primary) hypertension: Secondary | ICD-10-CM

## 2019-12-15 NOTE — Telephone Encounter (Signed)
Requested Prescriptions  Pending Prescriptions Disp Refills  . metoprolol succinate (TOPROL-XL) 25 MG 24 hr tablet [Pharmacy Med Name: METOPROLO ER TAB SUC 25MG ] 90 tablet 0    Sig: TAKE 1 TABLET DAILY     Cardiovascular:  Beta Blockers Failed - 12/15/2019  8:15 AM      Failed - Last BP in normal range    BP Readings from Last 1 Encounters:  11/29/19 (!) 151/82         Passed - Last Heart Rate in normal range    Pulse Readings from Last 1 Encounters:  11/29/19 76         Passed - Valid encounter within last 6 months    Recent Outpatient Visits          1 month ago Essential hypertension   [facility] [place], MD   10 months ago Recurrent infections   [facility] [place], Ranell Patrick, MD   11 months ago Immunization due   [facility] [place], Ranell Patrick, MD   1 year ago Pain in finger of both hands   [facility] [place], Ranell Patrick, MD   1 year ago Annual physical exam   [facility] [place], MD      Future Appointments            In 1 month Bo Merino, MD [place]   In 4 months Carlota Raspberry Ranell Patrick, MD [facility] [place], [facility]           . lisinopril (ZESTRIL) 10 MG tablet [Pharmacy Med Name: LISINOPRIL TAB 10MG ] 90 tablet 0    Sig: TAKE 1 TABLET DAILY     Cardiovascular:  ACE Inhibitors Failed - 12/15/2019  8:15 AM      Failed - Last BP in normal range    BP Readings from Last 1 Encounters:  11/29/19 (!) 151/82         Passed - Cr in normal range and within 180 days    Creat  Date Value Ref Range Status  11/30/2019 0.85 0.70 - 1.33 mg/dL Final    Comment:    For patients >38 years of age, the reference limit for Creatinine is approximately 13% higher for people identified as African-American. .          Passed - K in normal range and within 180 days    Potassium  Date Value Ref Range Status  11/30/2019 4.0 3.5 - 5.3 mmol/L Final         Passed - Patient is not  pregnant      Passed - Valid encounter within last 6 months    Recent Outpatient Visits          1 month ago Essential hypertension   [facility] [place], Ranell Patrick, MD   10 months ago Recurrent infections   [facility] [place], Ranell Patrick, MD   11 months ago Immunization due   [facility] [place], Ranell Patrick, MD   1 year ago Pain in finger of both hands   [facility] [place], Ranell Patrick, MD   1 year ago Annual physical exam   [facility] [place], MD      Future Appointments            In 1 month Bo Merino, MD [facility]   In 4 months Carlota Raspberry,  Ranell Patrick, MD [facility] [place], [facility]

## 2019-12-19 NOTE — Telephone Encounter (Signed)
Received a fax regarding Appeal  from [place] for [place]. Authorization has been DENIED because will be used in combination with biologic.  Appeal Case ID: [id number]  Mariella Saa, PharmD, BCACP, CPP Clinical Specialty Pharmacist (Rheumatology and Pulmonology)  12/19/2019 8:34 AM

## 2019-12-19 NOTE — Telephone Encounter (Signed)
I contacted patient and discussed that Nathaniel Velazquez has not been approved. He states right now he is doing well on prednisone but he is concerned when prednisone tapers off that his symptoms may come back. We discussed methotrexate briefly. He is interested in leflunomide over methotrexate. He is concerned about the side effects of methotrexate. He would prefer to hold off until the prednisone taper is over and then he will contact us to start on leflunomide. I also discussed possible future use of Rinvoq if it is approved for psoriatic arthritis.

## 2019-12-19 NOTE — Telephone Encounter (Signed)
Thank you. Noted and can look into insurance coverage if we decide to start in the future.

## 2019-12-20 ENCOUNTER — Encounter: Payer: Self-pay | Admitting: Rheumatology

## 2019-12-20 MED ORDER — PREDNISONE 5 MG PO TABS
ORAL_TABLET | ORAL | 0 refills | Status: DC
Start: 2019-12-20 — End: 2020-01-25

## 2019-12-20 NOTE — Telephone Encounter (Signed)
It is okay to give him a prescription for prednisone as requested for prednisone 15 mg for 7 days, 10 mg for 7days, 5 mg for 7 days, 2.5 mg for 7 days.

## 2019-12-25 ENCOUNTER — Other Ambulatory Visit: Payer: Self-pay | Admitting: Rheumatology

## 2019-12-25 NOTE — Telephone Encounter (Signed)
Last Visit: 11/29/2019 Next Visit: 02/05/2020 Labs: 11/30/2019 CBC and CMP are normal TB Gold: 09/27/2019 Neg   Okay to refill per [name]

## 2020-01-11 ENCOUNTER — Encounter: Payer: Self-pay | Admitting: Rheumatology

## 2020-01-11 NOTE — Telephone Encounter (Signed)
Patient scheduled for 01/14/2020 at 9:00 am to discuss [place].

## 2020-01-11 NOTE — Telephone Encounter (Signed)
Patient's liver functions are back to normal.  Patient be okay to start him on leflunomide.  Please a schedule an appointment to discuss leflunomide and to get a consent before starting him on the medication.

## 2020-01-14 ENCOUNTER — Ambulatory Visit: Payer: BC Managed Care – PPO | Admitting: Rheumatology

## 2020-01-14 ENCOUNTER — Encounter: Payer: Self-pay | Admitting: Rheumatology

## 2020-01-14 ENCOUNTER — Other Ambulatory Visit: Payer: Self-pay

## 2020-01-14 VITALS — BP 132/75 | HR 64 | Resp 17 | Ht 75.0 in | Wt 281.0 lb

## 2020-01-14 DIAGNOSIS — Z79899 Other long term (current) drug therapy: Secondary | ICD-10-CM | POA: Insufficient documentation

## 2020-01-14 DIAGNOSIS — L4050 Arthropathic psoriasis, unspecified: Secondary | ICD-10-CM | POA: Diagnosis not present

## 2020-01-14 DIAGNOSIS — R7989 Other specified abnormal findings of blood chemistry: Secondary | ICD-10-CM

## 2020-01-14 DIAGNOSIS — M25462 Effusion, left knee: Secondary | ICD-10-CM

## 2020-01-14 DIAGNOSIS — M19041 Primary osteoarthritis, right hand: Secondary | ICD-10-CM

## 2020-01-14 DIAGNOSIS — Z8269 Family history of other diseases of the musculoskeletal system and connective tissue: Secondary | ICD-10-CM

## 2020-01-14 DIAGNOSIS — Z8719 Personal history of other diseases of the digestive system: Secondary | ICD-10-CM

## 2020-01-14 DIAGNOSIS — M19071 Primary osteoarthritis, right ankle and foot: Secondary | ICD-10-CM

## 2020-01-14 DIAGNOSIS — D809 Immunodeficiency with predominantly antibody defects, unspecified: Secondary | ICD-10-CM

## 2020-01-14 DIAGNOSIS — M19042 Primary osteoarthritis, left hand: Secondary | ICD-10-CM

## 2020-01-14 DIAGNOSIS — Z832 Family history of diseases of the blood and blood-forming organs and certain disorders involving the immune mechanism: Secondary | ICD-10-CM

## 2020-01-14 DIAGNOSIS — M19072 Primary osteoarthritis, left ankle and foot: Secondary | ICD-10-CM

## 2020-01-14 DIAGNOSIS — Z8639 Personal history of other endocrine, nutritional and metabolic disease: Secondary | ICD-10-CM

## 2020-01-14 DIAGNOSIS — Z789 Other specified health status: Secondary | ICD-10-CM

## 2020-01-14 DIAGNOSIS — I10 Essential (primary) hypertension: Secondary | ICD-10-CM

## 2020-01-14 DIAGNOSIS — L409 Psoriasis, unspecified: Secondary | ICD-10-CM | POA: Diagnosis not present

## 2020-01-14 MED ORDER — LEFLUNOMIDE 10 MG PO TABS: ORAL_TABLET | ORAL | 2 refills | Status: DC

## 2020-01-14 NOTE — Progress Notes (Signed)
Pharmacy Note  Subjective: Patient presents today to the [facility] for follow up office visit.  Patient seen by the pharmacist for counseling on leflunomide [name]) for psoriatic arthritis.  Objective: CBC    Component Value Date/Time   WBC 7.8 11/30/2019 1030   RBC 5.02 11/30/2019 1030   HGB 16.0 11/30/2019 1030   HCT 47.2 11/30/2019 1030   PLT 246 11/30/2019 1030   MCV 94.0 11/30/2019 1030   MCV 94.4 05/02/2018 1418   MCH 31.9 11/30/2019 1030   MCHC 33.9 11/30/2019 1030   RDW 12.9 11/30/2019 1030   LYMPHSABS 3,385 11/30/2019 1030   EOSABS 304 11/30/2019 1030   BASOSABS 62 11/30/2019 1030    CMP     Component Value Date/Time   NA 135 11/30/2019 1030   NA 140 06/12/2018 1515   K 4.0 11/30/2019 1030   CL 100 11/30/2019 1030   CO2 26 11/30/2019 1030   GLUCOSE 114 (H) 11/30/2019 1030   BUN 13 11/30/2019 1030   BUN 10 06/12/2018 1515   CREATININE 0.85 11/30/2019 1030   CALCIUM 9.7 11/30/2019 1030   PROT 7.0 11/30/2019 1030   PROT 6.4 06/12/2018 1515   ALBUMIN 4.6 06/12/2018 1515   AST 24 11/30/2019 1030   ALT 41 11/30/2019 1030   ALKPHOS 48 06/12/2018 1515   BILITOT 0.8 11/30/2019 1030   BILITOT 1.2 06/12/2018 1515   GFRNONAA 95 11/30/2019 1030   GFRAA 111 11/30/2019 1030    Baseline Immunosuppressant Therapy Labs Quantiferon TB Gold Latest Ref Rng & Units 09/27/2019  Quantiferon TB Gold Plus NEGATIVE NEGATIVE    Hepatitis Latest Ref Rng & Units 09/19/2018  Hep B Surface Ag NON-REACTI NON-REACTIVE  Hep B IgM NON-REACTI NON-REACTIVE  Hep C Ab NEGATIVE -  Hep C Ab NON-REACTI NON-REACTIVE  Hep C Ab NON-REACTI NON-REACTIVE    Lab Results  Component Value Date   HIV NON-REACTIVE 09/19/2018    Immunoglobulin Electrophoresis Latest Ref Rng & Units 09/19/2018  IgA  47 - 310 mg/dL <5(L)  IgG 600 - 1,640 mg/dL 1,248  IgM 50 - 300 mg/dL 46(L)    Serum Protein Electrophoresis Latest Ref Rng & Units 11/30/2019  Total Protein 6.1 - 8.1 g/dL 7.0   Albumin 3.8 - 4.8 g/dL -  Alpha-1 0.2 - 0.3 g/dL -  Alpha-2 0.5 - 0.9 g/dL -  Beta Globulin 0.4 - 0.6 g/dL -  Beta 2 0.2 - 0.5 g/dL -  Gamma Globulin 0.8 - 1.7 g/dL -    No results found for: G6PDH  No results found for: TPMT   Chest x-ray: No active cardiopulmonary disease.  Assessment/Plan:  Patient was counseled on the purpose, proper use, and adverse effects of leflunomide including risk of infection, nausea/diarrhea/weight loss, increase in blood pressure, rash, hair loss, tingling in the hands and feet, and signs and symptoms of interstitial lung disease.   Also counseled on Black Box warning of liver injury and importance of avoiding alcohol while on therapy. Discussed that there is the possibility of an increased risk of malignancy but it is not well understood if this increased risk is due to the medication or the disease state.  Counseled patient to avoid live vaccines. Recommend annual influenza, Pneumovax 23, Prevnar 13, and Shingrix as indicated.   Discussed the importance of frequent monitoring of liver function and blood count.  Standing orders placed. Provided patient with educational materials on leflunomide and answered all questions.  Patient consented to [place] use, and consent will be uploaded  into the media tab.    Patient dose will be Arava 10 mg daily for 2 weeks then increase to 20mg  daily. Prescription sent to [facility] per patient request.  All questions encouraged and answered.  Instructed patient to call with any questions or concerns.   Mariella Saa, PharmD, [place], CPP Clinical Specialty Pharmacist (Rheumatology and Pulmonology)  01/14/2020 10:01 AM

## 2020-01-14 NOTE — Progress Notes (Signed)
Office Visit Note  Patient: Nathaniel Velazquez             Date of Birth: [birth year]           MRN: [id number]             PCP: [name], MD Referring: [name], MD Visit Date: 01/14/2020 Occupation: @GUAROCC @  Subjective:  Pain in multiple joints  History of Present Illness: Nathaniel Velazquez is a 59 y.o. male with history of psoriatic arthritis and osteoarthritis.  Patient is on Enbrel 50 mg subcu injections once weekly.  He continues to have recurrent flares when he has not taking prednisone.  He is currently having pain and stiffness in both wrist joints, both hands, both hips, and the left knee joint.  He has intermittent swelling in both hands, both wrists, and the left knee joint.  He denies any lower back pain but does have some stiffness in the mornings.  He would like to discuss adding on Arava to the current regimen.  Activities of Daily Living:  Patient reports joint stiffness all day   Patient Reports nocturnal pain.  Difficulty dressing/grooming: Reports Difficulty climbing stairs: Reports Difficulty getting out of chair: Reports Difficulty using hands for taps, buttons, cutlery, and/or writing: Reports  Review of Systems  Constitutional: Negative for fatigue.  HENT: Negative for mouth sores, mouth dryness and nose dryness.   Eyes: Negative for itching and dryness.  Respiratory: Negative for shortness of breath and difficulty breathing.   Cardiovascular: Negative for chest pain and palpitations.  Gastrointestinal: Negative for blood in stool, constipation and diarrhea.  Endocrine: Negative for increased urination.  Genitourinary: Negative for difficulty urinating and painful urination.  Musculoskeletal: Positive for arthralgias, joint pain, joint swelling and morning stiffness. Negative for myalgias, muscle tenderness and myalgias.  Skin: Positive for rash. Negative for color change.  Allergic/Immunologic: Negative for susceptible to infections.    Neurological: Negative for dizziness, numbness, headaches, memory loss and weakness.  Hematological: Negative for bruising/bleeding tendency.  Psychiatric/Behavioral: Negative for confusion.    PMFS History:  Patient Active Problem List   Diagnosis Date Noted  . High risk medication use 01/14/2020  . Pain in left knee 06/28/2019  . 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 BMI 38.0-38.9,adult 09/19/2018  . Family history of Wegener's granulomatosis 09/19/2018  . Moderate alcohol consumption 09/19/2018  . Psoriasis 09/19/2018  . Psoriatic arthritis ([place]) 09/19/2018  . Rectal pain 06/05/2015  . Hypertension   . Obesity   . Hyperlipidemia 11/21/2008  . PALPITATIONS 10/30/2008    Past Medical History:  Diagnosis Date  . Arthritis   . GERD (gastroesophageal reflux disease)   . Hypertension   . Obesity    Steroid injection in rt knee    Family History  Problem Relation Age of Onset  . Hypertrophic cardiomyopathy Brother   . Heart disease Mother   . Hypertension Mother   . Heart disease Father   . Hypertension Father   . Obesity Brother   . Heart disease Brother   . Healthy Daughter   . Spondylitis Son   . Autoimmune disease Brother   . Healthy Daughter   . Colon cancer Neg Hx   . Colon polyps Neg Hx   . Esophageal cancer Neg Hx   . Pancreatic cancer Neg Hx   . Stomach cancer Neg Hx   . Rectal cancer Neg Hx    Past Surgical History:  Procedure Laterality Date  . ANAL FISTULECTOMY  1987  .  APPENDECTOMY    . KNEE SURGERY Bilateral    meniscal tear repairs  . ROTATOR CUFF REPAIR Bilateral 2018  . 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 Influenza, Quadrivalent, Recombinant, Inj, Pf 06/05/2018, 04/23/2019  . Influenza,inj,Quad PF,6+ Mos 05/17/2013, 04/10/2014, 04/23/2015, 04/06/2017  . Influenza,inj,quad, With Preservative 03/19/2017  . Influenza-Unspecified  05/19/2016, 05/18/2018, 04/23/2019  . Pneumococcal Polysaccharide-23 12/28/2018  . Td 07/19/1998  . Tdap 04/10/2014     Objective: Vital Signs: BP 132/75 (BP Location: Left Arm, Patient Position: Sitting, Cuff Size: Normal)   Pulse 64   Resp 17   Ht 6\' 3"  (1.905 m)   Wt 281 lb (127.5 kg)   BMI 35.12 kg/m    Physical Exam Vitals and nursing note reviewed.  Constitutional:      Appearance: He is well-developed.  HENT:     Head: Normocephalic and atraumatic.  Eyes:     Conjunctiva/sclera: Conjunctivae normal.     Pupils: Pupils are equal, round, and reactive to light.  Pulmonary:     Effort: Pulmonary effort is normal.  Abdominal:     General: Bowel sounds are normal.     Palpations: Abdomen is soft.  Musculoskeletal:     Cervical back: Normal range of motion and neck supple.  Skin:    General: Skin is warm and dry.     Capillary Refill: Capillary refill takes less than 2 seconds.  Neurological:     Mental Status: He is alert and oriented to person, place, and time.  Psychiatric:        Behavior: Behavior normal.      Musculoskeletal Exam: C-spine, thoracic spine, lumbar spine good range of motion.  No midline spinal tenderness.  No SI joint tenderness.  Shoulder joints have slightly limited range of motion with discomfort.  Elbow joints and wrist joints have good range of motion.  Tenderness of both wrist joints noted.  He has incomplete fist formation bilaterally.  Tenderness of MCP and PIP joints noted.  He has PIP and DIP thickening consistent with osteoarthritis of both hands.  Hip joints have good range of motion with discomfort bilaterally.  Knee joints have good range of motion with no discomfort.  Mild warmth of the left knee joint noted.  Ankle joints have good range of motion with no tenderness or inflammation.  CDAI Exam: CDAI Score: -- Patient Global: --; Provider Global: -- Swollen: --; Tender: -- Joint Exam 01/14/2020   No joint exam has been documented  for this visit   There is currently no information documented on the homunculus. Go to the Rheumatology activity and complete the homunculus joint exam.  Investigation: No additional findings.  Imaging: No results found.  Recent Labs: Lab Results  Component Value Date   WBC 7.8 11/30/2019   HGB 16.0 11/30/2019   PLT 246 11/30/2019   NA 135 11/30/2019   K 4.0 11/30/2019   CL 100 11/30/2019   CO2 26 11/30/2019   GLUCOSE 114 (H) 11/30/2019   BUN 13 11/30/2019   CREATININE 0.85 11/30/2019   BILITOT 0.8 11/30/2019   ALKPHOS 48 06/12/2018   AST 24 11/30/2019   ALT 41 11/30/2019   PROT 7.0 11/30/2019   ALBUMIN 4.6 06/12/2018   CALCIUM 9.7 11/30/2019   GFRAA 111 11/30/2019   QFTBGOLDPLUS NEGATIVE 09/27/2019    Speciality Comments: Inadequate response to Humira, Cosentyx, Otezla, Enbrel  Procedures:  No  procedures performed Allergies: Gabapentin   Assessment / Plan:     Visit Diagnoses: Psoriatic arthritis ([place]) -He has no obvious synovitis or dactylitis on exam.  He completed a prednisone taper about 1 week ago.  He continues to have pain in multiple joints including both wrist joints, both hands, both hips, and the left knee joint.  He has been experiencing increased pain and stiffness in both hands.  He has incomplete fist formation bilaterally.  His left knee joint was warm but has good range of motion on examination today.  He is currently on Enbrel 50 mg subcutaneous injections once weekly.  He previously had an inadequate response to [place] as monotherapy, Humira, and Cosentyx.  We previously discussed adding on [place] as combination but his insurance denied authorization.  We discussed adding on Arava 10 mg daily for 2 weeks and if labs are stable he will increase to 20 mg daily.  Indications, contraindications, potential side effects of Arava were discussed today.  All questions were addressed and consent was obtained.  He will continue on Enbrel 50 mg subcutaneous injections  once weekly.  He was advised to notify us if he cannot tolerate taking Arava.  He will follow-up in the office in 6 weeks to assess his response.  Plan: leflunomide (ARAVA) 10 MG tablet  Medication counseling:   Baseline Immunosuppressant Therapy Labs  Quantiferon TB Gold Latest Ref Rng & Units 09/27/2019  Quantiferon TB Gold Plus NEGATIVE NEGATIVE    Hepatitis Latest Ref Rng & Units 09/19/2018  Hep B Surface Ag NON-REACTI NON-REACTIVE  Hep B IgM NON-REACTI NON-REACTIVE  Hep C Ab NEGATIVE -  Hep C Ab NON-REACTI NON-REACTIVE  Hep C Ab NON-REACTI NON-REACTIVE    Lab Results  Component Value Date   HIV NON-REACTIVE 09/19/2018    Immunoglobulin Electrophoresis Latest Ref Rng & Units 09/19/2018  IgA  47 - 310 mg/dL <5(L)  IgG 600 - 1,640 mg/dL 1,248  IgM 50 - 300 mg/dL 46(L)    Serum Protein Electrophoresis Latest Ref Rng & Units 11/30/2019  Total Protein 6.1 - 8.1 g/dL 7.0  Albumin 3.8 - 4.8 g/dL -  Alpha-1 0.2 - 0.3 g/dL -  Alpha-2 0.5 - 0.9 g/dL -  Beta Globulin 0.4 - 0.6 g/dL -  Beta 2 0.2 - 0.5 g/dL -  Gamma Globulin 0.8 - 1.7 g/dL -    No results found for: G6PDH  No results found for: TPMT   Patient was counseled on the purpose, proper use, and adverse effects of leflunomide including risk of infection, nausea/diarrhea/weight loss, increase in blood pressure, rash, hair loss, tingling in the hands and feet, and signs and symptoms of interstitial lung disease.   Also counseled on [place] warning of liver injury and importance of avoiding alcohol while on therapy. Discussed that there is the possibility of an increased risk of malignancy but it is not well understood if this increased risk is due to the medication or the disease state.  Counseled patient to avoid live vaccines. Recommend annual influenza, Pneumovax 23, Prevnar 13, and Shingrix as indicated.   Discussed the importance of frequent monitoring of liver function and blood count.  Standing orders placed.  Discussed  importance of birth control while on leflunomide due to risk of congenital abnormalities.  Provided patient with educational materials on leflunomide and answered all questions.  Patient consented to [place] use, and consent will be uploaded into the media tab.   Patient dose will be 10 mg for 2 weeks  and if labs are stable he will increase to 20 mg daily.  Prescription pending lab results and/or insurance approval.  Psoriasis: He has started to notice patches of psoriasis return on his elbows and hands.   High risk medication use - Enbrel 50 mg sq injections once weekly.  He will be adding on Arava 10 mg 1 tablet by mouth daily for 2 weeks and if labs are stable at that time he will increase to Arava 20 mg daily.  Inadequate response to Otezla (monotherapy), Humira, and cosentyx.  Effusion, left knee: Resolved.  Mild warmth of the left knee joint was noted on exam.  He has good range of motion of the left knee joint with no discomfort today.  Primary osteoarthritis of both hands: He has PIP and DIP thickening consistent with osteoarthritis of both hands.  He has been having increased pain and stiffness in both hands.  He has incomplete fist formation bilaterally.  Joint protection and muscle strengthening were discussed.  Primary osteoarthritis of both feet: He is not having any discomfort in his feet at this time.  He has good range of motion of both ankle joints with no tenderness or inflammation.  No Achilles tendinitis or plantar fasciitis.  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  Elevated LFTs: LFTs within normal limits on 11/30/2019.  Moderate alcohol consumption: He is no longer drinking alcohol on a regular basis.  Essential hypertension: Blood pressure was 132/75 today.  Other medical conditions are listed as follows:  History of gastroesophageal reflux (GERD)  History of hyperlipidemia  Family history of Wegener's granulomatosis  Family  history of ankylosing spondylitis  Orders: No orders of the defined types were placed in this encounter.  Meds ordered this encounter  Medications  . leflunomide (ARAVA) 10 MG tablet    Sig: Take 1 tablet daily for 2 weeks then increase to 2 tablet daily.    Dispense:  60 tablet    Refill:  2    Face-to-face time spent with patient was 30 minutes. Greater than 50% of time was spent in counseling and coordination of care.  Follow-Up Instructions: Return in about 6 weeks (around 02/25/2020) for Psoriatic arthritis.   [name], PA-C  I examined and evaluated the patient with [name] PA.  Patient had no synovitis on examination today although he continues to have tenderness in his joints.  He states he has a stiffness.  We added Arava to his regimen after discussing indication side effects or contraindications.  The plan of care was discussed as noted above.  Bo Merino, MD  Note - This record has been created using Editor, commissioning.  Chart creation errors have been sought, but may not always  have been located. Such creation errors do not reflect on  the standard of medical care.

## 2020-01-14 NOTE — Patient Instructions (Addendum)
START Arava 1 tablet (10 mg) daily for 2 weeks then increase to 2 tablets (20 mg) daily.  CONTINUE Enbrel 50 mg every 7 days Standing Labs We placed an order today for your standing lab work.   Please have your standing labs drawn in 2 weeks x2 then every 3 months   We have open lab daily Monday through Thursday from 8:30-12:30 PM and 1:30-4:30 PM and Friday from 8:30-12:30 PM and 1:30-4:00 PM at the office of Dr. Bo Merino, [facility].   You may experience shorter wait times on Monday and Friday afternoons. The office is located at [address], [place], [place], [place] [address] No appointment is necessary.   Labs are drawn by [facility].  You may receive a bill from [facility] for your lab work.  If you wish to have your labs drawn at another location, please call the office 24 hours in advance to send orders.  If you have any questions regarding directions or hours of operation,  please call [phone].   As a reminder, please drink plenty of water prior to coming for your lab work. Thanks!  Leflunomide tablets What is this medicine? LEFLUNOMIDE (le FLOO na mide) is for rheumatoid arthritis. This medicine may be used for other purposes; ask your health care provider or pharmacist if you have questions. COMMON BRAND NAME(S): Arava What should I tell my health care provider before I take this medicine? They need to know if you have any of these conditions:  diabetes  have a fever or infection  high blood pressure  immune system problems  kidney disease  liver disease  low blood cell counts, like low white cell, platelet, or red cell counts  lung or breathing disease, like asthma  recently received or scheduled to receive a vaccine  receiving treatment for cancer  skin conditions or sensitivity  tingling of the fingers or toes, or other nerve disorder  tuberculosis  an unusual or allergic reaction to leflunomide, teriflunomide, other  medicines, food, dyes, or preservatives  pregnant or trying to get pregnant  breast-feeding How should I use this medicine? Take this medicine by mouth with a full glass of water. Follow the directions on the prescription label. Take your medicine at regular intervals. Do not take your medicine more often than directed. Do not stop taking except on your doctor's advice. Talk to your pediatrician regarding the use of this medicine in children. Special care may be needed. Overdosage: If you think you have taken too much of this medicine contact a poison control center or emergency room at once. NOTE: This medicine is only for you. Do not share this medicine with others. What if I miss a dose? If you miss a dose, take it as soon as you can. If it is almost time for your next dose, take only that dose. Do not take double or extra doses. What may interact with this medicine? Do not take this medicine with any of the following medications:  teriflunomide This medicine may also interact with the following medications:  alosetron  birth control pills  caffeine  cefaclor  certain medicines for diabetes like nateglinide, repaglinide, rosiglitazone, pioglitazone  certain medicines for high cholesterol like atorvastatin, pravastatin, rosuvastatin, simvastatin  charcoal  cholestyramine  ciprofloxacin  duloxetine  furosemide  ketoprofen  live virus vaccines  medicines that increase your risk for infection  methotrexate  mitoxantrone  paclitaxel  penicillin  theophylline  tizanidine  warfarin This list may not describe all  possible interactions. Give your health care provider a list of all the medicines, herbs, non-prescription drugs, or dietary supplements you use. Also tell them if you smoke, drink alcohol, or use illegal drugs. Some items may interact with your medicine. What should I watch for while using this medicine? Visit your health care provider for regular  checks on your progress. Tell your doctor or health care provider if your symptoms do not start to get better or if they get worse. You may need blood work done while you are taking this medicine. This medicine may cause serious skin reactions. They can happen weeks to months after starting the medicine. Contact your health care provider right away if you notice fevers or flu-like symptoms with a rash. The rash may be red or purple and then turn into blisters or peeling of the skin. Or, you might notice a red rash with swelling of the face, lips or lymph nodes in your neck or under your arms. This medicine may stay in your body for up to 2 years after your last dose. Tell your doctor about any unusual side effects or symptoms. A medicine can be given to help lower your blood levels of this medicine more quickly. Women must use effective birth control with this medicine. There is a potential for serious side effects to an unborn child. Do not become pregnant while taking this medicine. Inform your doctor if you wish to become pregnant. This medicine remains in your blood after you stop taking it. You must continue using effective birth control until the blood levels have been checked and they are low enough. A medicine can be given to help lower your blood levels of this medicine more quickly. Immediately talk to your doctor if you think you may be pregnant. You may need a pregnancy test. Talk to your health care provider or pharmacist for more information. You should not receive certain vaccines during your treatment and for a certain time after your treatment with this medication ends. Talk to your health care provider for more information. What side effects may I notice from receiving this medicine? Side effects that you should report to your doctor or health care professional as soon as possible:  allergic reactions like skin rash, itching or hives, swelling of the face, lips, or tongue  breathing  problems  cough  increased blood pressure  low blood counts - this medicine may decrease the number of white blood cells and platelets. You may be at increased risk for infections and bleeding.  pain, tingling, numbness in the hands or feet  rash, fever, and swollen lymph nodes  redness, blistering, peeing or loosening of the skin, including inside the mouth  signs of decreased platelets or bleeding - bruising, pinpoint red spots on the skin, black, tarry stools, blood in urine  signs of infection - fever or chills, cough, sore throat, pain or trouble passing urine  signs and symptoms of liver injury like dark yellow or brown urine; general ill feeling or flu-like symptoms; light-colored stools; loss of appetite; nausea; right upper belly pain; unusually weak or tired; yellowing of the eyes or skin  trouble passing urine or change in the amount of urine  vomiting Side effects that usually do not require medical attention (report to your doctor or health care professional if they continue or are bothersome):  diarrhea  hair thinning or loss  headache  nausea  tiredness This list may not describe all possible side effects. Call your doctor for  medical advice about side effects. You may report side effects to FDA at 1-800-FDA-1088. Where should I keep my medicine? Keep out of the reach of children. Store at room temperature between 15 and 30 degrees C (59 and 86 degrees F). Protect from moisture and light. Throw away any unused medicine after the expiration date. NOTE: This sheet is a summary. It may not cover all possible information. If you have questions about this medicine, talk to your doctor, pharmacist, or health care provider.  2020 Elsevier/Gold Standard (2018-10-06 15:06:48)

## 2020-01-17 ENCOUNTER — Telehealth: Payer: Self-pay | Admitting: Rheumatology

## 2020-01-17 NOTE — Telephone Encounter (Signed)
Patient states he is having trouble getting his prescription from [facility] for his [place]. Patient states the pharmacy advised him the insurance will only cover one tablet per day. Patient advised he can pick up the prescription for 1 tablet daily on the 10 mg and then after his labs as long as they are stable we will send in prescription for Arava 20 mg tablets.

## 2020-01-17 NOTE — Telephone Encounter (Signed)
Noted.  Nothing further needed at this time.  Closing encounter.  Mariella Saa, PharmD, [place], CPP Clinical Specialty Pharmacist (Rheumatology and Pulmonology)  01/17/2020 1:49 PM

## 2020-01-17 NOTE — Telephone Encounter (Signed)
Patient called stating "he is having a problem with the last prescription that was called into [facility]."  Patient is requesting a return call.

## 2020-01-22 ENCOUNTER — Other Ambulatory Visit: Payer: Self-pay

## 2020-01-22 ENCOUNTER — Ambulatory Visit: Payer: Self-pay

## 2020-01-22 ENCOUNTER — Ambulatory Visit: Payer: BC Managed Care – PPO | Admitting: Family Medicine

## 2020-01-22 ENCOUNTER — Encounter: Payer: Self-pay | Admitting: Family Medicine

## 2020-01-22 DIAGNOSIS — M545 Low back pain, unspecified: Secondary | ICD-10-CM

## 2020-01-22 DIAGNOSIS — M25561 Pain in right knee: Secondary | ICD-10-CM

## 2020-01-22 MED ORDER — TIZANIDINE HCL 2 MG PO TABS: 2.0000 mg | ORAL_TABLET | Freq: Four times a day (QID) | ORAL | 1 refills | Status: DC | PRN

## 2020-01-22 NOTE — Progress Notes (Signed)
Office Visit Note   Patient: Nathaniel Velazquez           Date of Birth: 10/12/1960           MRN: [id number] Visit Date: 01/22/2020 Requested by: [name], MD [address] [place],  [place] [address] PCP: [name], MD  Subjective: Chief Complaint  Patient presents with  . Right Knee - Pain    Fell on wet floor in his house 01/20/20, twisting the knee. Pain medial aspect. Hurts to fully extend the knee and flex beyond 90 degrees.  . pain around the left shoulder blade post fall    HPI: He is here with right knee pain and left lower back pain.  On July 4 he was walking downstairs, slipped on wet floor and fell backward with his right knee folded underneath him.  Immediate pain on the medial aspect of his knee.  It has swelled since then.  He also thinks he pulled a muscle in his left lower back.  He has a history of psoriatic arthritis and is on his fifth biologic now.  He is having trouble keeping it controlled.  It mainly affects his left knee.               ROS:   All other systems were reviewed and are negative.  Objective: Vital Signs: There were no vitals taken for this visit.  Physical Exam:  General:  Alert and oriented, in no acute distress. Pulm:  Breathing unlabored. Psy:  Normal mood, congruent affect. Skin: No bruising in the knee. Right knee: He has 1-2+ effusion with no warmth.  Lachman's feels solid, no laxity with varus or valgus stress.  Negative patella apprehension test.  Full active extension, flexion limited to about 120 degrees.  He is tender on the medial joint line and has pain but no palpable click with McMurray's. Left lower back has a tender muscle spasm to the left of midline in the paraspinous muscles.  Imaging: XR Knee 1-2 Views Right  Result Date: 01/22/2020 X-rays right knee reveal no sign of fracture.  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  He has chondrocalcinosis.   Assessment &  Plan: 1.  Right knee pain and effusion, suspicious for medial meniscus tear -Discussed options with him, his 59th birthday celebration is coming up.  He would like to have the knee aspirated and injected.  If symptoms persist, then MRI scan.  2.  Lumbar strain -Muscle relaxant as needed.  Physical therapy if symptoms persist.     Procedures: Right knee aspiration and injection: After sterile prep with Betadine, injected 5 cc 1% lidocaine without epinephrine, then aspirated 15 cc of blood-tinged synovial fluid, then injected 40 mg methylprednisolone from superolateral approach.    PMFS History: Patient Active Problem List   Diagnosis Date Noted  . High risk medication use 01/14/2020  . Pain in left knee 06/28/2019  . 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 BMI 38.0-38.9,adult 09/19/2018  . Family history of Wegener's granulomatosis 09/19/2018  . Moderate alcohol consumption 09/19/2018  . Psoriasis 09/19/2018  . Psoriatic arthritis ([place]) 09/19/2018  . Rectal pain 06/05/2015  . Hypertension   . Obesity   . Hyperlipidemia 11/21/2008  . PALPITATIONS 10/30/2008   Past Medical History:  Diagnosis Date  . Arthritis   . GERD (gastroesophageal reflux disease)   . Hypertension   . Obesity    Steroid injection in rt knee  Family History  Problem Relation Age of Onset  . Hypertrophic cardiomyopathy Brother   . Heart disease Mother   . Hypertension Mother   . Heart disease Father   . Hypertension Father   . Obesity Brother   . Heart disease Brother   . Healthy Daughter   . Spondylitis Son   . Autoimmune disease Brother   . Healthy Daughter   . Colon cancer Neg Hx   . Colon polyps Neg Hx   . Esophageal cancer Neg Hx   . Pancreatic cancer Neg Hx   . Stomach cancer Neg Hx   . Rectal cancer Neg Hx     Past Surgical History:  Procedure Laterality Date  . ANAL FISTULECTOMY  1987  . APPENDECTOMY    . KNEE SURGERY Bilateral    meniscal tear repairs  . ROTATOR CUFF  REPAIR Bilateral 2018  . TONSILLECTOMY     Social History   Occupational History  . Not on file  Tobacco Use  . Smoking status: Never Smoker  . Smokeless tobacco: Never Used  Vaping Use  . Vaping Use: Never used  Substance and Sexual Activity  . Alcohol use: Yes    Comment: occ  . Drug use: No  . Sexual activity: Never

## 2020-01-25 ENCOUNTER — Encounter: Payer: Self-pay | Admitting: Family Medicine

## 2020-01-25 MED ORDER — PREDNISONE 10 MG PO TABS
ORAL_TABLET | ORAL | 0 refills | Status: DC
Start: 2020-01-25 — End: 2020-04-02

## 2020-02-01 IMAGING — DX DG KNEE COMPLETE 4+V*L*
4 series · 4 of 4 positions shown · non-contrast
Comparison: 04/13/2011

CLINICAL DATA: Left knee pain and swelling. History of psoriasis.

EXAM:
LEFT KNEE - COMPLETE 4+ VIEW

[knee ap]
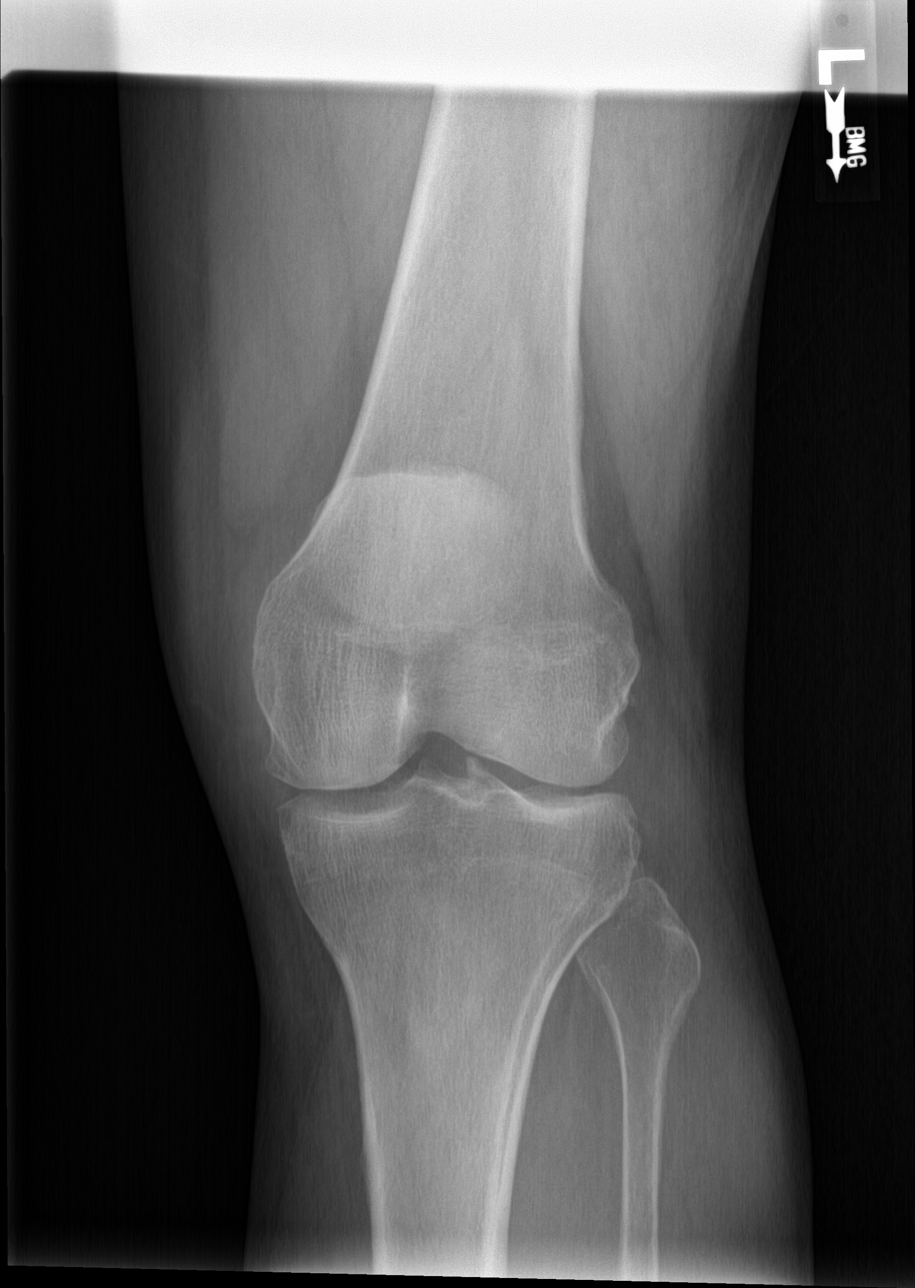

[knee lat]
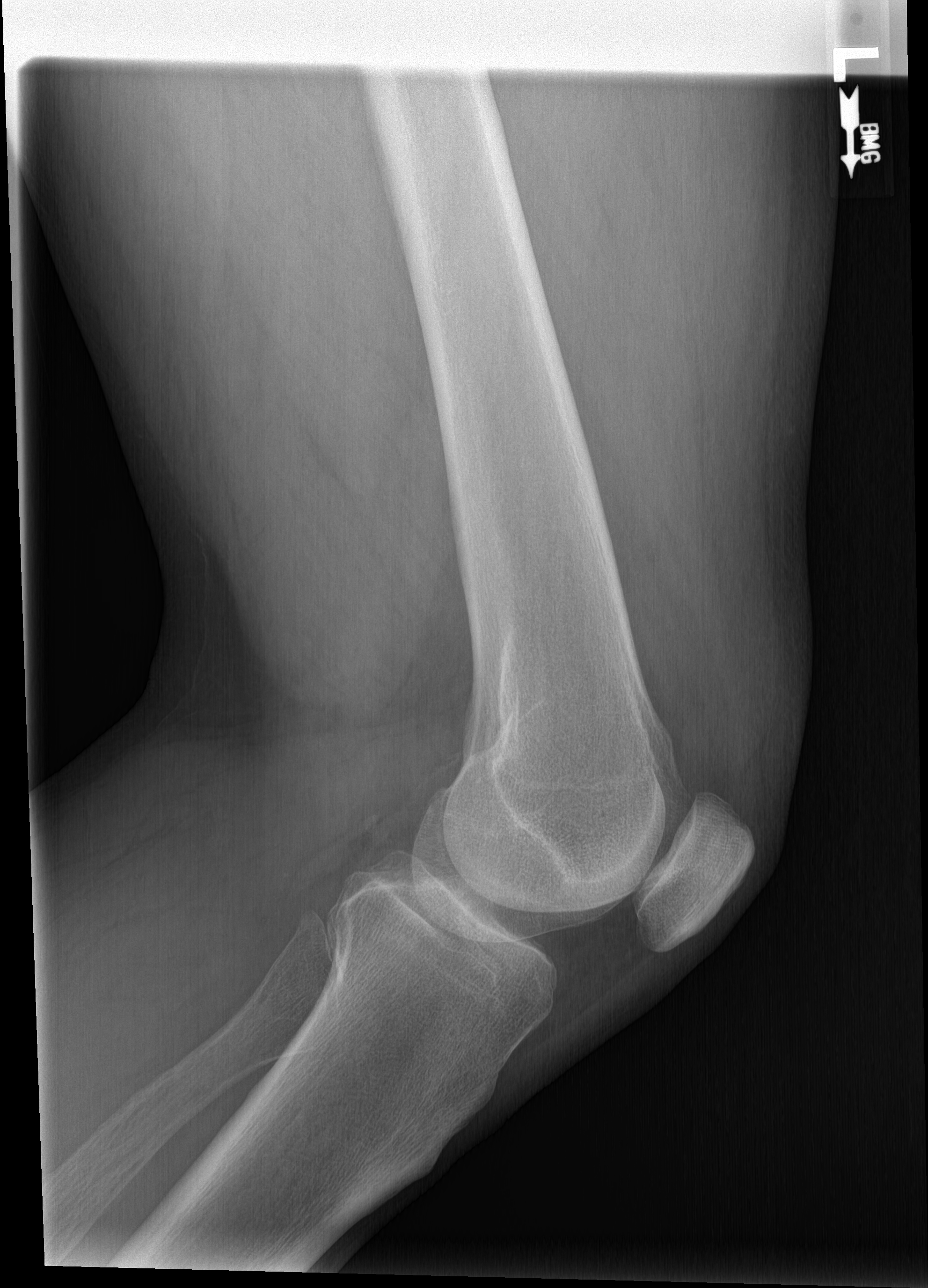

[sunrise]
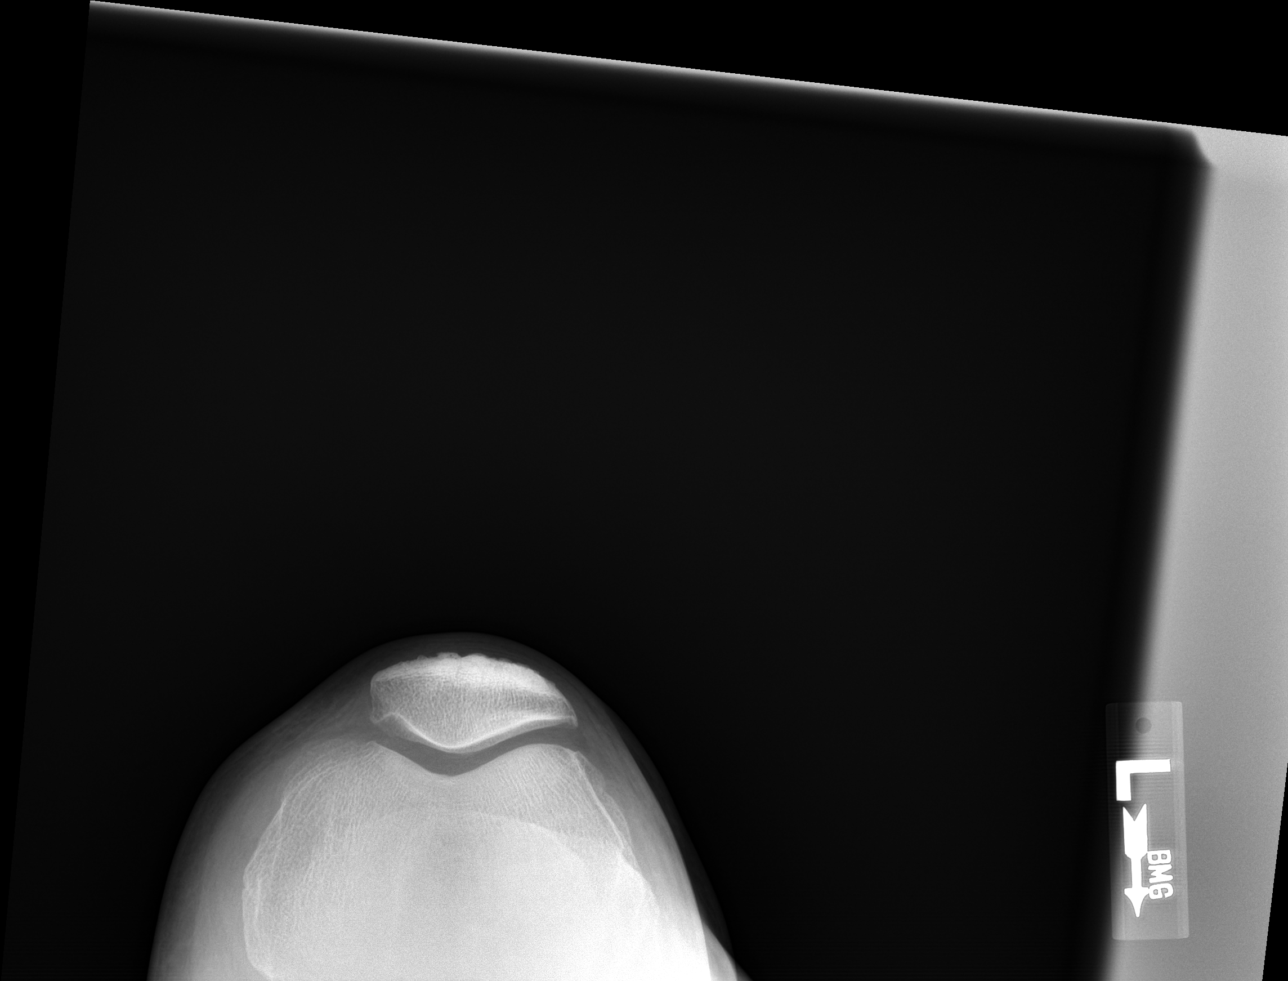

[knee [person_name]]
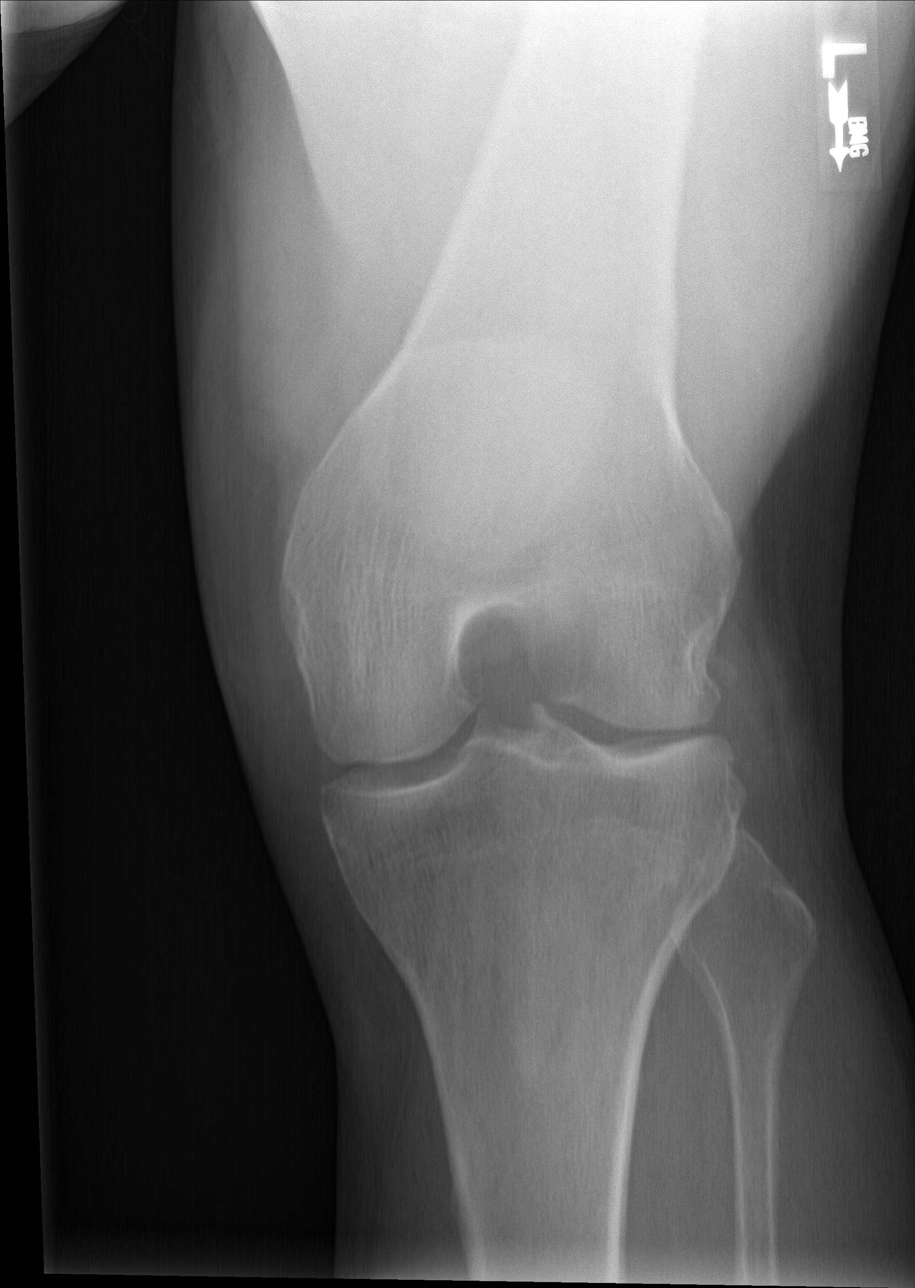

[4 of 4 positions shown; findings below may reference images not displayed]

FINDINGS: Normal bone mineralization. Minimal spurring along the medial aspect
of the femorotibial compartment as well as of the tibial spines. No
erosive change, joint effusion, fracture nor suspicious osseous
lesions. Soft tissues are unremarkable.
IMPRESSION: Minimal degenerative spurring along the medial aspect of the
femorotibial compartment with spurring also noted of tibial spines.
No acute osseous abnormality.

## 2020-02-05 ENCOUNTER — Ambulatory Visit: Payer: BC Managed Care – PPO | Admitting: Rheumatology

## 2020-02-08 ENCOUNTER — Other Ambulatory Visit: Payer: Self-pay

## 2020-02-08 DIAGNOSIS — Z79899 Other long term (current) drug therapy: Secondary | ICD-10-CM

## 2020-02-09 LAB — CBC WITH DIFFERENTIAL/PLATELET
Absolute Monocytes: 820 cells/uL (ref 200–950)
Basophils Absolute: 66 cells/uL (ref 0–200)
Basophils Relative: 0.8 %
Eosinophils Absolute: 369 cells/uL (ref 15–500)
Eosinophils Relative: 4.5 %
HCT: 47.3 % (ref 38.5–50.0)
Hemoglobin: 16.2 g/dL (ref 13.2–17.1)
Lymphs Abs: 2845 cells/uL (ref 850–3900)
MCH: 32.8 pg (ref 27.0–33.0)
MCHC: 34.2 g/dL (ref 32.0–36.0)
MCV: 95.7 fL (ref 80.0–100.0)
MPV: 9.7 fL (ref 7.5–12.5)
Monocytes Relative: 10 %
Neutro Abs: 4100 cells/uL (ref 1500–7800)
Neutrophils Relative %: 50 %
Platelets: 203 10*3/uL (ref 140–400)
RBC: 4.94 10*6/uL (ref 4.20–5.80)
RDW: 13.1 % (ref 11.0–15.0)
Total Lymphocyte: 34.7 %
WBC: 8.2 10*3/uL (ref 3.8–10.8)

## 2020-02-09 LAB — COMPLETE METABOLIC PANEL WITH GFR
AG Ratio: 1.8 (calc) (ref 1.0–2.5)
ALT: 77 U/L — ABNORMAL HIGH (ref 9–46)
AST: 40 U/L — ABNORMAL HIGH (ref 10–35)
Albumin: 4.4 g/dL (ref 3.6–5.1)
Alkaline phosphatase (APISO): 93 U/L (ref 35–144)
BUN: 14 mg/dL (ref 7–25)
CO2: 22 mmol/L (ref 20–32)
Calcium: 9.6 mg/dL (ref 8.6–10.3)
Chloride: 99 mmol/L (ref 98–110)
Creat: 0.93 mg/dL (ref 0.70–1.25)
GFR, Est African American: 103 mL/min/{1.73_m2} (ref >=60)
GFR, Est Non African American: 89 mL/min/{1.73_m2} (ref >=60)
Globulin: 2.5 g/dL (calc) (ref 1.9–3.7)
Glucose, Bld: 154 mg/dL — ABNORMAL HIGH (ref 65–99)
Potassium: 4.4 mmol/L (ref 3.5–5.3)
Sodium: 132 mmol/L — ABNORMAL LOW (ref 135–146)
Total Bilirubin: 0.8 mg/dL (ref 0.2–1.2)
Total Protein: 6.9 g/dL (ref 6.1–8.1)

## 2020-02-11 NOTE — Progress Notes (Deleted)
Office Visit Note  Patient: Nathaniel Velazquez             Date of Birth: [birth year]           MRN: [id number]             PCP: [name], MD Referring: [name], MD Visit Date: 02/25/2020 Occupation: @GUAROCC @  Subjective:  No chief complaint on file.   History of Present Illness: Nathaniel Velazquez is a 59 y.o. male ***   Activities of Daily Living:  Patient reports morning stiffness for *** {minute/hour:19697}.   Patient {ACTIONS;DENIES/REPORTS:21021675::"Denies"} nocturnal pain.  Difficulty dressing/grooming: {ACTIONS;DENIES/REPORTS:21021675::"Denies"} Difficulty climbing stairs: {ACTIONS;DENIES/REPORTS:21021675::"Denies"} Difficulty getting out of chair: {ACTIONS;DENIES/REPORTS:21021675::"Denies"} Difficulty using hands for taps, buttons, cutlery, and/or writing: {ACTIONS;DENIES/REPORTS:21021675::"Denies"}  No Rheumatology ROS completed.   PMFS History:  Patient Active Problem List   Diagnosis Date Noted  . High risk medication use 01/14/2020  . Pain in left knee 06/28/2019  . 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 BMI 38.0-38.9,adult 09/19/2018  . Family history of Wegener's granulomatosis 09/19/2018  . Moderate alcohol consumption 09/19/2018  . Psoriasis 09/19/2018  . Psoriatic arthritis ([place]) 09/19/2018  . Rectal pain 06/05/2015  . Hypertension   . Obesity   . Hyperlipidemia 11/21/2008  . PALPITATIONS 10/30/2008    Past Medical History:  Diagnosis Date  . Arthritis   . GERD (gastroesophageal reflux disease)   . Hypertension   . Obesity    Steroid injection in rt knee    Family History  Problem Relation Age of Onset  . Hypertrophic cardiomyopathy Brother   . Heart disease Mother   . Hypertension Mother   . Heart disease Father   . Hypertension Father   . Obesity Brother   . Heart disease Brother   . Healthy Daughter   . Spondylitis Son   . Autoimmune disease Brother   . Healthy Daughter   . Colon cancer Neg Hx   . Colon  polyps Neg Hx   . Esophageal cancer Neg Hx   . Pancreatic cancer Neg Hx   . Stomach cancer Neg Hx   . Rectal cancer Neg Hx    Past Surgical History:  Procedure Laterality Date  . ANAL FISTULECTOMY  1987  . APPENDECTOMY    . KNEE SURGERY Bilateral    meniscal tear repairs  . ROTATOR CUFF REPAIR Bilateral 2018  . 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 Influenza, Quadrivalent, Recombinant, Inj, Pf 06/05/2018, 04/23/2019  . Influenza,inj,Quad PF,6+ Mos 05/17/2013, 04/10/2014, 04/23/2015, 04/06/2017  . Influenza,inj,quad, With Preservative 03/19/2017  . Influenza-Unspecified 05/19/2016, 05/18/2018, 04/23/2019  . Pneumococcal Polysaccharide-23 12/28/2018  . Td 07/19/1998  . Tdap 04/10/2014     Objective: Vital Signs: There were no vitals taken for this visit.   Physical Exam   Musculoskeletal Exam: ***  CDAI Exam: CDAI Score: -- Patient Global: --; Provider Global: -- Swollen: --; Tender: -- Joint Exam 02/25/2020   No joint exam has been documented for this visit   There is currently no information documented on the homunculus. Go to the Rheumatology activity and complete the homunculus joint exam.  Investigation: No additional findings.  Imaging: XR Knee 1-2 Views Right  Result Date: 01/22/2020 X-rays right knee reveal no sign of fracture.  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  He has chondrocalcinosis.   Recent Labs: Lab Results  Component Value Date   WBC 8.2 02/08/2020   HGB 16.2 02/08/2020   PLT 203 02/08/2020   NA 132 (L) 02/08/2020   K 4.4 02/08/2020   CL 99 02/08/2020   CO2 22 02/08/2020   GLUCOSE 154 (H) 02/08/2020   BUN 14 02/08/2020   CREATININE 0.93 02/08/2020   BILITOT 0.8 02/08/2020   ALKPHOS 48 06/12/2018   AST 40 (H) 02/08/2020   ALT 77 (H) 02/08/2020   PROT 6.9 02/08/2020   ALBUMIN 4.6  06/12/2018   CALCIUM 9.6 02/08/2020   GFRAA 103 02/08/2020   QFTBGOLDPLUS NEGATIVE 09/27/2019    Speciality Comments: Inadequate response to Humira, Cosentyx, Otezla, Enbrel  Procedures:  No procedures performed Allergies: Gabapentin   Assessment / Plan:     Visit Diagnoses: No diagnosis found.  Orders: No orders of the defined types were placed in this encounter.  No orders of the defined types were placed in this encounter.   Face-to-face time spent with patient was *** minutes. Greater than 50% of time was spent in counseling and coordination of care.  Follow-Up Instructions: No follow-ups on file.   Earnestine Mealing, CMA  Note - This record has been created using Editor, commissioning.  Chart creation errors have been sought, but may not always  have been located. Such creation errors do not reflect on  the standard of medical care.

## 2020-02-11 NOTE — Progress Notes (Signed)
I left a message on the answering machine for patient to call back.

## 2020-02-12 ENCOUNTER — Telehealth: Payer: Self-pay | Admitting: *Deleted

## 2020-02-12 DIAGNOSIS — R7989 Other specified abnormal findings of blood chemistry: Secondary | ICD-10-CM

## 2020-02-12 NOTE — Progress Notes (Signed)
Please advise patient to stay on Arava 10 mg p.o. daily.  He should repeat AST and ALT in 1 month.  If his LFTs are stable then we can increase the Arava to 20 mg in the future.

## 2020-02-12 NOTE — Telephone Encounter (Signed)
Future order placed 

## 2020-02-12 NOTE — Telephone Encounter (Signed)
-----   Message from Bo Merino, MD sent at 02/12/2020  2:40 PM EDT ----- Please advise patient to stay on Arava 10 mg p.o. daily.  He should repeat AST and ALT in 1 month.  If his LFTs are stable then we can increase the Arava to 20 mg in the future.

## 2020-02-25 ENCOUNTER — Ambulatory Visit: Payer: BC Managed Care – PPO | Admitting: Rheumatology

## 2020-02-27 ENCOUNTER — Encounter: Payer: Self-pay | Admitting: Rheumatology

## 2020-03-14 ENCOUNTER — Other Ambulatory Visit: Payer: Self-pay | Admitting: Family Medicine

## 2020-03-14 DIAGNOSIS — N529 Male erectile dysfunction, unspecified: Secondary | ICD-10-CM

## 2020-03-14 NOTE — Telephone Encounter (Signed)
Requested  medications are  due for refill today yes  Requested medications are on the active medication list yes  Last refill 6/15  Last visit 01/2019  Future visit scheduled 04/2020  Notes to clinic Last visit to address this med/dx more than 12 months ago, therefore failed protocol

## 2020-03-21 NOTE — Progress Notes (Signed)
Office Visit Note  Patient: Nathaniel Velazquez             Date of Birth: 06-04-1961           MRN: [id number]             PCP: [name], MD Referring: [name], MD Visit Date: 04/02/2020 Occupation: @GUAROCC @  Subjective:  Other (Doing well) and Medication Management   History of Present Illness: Nathaniel Velazquez is a 59 y.o. male with psoriatic arthritis, psoriasis and osteoarthritis.  He states he has been on Enbrel and Arava combination.  He was initially on Arava 20 mg p.o. daily.  We reduced the dose of Arava to 10 mg p.o. daily due to elevation of LFTs.  He states this is the best he has done in several months.  He still have some stiffness in his left knee and some stiffness in his hands in the morning.  He states he consumes alcohol on a regular basis currently.  He has been trying to watch his diet and has lost some weight.  Activities of Daily Living:  Patient reports morning stiffness for 2 hours.   Patient Denies nocturnal pain.  Difficulty dressing/grooming: Denies Difficulty climbing stairs: Reports Difficulty getting out of chair: Reports Difficulty using hands for taps, buttons, cutlery, and/or writing: Reports  Review of Systems  Constitutional: Negative for fatigue.  HENT: Negative for mouth sores, mouth dryness and nose dryness.   Eyes: Negative for pain, itching, visual disturbance and dryness.  Respiratory: Negative for shortness of breath and difficulty breathing.   Cardiovascular: Positive for swelling in legs/feet. Negative for chest pain and palpitations.  Gastrointestinal: Negative for abdominal pain, blood in stool, constipation and diarrhea.  Endocrine: Negative for increased urination.  Genitourinary: Negative for difficulty urinating and painful urination.  Musculoskeletal: Positive for arthralgias, joint pain, joint swelling and morning stiffness. Negative for myalgias, muscle weakness, muscle tenderness and myalgias.  Skin:  Positive for color change and redness. Negative for rash.  Allergic/Immunologic: Negative for susceptible to infections.  Neurological: Positive for numbness. Negative for dizziness, loss of consciousness and headaches.  Hematological: Negative for bruising/bleeding tendency.  Psychiatric/Behavioral: Negative for confusion and sleep disturbance.    PMFS History:  Patient Active Problem List   Diagnosis Date Noted  . High risk medication use 01/14/2020  . Pain in left knee 06/28/2019  . 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 BMI 38.0-38.9,adult 09/19/2018  . Family history of Wegener's granulomatosis 09/19/2018  . Moderate alcohol consumption 09/19/2018  . Psoriasis 09/19/2018  . Psoriatic arthritis ([place]) 09/19/2018  . Rectal pain 06/05/2015  . Hypertension   . Obesity   . Hyperlipidemia 11/21/2008  . PALPITATIONS 10/30/2008    Past Medical History:  Diagnosis Date  . Arthritis   . GERD (gastroesophageal reflux disease)   . Hypertension   . Obesity    Steroid injection in rt knee    Family History  Problem Relation Age of Onset  . Hypertrophic cardiomyopathy Brother   . Heart disease Mother   . Hypertension Mother   . Heart disease Father   . Hypertension Father   . Obesity Brother   . Heart disease Brother   . Healthy Daughter   . Ankylosing spondylitis Son   . Autoimmune disease Brother   . Healthy Daughter   . Colon cancer Neg Hx   . Colon polyps Neg Hx   . Esophageal cancer Neg Hx   . Pancreatic cancer Neg Hx   .  Stomach cancer Neg Hx   . Rectal cancer Neg Hx    Past Surgical History:  Procedure Laterality Date  . ANAL FISTULECTOMY  1987  . APPENDECTOMY    . KNEE SURGERY Bilateral    meniscal tear repairs  . ROTATOR CUFF REPAIR Bilateral 2018  . 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 Influenza, Quadrivalent, Recombinant, Inj, Pf 06/05/2018,  04/23/2019  . Influenza,inj,Quad PF,6+ Mos 05/17/2013, 04/10/2014, 04/23/2015, 04/06/2017  . Influenza,inj,quad, With Preservative 03/19/2017  . Influenza-Unspecified 05/19/2016, 05/18/2018, 04/23/2019  . PFIZER SARS-COV-2 Vaccination 09/26/2019, 10/17/2019  . Pneumococcal Polysaccharide-23 12/28/2018  . Td 07/19/1998  . Tdap 04/10/2014     Objective: Vital Signs: BP (!) 163/86 (BP Location: Left Arm, Patient Position: Sitting, Cuff Size: Small)   Pulse 83   Resp 14   Ht 6\' 3"  (1.905 m)   Wt 278 lb 6.4 oz (126.3 kg)   BMI 34.80 kg/m    Physical Exam Vitals and nursing note reviewed.  Constitutional:      Appearance: He is well-developed.  HENT:     Head: Normocephalic and atraumatic.  Eyes:     Conjunctiva/sclera: Conjunctivae normal.     Pupils: Pupils are equal, round, and reactive to light.  Cardiovascular:     Rate and Rhythm: Normal rate and regular rhythm.     Heart sounds: Normal heart sounds.  Pulmonary:     Effort: Pulmonary effort is normal.     Breath sounds: Normal breath sounds.  Abdominal:     General: Bowel sounds are normal.     Palpations: Abdomen is soft.  Musculoskeletal:     Cervical back: Normal range of motion and neck supple.  Skin:    General: Skin is warm and dry.     Capillary Refill: Capillary refill takes less than 2 seconds.  Neurological:     Mental Status: He is alert and oriented to person, place, and time.  Psychiatric:        Behavior: Behavior normal.      Musculoskeletal Exam: C-spine was in good range of motion.  Shoulder joints were in good range of motion.  He has right elbow joint contracture due to previous injury.  He has PIP and DIP thickening with no synovitis.  Hip joints were in good range of motion.  Right knee joint was in good range of motion without any warmth swelling or effusion.  He had moderate effusion in his left knee joint.  Ankle joints and MTPs with good range of motion.  CDAI Exam: CDAI Score: -- Patient  Global: --; Provider Global: -- Swollen: --; Tender: -- Joint Exam 04/02/2020   No joint exam has been documented for this visit   There is currently no information documented on the homunculus. Go to the Rheumatology activity and complete the homunculus joint exam.  Investigation: No additional findings.  Imaging: No results found.  Recent Labs: Lab Results  Component Value Date   WBC 8.2 02/08/2020   HGB 16.2 02/08/2020   PLT 203 02/08/2020   NA 132 (L) 02/08/2020   K 4.4 02/08/2020   CL 99 02/08/2020   CO2 22 02/08/2020   GLUCOSE 154 (H) 02/08/2020   BUN 14 02/08/2020   CREATININE 0.93 02/08/2020   BILITOT 0.8 02/08/2020   ALKPHOS 48 06/12/2018   AST 52 (H) 03/28/2020   ALT 95 (H) 03/28/2020   PROT 6.9 02/08/2020  ALBUMIN 4.6 06/12/2018   CALCIUM 9.6 02/08/2020   GFRAA 103 02/08/2020   QFTBGOLDPLUS NEGATIVE 09/27/2019    Speciality Comments: Inadequate response to Humira, Cosentyx, Otezla, Enbrel  Procedures:  No procedures performed Allergies: Gabapentin   Assessment / Plan:     Visit Diagnoses: Psoriatic arthritis ([place]) - Inadequate response to Otezla (monotherapy), Humira, and cosentyx.  He is currently on Enbrel weekly injections along with Arava 10 mg p.o. daily.  The dose of Arava was reduced from 20 to 10 mg due to elevation of LFTs.  We had detailed discussion regarding his treatment.  We discussed use of Rinvoq at the last visit.  He is doing better and would like to continue current treatment.  We also discussed that if his LFTs improve then we can increase the dose of leflunomide.  Weight loss diet and abstinence from alcohol was discussed at length.  Psoriasis-he has no active psoriasis.  High risk medication use - Arava 10 mg p.o. daily, Enbrel 50 mg sq injections once weekly. TB gold negative on 09/27/2019.  Recent labs showed elevated LFTs.  His LFTs have been fluctuating over the last few years.  Effusion, left knee-improved but is still  persist.  Primary osteoarthritis of both hands-he has some stiffness in his hands due to osteoarthritis.  Primary osteoarthritis of both feet-proper fitting shoes were discussed.  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  Elevated LFTs-I offered her referral to GI but she declined.  Risk of cirrhosis of liver with persistent elevation of LFTs was discussed.  He states his been diagnosed with fatty liver in the past.  Moderate alcohol consumption-abstinence from alcohol was discussed.  Other medical problems are listed as follows:  History of gastroesophageal reflux (GERD)  Essential hypertension  History of hyperlipidemia  Family history of ankylosing spondylitis  Family history of Wegener's granulomatosis  Educated about COVID-19 virus infection-he is fully vaccinated against COVID-19.  He has been advised to get a booster.  Precautions were discussed and placed in AVS.  Use of monoclonal antibodies in case of COVID-19 infection was also discussed.  Use of mask, social distancing and hand hygiene was discussed.  Orders: Orders Placed This Encounter  Procedures  . AST  . ALT   No orders of the defined types were placed in this encounter.  .  Follow-Up Instructions: Return in about 3 months (around 07/02/2020) for Psoriatic arthritis, Osteoarthritis.   Bo Merino, MD  Note - This record has been created using Editor, commissioning.  Chart creation errors have been sought, but may not always  have been located. Such creation errors do not reflect on  the standard of medical care.

## 2020-03-28 ENCOUNTER — Other Ambulatory Visit: Payer: Self-pay | Admitting: Rheumatology

## 2020-03-28 ENCOUNTER — Other Ambulatory Visit: Payer: Self-pay

## 2020-03-28 DIAGNOSIS — R7989 Other specified abnormal findings of blood chemistry: Secondary | ICD-10-CM

## 2020-03-29 LAB — ALT: ALT: 95 U/L — ABNORMAL HIGH (ref 9–46)

## 2020-03-29 LAB — AST: AST: 52 U/L — ABNORMAL HIGH (ref 10–35)

## 2020-03-30 NOTE — Progress Notes (Signed)
LFTs are higher than before.  I strongly advised to drink alcohol or take any anti-inflammatories.  I will discuss results at the follow-up visit.  I would also discuss possible referral to GI at the follow-up visit.

## 2020-03-31 ENCOUNTER — Encounter: Payer: Self-pay | Admitting: Rheumatology

## 2020-04-02 ENCOUNTER — Other Ambulatory Visit: Payer: Self-pay

## 2020-04-02 ENCOUNTER — Ambulatory Visit: Payer: BC Managed Care – PPO | Admitting: Rheumatology

## 2020-04-02 ENCOUNTER — Encounter: Payer: Self-pay | Admitting: Rheumatology

## 2020-04-02 VITALS — BP 163/86 | HR 83 | Resp 14 | Ht 75.0 in | Wt 278.4 lb

## 2020-04-02 DIAGNOSIS — L409 Psoriasis, unspecified: Secondary | ICD-10-CM

## 2020-04-02 DIAGNOSIS — M19042 Primary osteoarthritis, left hand: Secondary | ICD-10-CM

## 2020-04-02 DIAGNOSIS — L4050 Arthropathic psoriasis, unspecified: Secondary | ICD-10-CM

## 2020-04-02 DIAGNOSIS — M25462 Effusion, left knee: Secondary | ICD-10-CM

## 2020-04-02 DIAGNOSIS — Z79899 Other long term (current) drug therapy: Secondary | ICD-10-CM | POA: Diagnosis not present

## 2020-04-02 DIAGNOSIS — Z789 Other specified health status: Secondary | ICD-10-CM

## 2020-04-02 DIAGNOSIS — I10 Essential (primary) hypertension: Secondary | ICD-10-CM

## 2020-04-02 DIAGNOSIS — R7989 Other specified abnormal findings of blood chemistry: Secondary | ICD-10-CM

## 2020-04-02 DIAGNOSIS — M19072 Primary osteoarthritis, left ankle and foot: Secondary | ICD-10-CM

## 2020-04-02 DIAGNOSIS — M19071 Primary osteoarthritis, right ankle and foot: Secondary | ICD-10-CM

## 2020-04-02 DIAGNOSIS — Z7189 Other specified counseling: Secondary | ICD-10-CM

## 2020-04-02 DIAGNOSIS — Z8719 Personal history of other diseases of the digestive system: Secondary | ICD-10-CM

## 2020-04-02 DIAGNOSIS — M19041 Primary osteoarthritis, right hand: Secondary | ICD-10-CM

## 2020-04-02 DIAGNOSIS — Z832 Family history of diseases of the blood and blood-forming organs and certain disorders involving the immune mechanism: Secondary | ICD-10-CM

## 2020-04-02 DIAGNOSIS — D809 Immunodeficiency with predominantly antibody defects, unspecified: Secondary | ICD-10-CM

## 2020-04-02 DIAGNOSIS — Z8639 Personal history of other endocrine, nutritional and metabolic disease: Secondary | ICD-10-CM

## 2020-04-02 DIAGNOSIS — Z8269 Family history of other diseases of the musculoskeletal system and connective tissue: Secondary | ICD-10-CM

## 2020-04-02 NOTE — Patient Instructions (Addendum)
COVID-19 vaccine recommendations:   COVID-19 vaccine is recommended for everyone (unless you are allergic to a vaccine component), even if you are on a medication that suppresses your immune system.   If you are on Methotrexate, Cellcept (mycophenolate), Rinvoq, Morrie Sheldon, and Olumiant- hold the medication for 1 week after each vaccine. Hold Methotrexate for 2 weeks after the single dose COVID-19 vaccine.   If you are on Orencia subcutaneous injection - hold medication one week prior to and one week after the first COVID-19 vaccine dose (only).   If you are on Orencia IV infusions- time vaccination administration so that the first COVID-19 vaccination will occur four weeks after the infusion and postpone the subsequent infusion by one week.   If you are on Cyclophosphamide or Rituxan infusions please contact your doctor prior to receiving the COVID-19 vaccine.   Do not take Tylenol or any anti-inflammatory medications (NSAIDs) 24 hours prior to the COVID-19 vaccination.   There is no direct evidence about the efficacy of the COVID-19 vaccine in individuals who are on medications that suppress the immune system.   Even if you are fully vaccinated, and you are on any medications that suppress your immune system, please continue to wear a mask, maintain at least six feet social distance and practice hand hygiene.   If you develop a COVID-19 infection, please contact your PCP or our office to determine if you need antibody infusion.  The booster vaccine is now available for immunocompromised patients. It is advised that if you had Pfizer vaccine you should get Coca[employer] booster.  If you had a Moderna vaccine then you should get a Moderna booster. Johnson and Wynetta Emery does not have a booster vaccine at this time.  Please see the following web sites for updated information.    https://[email or web address]/[email or web address]/Files/COVID-19-Vaccination-Patient-Resources.[email or web address]  [email or web address]://[email or web address]/About-Us/Newsroom/Press-Releases/ID/1159   Standing Labs We placed an order today for your standing lab work.   Please have your standing labs drawn in October and every 3 months  If possible, please have your labs drawn 2 weeks prior to your appointment so that the provider can discuss your results at your appointment.  We have open lab daily Monday through Thursday from 8:30-12:30 PM and 1:30-4:30 PM and Friday from 8:30-12:30 PM and 1:30-4:00 PM at the office of Dr. Bo Merino, [facility].   Please be advised, patients with office appointments requiring lab work will take precedents over walk-in lab work.  If possible, please come for your lab work on Monday and Friday afternoons, as you may experience shorter wait times. The office is located at [address] [place], [place], [place] [address] No appointment is necessary.   Labs are drawn by [facility]. Please bring your co-pay at the time of your lab draw.  You may receive a bill from [facility] for your lab work.  If you wish to have your labs drawn at another location, please call the office 24 hours in advance to send orders.  If you have any questions regarding directions or hours of operation,  please call [phone].   As a reminder, please drink plenty of water prior to coming for your lab work. Thanks!

## 2020-04-24 ENCOUNTER — Ambulatory Visit: Payer: BC Managed Care – PPO | Admitting: Family Medicine

## 2020-06-05 ENCOUNTER — Other Ambulatory Visit: Payer: Self-pay | Admitting: Rheumatology

## 2020-06-05 NOTE — Telephone Encounter (Signed)
Last Visit: 04/02/2020 Next Visit: 07/02/2020 Labs: 02/08/2020 Elevated Liver functions, 03/28/2020 LFTs are higher than before. TB Gold: 09/27/2019 Neg   Current Dose per office note 04/02/2020: Enbrel 50 mg sq injections once weekly  DX: Psoriatic arthritis   Left message to advise patient he is due to update labs.   Okay to refill Enbrel?

## 2020-06-09 ENCOUNTER — Telehealth: Payer: Self-pay | Admitting: Rheumatology

## 2020-06-09 NOTE — Telephone Encounter (Signed)
FYI: Patient received message in regards to needing updated labs. Patient is out of town for the next couple of weeks. He will get back in touch with [place] when he returns.

## 2020-06-10 NOTE — Telephone Encounter (Signed)
Noted  

## 2020-06-13 ENCOUNTER — Other Ambulatory Visit: Payer: Self-pay | Admitting: Family Medicine

## 2020-06-13 DIAGNOSIS — I10 Essential (primary) hypertension: Secondary | ICD-10-CM

## 2020-06-13 NOTE — Telephone Encounter (Signed)
Requested medication (s) are due for refill today: yes   Requested medication (s) are on the active medication list: yes  Last refill:  02/23/2020  Future visit scheduled: no  Notes to clinic: overdue for follow up    Requested Prescriptions  Pending Prescriptions Disp Refills   lisinopril (ZESTRIL) 10 MG tablet [Pharmacy Med Name: LISINOPRIL TAB 10MG ] 90 tablet 1    Sig: TAKE 1 TABLET DAILY      Cardiovascular:  ACE Inhibitors Failed - 06/13/2020  2:29 AM      Failed - Last BP in normal range    BP Readings from Last 1 Encounters:  04/02/20 (!) 163/86          Failed - Valid encounter within last 6 months    Recent Outpatient Visits           7 months ago Essential hypertension   [facility], Ranell Patrick, MD   1 year ago Recurrent infections   [facility], Ranell Patrick, MD   1 year ago Immunization due   [facility], Ranell Patrick, MD   1 year ago Pain in finger of both hands   [facility], Ranell Patrick, MD   1 year ago Annual physical exam   [facility], Ranell Patrick, MD       Future Appointments             In 2 weeks Bo Merino, MD [place] - Cr in normal range and within 180 days    Creat  Date Value Ref Range Status  02/08/2020 0.93 0.70 - 1.25 mg/dL Final    Comment:    For patients >16 years of age, the reference limit for Creatinine is approximately 13% higher for people identified as African-American. .           Passed - K in normal range and within 180 days    Potassium  Date Value Ref Range Status  02/08/2020 4.4 3.5 - 5.3 mmol/L Final          Passed - Patient is not pregnant        metoprolol succinate (TOPROL-XL) 25 MG 24 hr tablet [Pharmacy Med Name: METOPROL SUC TAB 25MG  ER] 90 tablet 1    Sig: TAKE 1 TABLET DAILY      Cardiovascular:  Beta Blockers Failed - 06/13/2020  2:29 AM      Failed - Last BP in normal  range    BP Readings from Last 1 Encounters:  04/02/20 (!) 163/86          Failed - Valid encounter within last 6 months    Recent Outpatient Visits           7 months ago Essential hypertension   [facility], Ranell Patrick, MD   1 year ago Recurrent infections   [facility], Ranell Patrick, MD   1 year ago Immunization due   [facility], Ranell Patrick, MD   1 year ago Pain in finger of both hands   [facility], Ranell Patrick, MD   1 year ago Annual physical exam   [facility], Ranell Patrick, MD       Future Appointments             In  2 weeks Bo Merino, MD [facility]            Passed - Last Heart Rate in normal range    Pulse Readings from Last 1 Encounters:  04/02/20 83

## 2020-06-18 ENCOUNTER — Encounter: Payer: Self-pay | Admitting: Family Medicine

## 2020-06-18 ENCOUNTER — Other Ambulatory Visit: Payer: Self-pay

## 2020-06-18 ENCOUNTER — Ambulatory Visit: Payer: BC Managed Care – PPO | Admitting: Family Medicine

## 2020-06-18 VITALS — BP 154/94 | HR 85 | Temp 98.0°F | Ht 75.0 in | Wt 276.8 lb

## 2020-06-18 DIAGNOSIS — H6691 Otitis media, unspecified, right ear: Secondary | ICD-10-CM

## 2020-06-18 DIAGNOSIS — I10 Essential (primary) hypertension: Secondary | ICD-10-CM | POA: Diagnosis not present

## 2020-06-18 DIAGNOSIS — R7303 Prediabetes: Secondary | ICD-10-CM

## 2020-06-18 DIAGNOSIS — S91202A Unspecified open wound of left great toe with damage to nail, initial encounter: Secondary | ICD-10-CM

## 2020-06-18 MED ORDER — AMOXICILLIN-POT CLAVULANATE 875-125 MG PO TABS: 1.0000 | ORAL_TABLET | Freq: Two times a day (BID) | ORAL | 0 refills | Status: DC

## 2020-06-18 MED ORDER — METOPROLOL SUCCINATE ER 25 MG PO TB24: 25.0000 mg | ORAL_TABLET | Freq: Every day | ORAL | 2 refills | Status: DC

## 2020-06-18 MED ORDER — LISINOPRIL 10 MG PO TABS: 10.0000 mg | ORAL_TABLET | Freq: Every day | ORAL | 2 refills | Status: DC

## 2020-06-18 NOTE — Patient Instructions (Addendum)
  Start Augmentin for ear infection.  Return to the clinic or go to the nearest emergency room if any of your symptoms worsen or new symptoms occur.  Blood pressure elevated here as well as your previous visit in September at rheumatology.  Monitor your blood pressures at home and if those do not improve to under 130/90 as ear pain improves, I would recommend taking 2 of the lisinopril pills per day for a total dose of 20 mg.  Let me know if you make that change and I will adjust your prescription.  Continue same dose of metoprolol for now.  I suspect the left great toenail will eventually come off as the new nail appears to be forming.  If that does not improve, let me know and I will be happy to refer you to podiatry.  If you have lab work done today you will be contacted with your lab results within the next 2 weeks.  If you have not heard from [place] then please contact us. The fastest way to get your results is to register for My Chart.   IF you received an x-ray today, you will receive an invoice from [facility]. Please contact [facility] at [phone] with questions or concerns regarding your invoice.   IF you received labwork today, you will receive an invoice from [place]. Please contact [facility] at [phone] with questions or concerns regarding your invoice.   Our billing staff will not be able to assist you with questions regarding bills from these companies.  You will be contacted with the lab results as soon as they are available. The fastest way to get your results is to activate your My Chart account. Instructions are located on the last page of this paperwork. If you have not heard from [place] regarding the results in 2 weeks, please contact this office.

## 2020-06-18 NOTE — Progress Notes (Signed)
Subjective:  Patient ID: Nathaniel Velazquez, male    DOB: 02-17-1961  Age: 59 y.o. MRN: [id number]  CC:  Chief Complaint  Patient presents with  . left big toe nail black    going on since july   . right middle ear pain    going on 3-4 weeks now   . Medication Refill    metoprolol and lisinopril     HPI Nathaniel Velazquez presents for   Concerns above  Hypertension: Lisinopril 10 mg daily, Toprol-XL 25 mg daily. No new side effects, no missed doses.  Home readings: 120/80's at home. Initial elevation in 140's, then comes down. Last tested few weeks ago.  Pain in ear currently.   BP Readings from Last 3 Encounters:  06/18/20 (!) 154/94  04/02/20 (!) 163/86  01/14/20 132/75   Lab Results  Component Value Date   CREATININE 0.93 02/08/2020   Discolored left great toenail: Fall July 4th on ceramic tile. R 2nd toenail turned dark and fell off, left great toenail still dark. Sensitive, but no pain walking.  Tx: none except trimming.   Right ear pain: Chest congestion, cough, fever, HA, back pain few weeks ago - negative covid test x2, including 5 days later. Had some decreased smell and taste - better now after fluids and rest.  Back to normal activity, some persistent head congestion, R ear pressure/pain. No recent fever. No ear d/c. Initially took sudafed - stopped 2 weeks ago (hx HTN).  Has received covid vaccine pfizer and booster.  Hx of IGA deficiency, treated by allergist  amox in past ineffective with prior ear infections, augmentin has been effective.   Prediabetes: Did have some weight loss when discussed in April.  No recent testing. Lab Results  Component Value Date   HGBA1C 5.7 (H) 06/12/2018   Wt Readings from Last 3 Encounters:  06/18/20 276 lb 12.8 oz (125.6 kg)  04/02/20 278 lb 6.4 oz (126.3 kg)  01/14/20 281 lb (127.5 kg)   Not fasting.  Plans to meet with Dr. Amil Amen for rheum second opinion. Currently in remission.   History Patient Active  Problem List   Diagnosis Date Noted  . High risk medication use 01/14/2020  . Pain in left knee 06/28/2019  . 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 BMI 38.0-38.9,adult 09/19/2018  . Family history of Wegener's granulomatosis 09/19/2018  . Moderate alcohol consumption 09/19/2018  . Psoriasis 09/19/2018  . Psoriatic arthritis ([place]) 09/19/2018  . Rectal pain 06/05/2015  . Hypertension   . Obesity   . Hyperlipidemia 11/21/2008  . PALPITATIONS 10/30/2008   Past Medical History:  Diagnosis Date  . Arthritis   . GERD (gastroesophageal reflux disease)   . Hypertension   . Obesity    Steroid injection in rt knee   Past Surgical History:  Procedure Laterality Date  . ANAL FISTULECTOMY  1987  . APPENDECTOMY    . KNEE SURGERY Bilateral    meniscal tear repairs  . ROTATOR CUFF REPAIR Bilateral 2018  . TONSILLECTOMY     Allergies  Allergen Reactions  . Gabapentin Other (See Comments)    Made very jittery   Prior to Admission medications   Medication Sig Start Date End Date Taking? Authorizing Provider  Cholecalciferol 25 MCG (1000 UT) capsule Take 1,000 Units by mouth daily.   Yes [provider]  clobetasol ointment (TEMOVATE) 0.05 % as needed. As directed 05/15/12  Yes [provider]  ENBREL MINI 50 MG/ML SOCT INSERT MINI CARTRIDGE INTO  AUTOINJECTOR AND INJECT UNDER THE SKIN EVERY 7 DAYS. 06/05/20  Yes Ofilia Neas, PA-C  leflunomide (ARAVA) 10 MG tablet Take 1 tablet daily for 2 weeks then increase to 2 tablet daily. 01/14/20  Yes Yopp, Amber C, RPH-CPP  lisinopril (ZESTRIL) 10 MG tablet Take 1 tablet (10 mg total) by mouth daily. Appointment required for future refills. 06/16/20  Yes [name], MD  metoprolol succinate (TOPROL-XL) 25 MG 24 hr tablet Take 1 tablet (25 mg total) by mouth daily. Appointment required for future refills. 06/16/20  Yes [name], MD  Milk Thistle 250 MG CAPS Take by mouth.   Yes [provider]   nystatin-triamcinolone ointment (MYCOLOG) APPLY SPARINGLY TO THE AFFECTED AREA FOR UP TO 7 DAYS Patient taking differently: as needed. APPLY SPARINGLY TO THE AFFECTED AREA FOR UP TO 7 DAYS 06/23/18  Yes [name], MD  psyllium (METAMUCIL) 58.6 % powder Take 1 packet by mouth 2 (two) times daily.    Yes [provider]  sildenafil (REVATIO) 20 MG tablet TAKE ONE TO THREE TABLETS BY MOUTH DAILY AS NEEDED 03/17/20  Yes [name], MD  vitamin E (VITAMIN E) 180 MG (400 UNITS) capsule Take 400 Units by mouth daily.   Yes [provider]   Social History   Socioeconomic History  . Marital status: Married    Spouse name: Not on file  . Number of children: Not on file  . Years of education: Not on file  . Highest education level: Not on file  Occupational History  . Not on file  Tobacco Use  . Smoking status: Never Smoker  . Smokeless tobacco: Never Used  Vaping Use  . Vaping Use: Never used  Substance and Sexual Activity  . Alcohol use: Yes    Comment: occ  . Drug use: No  . Sexual activity: Never  Other Topics Concern  . Not on file  Social History Narrative   Sales rep   Social Determinants of Health   Financial Resource Strain:   . Difficulty of Paying Living Expenses: Not on file  Food Insecurity:   . Worried About Charity fundraiser in the Last Year: Not on file  . Ran Out of Food in the Last Year: Not on file  Transportation Needs:   . Lack of Transportation (Medical): Not on file  . Lack of Transportation (Non-Medical): Not on file  Physical Activity:   . Days of Exercise per Week: Not on file  . Minutes of Exercise per Session: Not on file  Stress:   . Feeling of Stress : Not on file  Social Connections:   . Frequency of Communication with Friends and Family: Not on file  . Frequency of Social Gatherings with Friends and Family: Not on file  . Attends Religious Services: Not on file  . Active Member of Clubs or Organizations: Not on  file  . Attends Archivist Meetings: Not on file  . Marital Status: Not on file  Intimate Partner Violence:   . Fear of Current or Ex-Partner: Not on file  . Emotionally Abused: Not on file  . Physically Abused: Not on file  . Sexually Abused: Not on file    Review of Systems  Constitutional: Negative for fatigue and unexpected weight change.  Eyes: Negative for visual disturbance.  Respiratory: Negative for cough, chest tightness and shortness of breath (only when sick few weeks ago. ).   Cardiovascular: Negative for chest pain, palpitations  and leg swelling.  Gastrointestinal: Negative for abdominal pain and blood in stool.  Neurological: Negative for dizziness, light-headedness and headaches.     Objective:   Vitals:   06/18/20 1036 06/18/20 1038  BP: (!) 154/103 (!) 154/94  Pulse: 85   Temp: 98 F (36.7 C)   TempSrc: Temporal   SpO2: 96%   Weight: 276 lb 12.8 oz (125.6 kg)   Height: 6\' 3"  (1.905 m)      Physical Exam Vitals reviewed.  Constitutional:      Appearance: He is well-developed.  HENT:     Head: Normocephalic and atraumatic.     Right Ear: There is no impacted cerumen.     Left Ear: Tympanic membrane and ear canal normal. There is no impacted cerumen (min cerumen, not obstructed. ).     Ears:     Comments: R pinna, mastoid nontender.  Min erythema of canal, scarring of tm inferiorly with effusion, injected, appears to be bulging. No rupture or fluid in canal.  Eyes:     Pupils: Pupils are equal, round, and reactive to light.  Neck:     Vascular: No carotid bruit or JVD.  Cardiovascular:     Rate and Rhythm: Normal rate and regular rhythm.     Heart sounds: Normal heart sounds. No murmur heard.   Pulmonary:     Effort: Pulmonary effort is normal.     Breath sounds: Normal breath sounds. No rales.  Skin:    General: Skin is warm and dry.  Neurological:     Mental Status: He is alert and oriented to person, place, and time.   Left  great toenail, distal 3/4 of nail darkened/discolored, appears to be slightly floating above nail bed.  Nontender.  No surrounding erythema or edema of nail folds.  New nail appears to be growing underneath proximally.  No bony tenderness or toe tenderness on palpation.  32 minutes spent during visit, greater than 50% counseling and assimilation of information, chart review, and discussion of plan.   Assessment & Plan:  Nathaniel Velazquez is a 58 y.o. male . Essential hypertension - Plan: Basic metabolic panel, lisinopril (ZESTRIL) 10 MG tablet, metoprolol succinate (TOPROL-XL) 25 MG 24 hr tablet  -Elevated in office, potentially related to your pain.  Home monitoring recommended, and if persistent elevation will increase lisinopril to 20 mg daily, continue metoprolol same dose for now.  Advise me if that change is made with orthostatic precautions.  Prediabetes - Plan: Hemoglobin A1c  -Anticipate improvement with weight loss.  Right otitis media, unspecified otitis media type - Plan: amoxicillin-clavulanate (AUGMENTIN) 875-125 MG tablet  -Recurrent otitis previously with immunoglobulin deficiency.  Appears to have acute otitis media on the right, will treat with Augmentin with potential side effects discussed.  RTC precautions  Traumatic loss of toenail of left great toe, initial encounter  -Likely subungual hematoma previously, with traumatic onycholysis.  New nail forming.  Option of podiatry eval for removal of lysed nail, but not loose at this time.  RTC precautions.  Meds ordered this encounter  Medications  . amoxicillin-clavulanate (AUGMENTIN) 875-125 MG tablet    Sig: Take 1 tablet by mouth 2 (two) times daily.    Dispense:  20 tablet    Refill:  0  . lisinopril (ZESTRIL) 10 MG tablet    Sig: Take 1 tablet (10 mg total) by mouth daily.    Dispense:  90 tablet    Refill:  2  . metoprolol succinate (TOPROL-XL) 25  MG 24 hr tablet    Sig: Take 1 tablet (25 mg total) by mouth daily.     Dispense:  90 tablet    Refill:  2   Patient Instructions    Start Augmentin for ear infection.  Return to the clinic or go to the nearest emergency room if any of your symptoms worsen or new symptoms occur.  Blood pressure elevated here as well as your previous visit in September at rheumatology.  Monitor your blood pressures at home and if those do not improve to under 130/90 as ear pain improves, I would recommend taking 2 of the lisinopril pills per day for a total dose of 20 mg.  Let me know if you make that change and I will adjust your prescription.  Continue same dose of metoprolol for now.  I suspect the left great toenail will eventually come off as the new nail appears to be forming.  If that does not improve, let me know and I will be happy to refer you to podiatry.  If you have lab work done today you will be contacted with your lab results within the next 2 weeks.  If you have not heard from [place] then please contact us. The fastest way to get your results is to register for My Chart.   IF you received an x-ray today, you will receive an invoice from [facility]. Please contact [facility]iology at [phone] with questions or concerns regarding your invoice.   IF you received labwork today, you will receive an invoice from [place]. Please contact [facility] at [phone] with questions or concerns regarding your invoice.   Our billing staff will not be able to assist you with questions regarding bills from these companies.  You will be contacted with the lab results as soon as they are available. The fastest way to get your results is to activate your My Chart account. Instructions are located on the last page of this paperwork. If you have not heard from [place] regarding the results in 2 weeks, please contact this office.          Signed, [name], MD [facility]

## 2020-06-19 LAB — HEMOGLOBIN A1C
Est. average glucose Bld gHb Est-mCnc: 123 mg/dL
Hgb A1c MFr Bld: 5.9 % — ABNORMAL HIGH (ref 4.8–5.6)

## 2020-06-19 LAB — BASIC METABOLIC PANEL
BUN/Creatinine Ratio: 11 (ref 10–24)
BUN: 9 mg/dL (ref 8–27)
CO2: 19 mmol/L — ABNORMAL LOW (ref 20–29)
Calcium: 10 mg/dL (ref 8.6–10.2)
Chloride: 102 mmol/L (ref 96–106)
Creatinine, Ser: 0.8 mg/dL (ref 0.76–1.27)
GFR calc Af Amer: 112 mL/min/{1.73_m2} (ref >59)
GFR calc non Af Amer: 97 mL/min/{1.73_m2} (ref >59)
Glucose: 116 mg/dL — ABNORMAL HIGH (ref 65–99)
Potassium: 4.3 mmol/L (ref 3.5–5.2)
Sodium: 137 mmol/L (ref 134–144)

## 2020-06-19 NOTE — Progress Notes (Deleted)
Office Visit Note  Patient: Nathaniel Velazquez             Date of Birth: [birth year]           MRN: [id number]             PCP: [name], MD Referring: [name], MD Visit Date: 07/02/2020 Occupation: @GUAROCC @  Subjective:  No chief complaint on file.   History of Present Illness: Nathaniel Velazquez is a 59 y.o. male ***   Activities of Daily Living:  Patient reports morning stiffness for *** {minute/hour:19697}.   Patient {ACTIONS;DENIES/REPORTS:21021675::"Denies"} nocturnal pain.  Difficulty dressing/grooming: {ACTIONS;DENIES/REPORTS:21021675::"Denies"} Difficulty climbing stairs: {ACTIONS;DENIES/REPORTS:21021675::"Denies"} Difficulty getting out of chair: {ACTIONS;DENIES/REPORTS:21021675::"Denies"} Difficulty using hands for taps, buttons, cutlery, and/or writing: {ACTIONS;DENIES/REPORTS:21021675::"Denies"}  No Rheumatology ROS completed.   PMFS History:  Patient Active Problem List   Diagnosis Date Noted  . High risk medication use 01/14/2020  . Pain in left knee 06/28/2019  . 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 BMI 38.0-38.9,adult 09/19/2018  . Family history of Wegener's granulomatosis 09/19/2018  . Moderate alcohol consumption 09/19/2018  . Psoriasis 09/19/2018  . Psoriatic arthritis ([place]) 09/19/2018  . Rectal pain 06/05/2015  . Hypertension   . Obesity   . Hyperlipidemia 11/21/2008  . PALPITATIONS 10/30/2008    Past Medical History:  Diagnosis Date  . Arthritis   . GERD (gastroesophageal reflux disease)   . Hypertension   . Obesity    Steroid injection in rt knee    Family History  Problem Relation Age of Onset  . Hypertrophic cardiomyopathy Brother   . Heart disease Mother   . Hypertension Mother   . Heart disease Father   . Hypertension Father   . Obesity Brother   . Heart disease Brother   . Healthy Daughter   . Ankylosing spondylitis Son   . Autoimmune disease Brother   . Healthy Daughter   . Colon cancer Neg Hx    . Colon polyps Neg Hx   . Esophageal cancer Neg Hx   . Pancreatic cancer Neg Hx   . Stomach cancer Neg Hx   . Rectal cancer Neg Hx    Past Surgical History:  Procedure Laterality Date  . ANAL FISTULECTOMY  1987  . APPENDECTOMY    . KNEE SURGERY Bilateral    meniscal tear repairs  . ROTATOR CUFF REPAIR Bilateral 2018  . 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 Influenza, Quadrivalent, Recombinant, Inj, Pf 06/05/2018, 04/23/2019  . Influenza,inj,Quad PF,6+ Mos 05/17/2013, 04/10/2014, 04/23/2015, 04/06/2017  . Influenza,inj,quad, With Preservative 03/19/2017  . Influenza-Unspecified 05/19/2016, 05/18/2018, 04/23/2019  . PFIZER SARS-COV-2 Vaccination 09/26/2019, 10/17/2019  . Pneumococcal Polysaccharide-23 12/28/2018  . Td 07/19/1998  . Tdap 04/10/2014     Objective: Vital Signs: There were no vitals taken for this visit.   Physical Exam   Musculoskeletal Exam: ***  CDAI Exam: CDAI Score: -- Patient Global: --; Provider Global: -- Swollen: --; Tender: -- Joint Exam 07/02/2020   No joint exam has been documented for this visit   There is currently no information documented on the homunculus. Go to the Rheumatology activity and complete the homunculus joint exam.  Investigation: No additional findings.  Imaging: No results found.  Recent Labs: Lab Results  Component Value Date   WBC 8.2 02/08/2020   HGB 16.2 02/08/2020   PLT 203 02/08/2020   NA 137 06/18/2020  K 4.3 06/18/2020   CL 102 06/18/2020   CO2 19 (L) 06/18/2020   GLUCOSE 116 (H) 06/18/2020   BUN 9 06/18/2020   CREATININE 0.80 06/18/2020   BILITOT 0.8 02/08/2020   ALKPHOS 48 06/12/2018   AST 52 (H) 03/28/2020   ALT 95 (H) 03/28/2020   PROT 6.9 02/08/2020   ALBUMIN 4.6 06/12/2018   CALCIUM 10.0 06/18/2020   GFRAA 112 06/18/2020   QFTBGOLDPLUS NEGATIVE 09/27/2019    Speciality Comments:  Inadequate response to Humira, Cosentyx, Otezla, Enbrel  Procedures:  No procedures performed Allergies: Gabapentin   Assessment / Plan:     Visit Diagnoses: No diagnosis found.  Orders: No orders of the defined types were placed in this encounter.  No orders of the defined types were placed in this encounter.   Face-to-face time spent with patient was *** minutes. Greater than 50% of time was spent in counseling and coordination of care.  Follow-Up Instructions: No follow-ups on file.   Earnestine Mealing, CMA  Note - This record has been created using Editor, commissioning.  Chart creation errors have been sought, but may not always  have been located. Such creation errors do not reflect on  the standard of medical care.

## 2020-06-26 ENCOUNTER — Encounter: Payer: Self-pay | Admitting: Rheumatology

## 2020-06-26 ENCOUNTER — Encounter: Payer: Self-pay | Admitting: Family Medicine

## 2020-06-26 DIAGNOSIS — H6691 Otitis media, unspecified, right ear: Secondary | ICD-10-CM

## 2020-06-27 ENCOUNTER — Telehealth: Payer: Self-pay | Admitting: Family Medicine

## 2020-06-27 NOTE — Telephone Encounter (Deleted)
Patient is finished and no better what's next? Does he need to get more / very concerned because he had this 30 years ago and had to get tubes put ib ear    Please advse

## 2020-06-27 NOTE — Telephone Encounter (Signed)
MyChart message from 06/26/2020 was forwarded to Dr. Carlota Raspberry, and is awaiting his response.

## 2020-06-27 NOTE — Telephone Encounter (Signed)
Called patient.  Still feels same as last visit. Still feel like R ear is blocked. Taking antibiotic - last dose tonight. No pain, no fever just fullness in ear. Still same difficulty hearing - not worse, muffled, but still able to hear.   Possible persistent effusion. Deferred further antibiotics at this time, but urgent care eval for repeat exam option this weekend. Will refer to ENT as well, with RTC/ER precautions.

## 2020-06-27 NOTE — Telephone Encounter (Signed)
Please refer to 06/26/2020 mychart message  Patient is finished and no better what's next? Does he need to get more / very concerned because he had this 30 years ago and had to get tubes put ib ear    Please advse

## 2020-07-02 ENCOUNTER — Ambulatory Visit: Payer: BC Managed Care – PPO | Admitting: Rheumatology

## 2020-07-02 DIAGNOSIS — L4050 Arthropathic psoriasis, unspecified: Secondary | ICD-10-CM

## 2020-07-02 DIAGNOSIS — L409 Psoriasis, unspecified: Secondary | ICD-10-CM

## 2020-07-02 DIAGNOSIS — Z789 Other specified health status: Secondary | ICD-10-CM

## 2020-07-02 DIAGNOSIS — M25462 Effusion, left knee: Secondary | ICD-10-CM

## 2020-07-02 DIAGNOSIS — M19041 Primary osteoarthritis, right hand: Secondary | ICD-10-CM

## 2020-07-02 DIAGNOSIS — D809 Immunodeficiency with predominantly antibody defects, unspecified: Secondary | ICD-10-CM

## 2020-07-02 DIAGNOSIS — Z8719 Personal history of other diseases of the digestive system: Secondary | ICD-10-CM

## 2020-07-02 DIAGNOSIS — Z8269 Family history of other diseases of the musculoskeletal system and connective tissue: Secondary | ICD-10-CM

## 2020-07-02 DIAGNOSIS — R7989 Other specified abnormal findings of blood chemistry: Secondary | ICD-10-CM

## 2020-07-02 DIAGNOSIS — Z8639 Personal history of other endocrine, nutritional and metabolic disease: Secondary | ICD-10-CM

## 2020-07-02 DIAGNOSIS — M19071 Primary osteoarthritis, right ankle and foot: Secondary | ICD-10-CM

## 2020-07-02 DIAGNOSIS — Z79899 Other long term (current) drug therapy: Secondary | ICD-10-CM

## 2020-07-02 DIAGNOSIS — I10 Essential (primary) hypertension: Secondary | ICD-10-CM

## 2020-07-03 ENCOUNTER — Telehealth: Payer: Self-pay | Admitting: Family Medicine

## 2020-07-03 NOTE — Telephone Encounter (Signed)
Copy of labs from rheumatology noted, dated December 8th.  AST 61, ALT 107.  These have increased from AST 52, ALT 95 on September 10th in [facility].  Please send most recent AST/ALT to Dr. Melissa Noon office, but patient will need to follow-up with myself or Dr. Amil Amen regarding the increased liver tests if he has not already discussed elevated liver test and plan with Dr. Amil Amen.  Previous rheumatologist did recommend possible gastroenterology eval if persistent elevated liver tests.  Let me know if patient was advised of these results and plan, and can assist further if needed.

## 2020-07-04 NOTE — Telephone Encounter (Signed)
Pt has called back and he stated that he has received the message and his response to the message that he knows that his Labs are stable and will return to his Rheumatologist in 3 months.  I stated understanding.

## 2020-07-04 NOTE — Telephone Encounter (Signed)
Detail message below was left on machine. If he had any question to return call. If patient call back please give patient below msg from Dr Carlota Raspberry

## 2020-10-05 ENCOUNTER — Encounter: Payer: Self-pay | Admitting: Family Medicine

## 2020-10-05 DIAGNOSIS — I10 Essential (primary) hypertension: Secondary | ICD-10-CM

## 2020-10-07 MED ORDER — LISINOPRIL 20 MG PO TABS: 20.0000 mg | ORAL_TABLET | Freq: Every day | ORAL | 1 refills | Status: DC

## 2020-10-27 ENCOUNTER — Telehealth: Payer: Self-pay | Admitting: Family Medicine

## 2020-10-27 NOTE — Telephone Encounter (Signed)
Patient was diagnosed with Covid on Saturday.  He has an auto-immune disease and takes embrel.  He would like to be referred for  treatment.

## 2020-10-27 NOTE — Telephone Encounter (Signed)
Pt has autoimmune disease and takes Embrel was Dx with COVID 10/25/2020 requesting referral to treatment please advise

## 2020-10-27 NOTE — Telephone Encounter (Signed)
Patient called back and said that time is of the essence - please call him back.

## 2020-10-27 NOTE — Telephone Encounter (Signed)
Pt called back about getting referral to have COVID treatment

## 2020-10-28 ENCOUNTER — Other Ambulatory Visit: Payer: Self-pay | Admitting: Registered Nurse

## 2020-10-28 DIAGNOSIS — U071 COVID-19: Secondary | ICD-10-CM

## 2020-10-28 NOTE — Telephone Encounter (Signed)
Referral placed  Thanks,  Rich

## 2020-10-28 NOTE — Telephone Encounter (Signed)
Pt called following up on this. Pt states he was asking for an oral treatment. Please advise.

## 2020-10-28 NOTE — Telephone Encounter (Signed)
Patient referral was sent today.

## 2020-10-29 ENCOUNTER — Telehealth: Payer: BC Managed Care – PPO | Admitting: Family

## 2020-10-29 ENCOUNTER — Telehealth: Payer: Self-pay

## 2020-10-29 ENCOUNTER — Encounter: Payer: Self-pay | Admitting: Family

## 2020-10-29 DIAGNOSIS — Z79899 Other long term (current) drug therapy: Secondary | ICD-10-CM

## 2020-10-29 DIAGNOSIS — U071 COVID-19: Secondary | ICD-10-CM

## 2020-10-29 DIAGNOSIS — D809 Immunodeficiency with predominantly antibody defects, unspecified: Secondary | ICD-10-CM

## 2020-10-29 DIAGNOSIS — L4050 Arthropathic psoriasis, unspecified: Secondary | ICD-10-CM

## 2020-10-29 DIAGNOSIS — I159 Secondary hypertension, unspecified: Secondary | ICD-10-CM

## 2020-10-29 NOTE — Progress Notes (Signed)
   Virtual Visit  Note Due to COVID-19 pandemic this visit was conducted virtually. This visit type was conducted due to national recommendations for restrictions regarding the COVID-19 Pandemic (e.g. social distancing, sheltering in place) in an effort to limit this patient's exposure and mitigate transmission in our community. All issues noted in this document were discussed and addressed.  A physical exam was not performed with this format.  I connected with [name] on 10/29/20 at 4:56 pm  by telephone and verified that I am speaking with the correct person using two identifiers. THERON CUMBIE is currently located at home and wife is currently with him  during visit. The provider, Evelina Dun, FNP is located in their office at time of visit.  I discussed the limitations, risks, security and privacy concerns of performing an evaluation and management service by telephone and the availability of in person appointments. I also discussed with the patient that there may be a patient responsible charge related to this service. The patient expressed understanding and agreed to proceed.   History and Present Illness:  HPI Pt calls today with complaints of COVID. He states he had a fever, cough, chest tightness, diarrhea,  mild SOB on 10/24/20 and took a COVID test on 10/25/20 that was positive. He reports a fever for 2 days that have resolved since 10/26/20.    He has RA and takes Enbrel. His rheumatologists sent in a prescription of prednisone and stopped the Enbrel for 10 days.     He reports he has been vaccinated X3 and his last booster was 04/16/20.   He states he was called by the infusion center from [facility], but states he has been playing phone tag.  Review of Systems  Constitutional: Positive for fever and malaise/fatigue.  Respiratory: Positive for shortness of breath and wheezing.   All other systems reviewed and are negative.    Observations/Objective: No SOB or  distress noted, obese man  Assessment and Plan: 1. COVID-19 virus detected - Ambulatory referral for Covid Treatment  2. Secondary hypertension - Ambulatory referral for Covid Treatment  3. Psoriatic arthritis ([place]) - Ambulatory referral for Covid Treatment  4. 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      I discussed the assessment and treatment plan with the patient. The patient was provided an opportunity to ask questions and all were answered. The patient agreed with the plan and demonstrated an understanding of the instructions.   The patient was advised to call back or seek an in-person evaluation if the symptoms worsen or if the condition fails to improve as anticipated.  The above assessment and management plan was discussed with the patient. The patient verbalized understanding of and has agreed to the management plan. Patient is aware to call the clinic if symptoms persist or worsen. Patient is aware when to return to the clinic for a follow-up visit. Patient educated on when it is appropriate to go to the emergency department.   Time call ended:  5:05 pm   I provided 9 minutes of   face-to-face time during this encounter.    Evelina Dun, FNP

## 2020-10-29 NOTE — Telephone Encounter (Signed)
Called to discuss with patient about COVID-19 symptoms and the use of one of the available treatments for those with mild to moderate Covid symptoms and at a high risk of hospitalization.  Pt appears to qualify for outpatient treatment due to co-morbid conditions and/or a member of an at-risk group in accordance with the FDA Emergency Use Authorization.    Symptom onset: Unknown Vaccinated: Yes Booster? Unknown Immunocompromised? Yes Qualifiers: Psoriatic arthritis  Unable to reach pt - Left message and call back number [phone].  Nathaniel Velazquez

## 2020-10-30 ENCOUNTER — Telehealth: Payer: Self-pay | Admitting: Physician Assistant

## 2020-10-30 NOTE — Telephone Encounter (Signed)
Called to discuss with patient about COVID-19 symptoms and the use of one of the available treatments for those with mild to moderate Covid symptoms and at a high risk of hospitalization.  Pt appears to qualify for outpatient treatment due to co-morbid conditions and/or a member of an at-risk group in accordance with the FDA Emergency Use Authorization.    Symptom onset: 4/8  Vaccinated: yes Booster? yes Immunocompromised? yes Qualifiers: BMI, RA on enbrel, CVD.  Pt is out of the window for orals (5 days) and we cannot offer the mab infusion at [facility] until 4/18. Will see if any outside institutions can accommodate him   Nathaniel Velazquez

## 2020-12-02 DIAGNOSIS — L98499 Non-pressure chronic ulcer of skin of other sites with unspecified severity: Secondary | ICD-10-CM | POA: Insufficient documentation

## 2020-12-17 ENCOUNTER — Telehealth: Payer: Self-pay | Admitting: Family Medicine

## 2020-12-17 ENCOUNTER — Other Ambulatory Visit: Payer: Self-pay

## 2020-12-17 DIAGNOSIS — I10 Essential (primary) hypertension: Secondary | ICD-10-CM

## 2020-12-17 MED ORDER — LISINOPRIL 20 MG PO TABS: 20.0000 mg | ORAL_TABLET | Freq: Every day | ORAL | 0 refills | Status: DC

## 2020-12-17 NOTE — Telephone Encounter (Signed)
Prescription sent and patient notified.

## 2020-12-17 NOTE — Telephone Encounter (Signed)
Pharmacy called in asking for a short 14 day supply of the lisinopril 20mg . She states that his order is on delay and he only has 1 more pill left.  Please call pt once this has been done and pt would like to use [facility] on [address]

## 2020-12-31 ENCOUNTER — Encounter: Payer: Self-pay | Admitting: Gastroenterology

## 2021-03-02 ENCOUNTER — Other Ambulatory Visit: Payer: Self-pay | Admitting: Family Medicine

## 2021-03-02 ENCOUNTER — Other Ambulatory Visit: Payer: Self-pay

## 2021-03-02 DIAGNOSIS — I10 Essential (primary) hypertension: Secondary | ICD-10-CM

## 2021-03-25 ENCOUNTER — Ambulatory Visit (AMBULATORY_SURGERY_CENTER): Payer: BC Managed Care – PPO | Admitting: *Deleted

## 2021-03-25 ENCOUNTER — Other Ambulatory Visit: Payer: Self-pay

## 2021-03-25 VITALS — Ht 75.0 in | Wt 275.0 lb

## 2021-03-25 DIAGNOSIS — Z86010 Personal history of colonic polyps: Secondary | ICD-10-CM

## 2021-03-25 MED ORDER — PEG 3350-KCL-NA BICARB-NACL 420 G PO SOLR: 4000.0000 mL | Freq: Once | ORAL | 0 refills | Status: AC

## 2021-03-25 NOTE — Progress Notes (Signed)
Pt verified name, DOB, address and insurance during PV today.  Pt mailed instruction packet of Emmi video, copy of consent form to read and not return, and instructions. PV completed over the phone.  Pt encouraged to call with questions or issues.  My Chart instructions to pt as well    No egg or soy allergy known to patient  No issues with past sedation with any surgeries or procedures Patient denies ever being told they had issues or difficulty with intubation  No FH of Malignant Hyperthermia No diet pills per patient No home 02 use per patient  No blood thinners per patient  Pt denies issues with constipation  No A fib or A flutter  EMMI video to pt or via [place] 19 guidelines implemented in [place] today with Pt and RN   Pt is fully vaccinated  for Covid   Due to the COVID-19 pandemic we are asking patients to follow certain guidelines.  Pt aware of COVID protocols and [facility] guidelines

## 2021-03-30 ENCOUNTER — Encounter: Payer: Self-pay | Admitting: Gastroenterology

## 2021-04-08 ENCOUNTER — Ambulatory Visit (AMBULATORY_SURGERY_CENTER): Payer: BC Managed Care – PPO | Admitting: Gastroenterology

## 2021-04-08 ENCOUNTER — Encounter: Payer: Self-pay | Admitting: Gastroenterology

## 2021-04-08 ENCOUNTER — Other Ambulatory Visit: Payer: Self-pay

## 2021-04-08 VITALS — BP 94/55 | HR 66 | Temp 98.0°F | Resp 11 | Ht 75.0 in | Wt 275.0 lb

## 2021-04-08 DIAGNOSIS — D123 Benign neoplasm of transverse colon: Secondary | ICD-10-CM

## 2021-04-08 DIAGNOSIS — Z86010 Personal history of colonic polyps: Secondary | ICD-10-CM | POA: Diagnosis present

## 2021-04-08 DIAGNOSIS — D122 Benign neoplasm of ascending colon: Secondary | ICD-10-CM

## 2021-04-08 HISTORY — PX: COLONOSCOPY: SHX174

## 2021-04-08 MED ORDER — SODIUM CHLORIDE 0.9 % IV SOLN: 500.0000 mL | Freq: Once | INTRAVENOUS | Status: DC

## 2021-04-08 NOTE — Patient Instructions (Signed)
YOU HAD AN ENDOSCOPIC PROCEDURE TODAY AT [facility]:   Refer to the procedure report that was given to you for any specific questions about what was found during the examination.  If the procedure report does not answer your questions, please call your gastroenterologist to clarify.  If you requested that your care partner not be given the details of your procedure findings, then the procedure report has been included in a sealed envelope for you to review at your convenience later.  Thank you for letting us take care of your healthcare needs today. Please see handouts given to you on POlyps.   YOU SHOULD EXPECT: Some feelings of bloating in the abdomen. Passage of more gas than usual.  Walking can help get rid of the air that was put into your GI tract during the procedure and reduce the bloating. If you had a lower endoscopy (such as a colonoscopy or flexible sigmoidoscopy) you may notice spotting of blood in your stool or on the toilet paper. If you underwent a bowel prep for your procedure, you may not have a normal bowel movement for a few days.  Please Note:  You might notice some irritation and congestion in your nose or some drainage.  This is from the oxygen used during your procedure.  There is no need for concern and it should clear up in a day or so.  SYMPTOMS TO REPORT IMMEDIATELY:  Following lower endoscopy (colonoscopy or flexible sigmoidoscopy):  Excessive amounts of blood in the stool  Significant tenderness or worsening of abdominal pains  Swelling of the abdomen that is new, acute  Fever of 100F or higher    For urgent or emergent issues, a gastroenterologist can be reached at any hour by calling [phone]. Do not use MyChart messaging for urgent concerns.    DIET:  We do recommend a small meal at first, but then you may proceed to your regular diet.  Drink plenty of fluids but you should avoid alcoholic beverages for 24 hours.  ACTIVITY:  You  should plan to take it easy for the rest of today and you should NOT DRIVE or use heavy machinery until tomorrow (because of the sedation medicines used during the test).    FOLLOW UP: Our staff will call the number listed on your records 48-72 hours following your procedure to check on you and address any questions or concerns that you may have regarding the information given to you following your procedure. If we do not reach you, we will leave a message.  We will attempt to reach you two times.  During this call, we will ask if you have developed any symptoms of COVID 19. If you develop any symptoms (ie: fever, flu-like symptoms, shortness of breath, cough etc.) before then, please call [phone].  If you test positive for Covid 19 in the 2 weeks post procedure, please call and report this information to [place].    If any biopsies were taken you will be contacted by phone or by letter within the next 1-3 weeks.  Please call us at [phone] if you have not heard about the biopsies in 3 weeks.    SIGNATURES/CONFIDENTIALITY: You and/or your care partner have signed paperwork which will be entered into your electronic medical record.  These signatures attest to the fact that that the information above on your After Visit Summary has been reviewed and is understood.  Full responsibility of the confidentiality of this discharge information lies with  you and/or your care-partner.

## 2021-04-08 NOTE — Progress Notes (Signed)
PT taken to PACU. Monitors in place. VSS. Report given to RN. 

## 2021-04-08 NOTE — Op Note (Signed)
[facility] Patient Name: Nathaniel Velazquez Procedure Date: 04/08/2021 10:34 AM MRN: [id number] Endoscopist: Milus Banister , MD Age: 60 Referring MD:  Date of Birth: 07-Nov-1960 Gender: Male Account #: [id number] Procedure:                Colonoscopy Indications:              High risk colon cancer surveillance: Personal                            history of colonic polyps; Colonoscopy 11/2014 three                            subCM adenomas, Colonoscopy 2019 five subCM polyps                            (mixed TAs, SSPs) Medicines:                Monitored Anesthesia Care Procedure:                Pre-Anesthesia Assessment:                           - Prior to the procedure, a History and Physical                            was performed, and patient medications and                            allergies were reviewed. The patient's tolerance of                            previous anesthesia was also reviewed. The risks                            and benefits of the procedure and the sedation                            options and risks were discussed with the patient.                            All questions were answered, and informed consent                            was obtained. Prior Anticoagulants: The patient has                            taken no previous anticoagulant or antiplatelet                            agents. ASA Grade Assessment: II - A patient with                            mild systemic disease. After reviewing the risks  and benefits, the patient was deemed in                            satisfactory condition to undergo the procedure.                           After obtaining informed consent, the colonoscope                            was passed under direct vision. Throughout the                            procedure, the patient's blood pressure, pulse, and                            oxygen saturations were monitored  continuously. The                            CF [id number] #[id number] was introduced through the anus                            and advanced to the the cecum, identified by                            appendiceal orifice and ileocecal valve. The                            colonoscopy was performed without difficulty. The                            patient tolerated the procedure well. The quality                            of the bowel preparation was good. The ileocecal                            valve, appendiceal orifice, and rectum were                            photographed. Scope In: 11:05:07 AM Scope Out: 11:16:23 AM Scope Withdrawal Time: 0 hours 9 minutes 37 seconds  Total Procedure Duration: 0 hours 11 minutes 16 seconds  Findings:                 Three sessile polyps were found in the transverse                            colon and ascending colon. The polyps were 3 to 6                            mm in size. These polyps were removed with a cold                            snare. Resection and retrieval were complete.  The exam was otherwise without abnormality on                            direct and retroflexion views. Complications:            No immediate complications. Estimated blood loss:                            None. Estimated Blood Loss:     Estimated blood loss: none. Impression:               - Three 3 to 6 mm polyps in the transverse colon                            and in the ascending colon, removed with a cold                            snare. Resected and retrieved.                           - The examination was otherwise normal on direct                            and retroflexion views. Recommendation:           - Patient has a contact number available for                            emergencies. The signs and symptoms of potential                            delayed complications were discussed with the                             patient. Return to normal activities tomorrow.                            Written discharge instructions were provided to the                            patient.                           - Resume previous diet.                           - Continue present medications.                           - Await pathology results. Milus Banister, MD 04/08/2021 11:18:58 AM This report has been signed electronically.

## 2021-04-08 NOTE — Progress Notes (Signed)
HPI: This is a man with Colonoscopy 11/2014 three subCM adenomas, Colonoscopy 2019 five subCM polyps (mixed TAs, SSPs)  ROS: complete GI ROS as described in HPI, all other review negative.  Constitutional:  No unintentional weight loss   Past Medical History:  Diagnosis Date   Arthritis    Cancer ([place])    skin cancer   GERD (gastroesophageal reflux disease)    Hypertension    Obesity    Steroid injection in rt knee    Past Surgical History:  Procedure Laterality Date   ANAL FISTULECTOMY  1987   APPENDECTOMY     COLONOSCOPY     KNEE SURGERY Bilateral    meniscal tear repairs   POLYPECTOMY     ROTATOR CUFF REPAIR Bilateral 2018   TONSILLECTOMY      Current Outpatient Medications  Medication Sig Dispense Refill   ENBREL MINI 50 MG/ML SOCT INSERT MINI CARTRIDGE INTO AUTOINJECTOR AND INJECT UNDER THE SKIN EVERY 7 DAYS. 4 mL 0   lisinopril (ZESTRIL) 20 MG tablet Take 1 tablet (20 mg total) by mouth daily. 14 tablet 0   metoprolol succinate (TOPROL-XL) 25 MG 24 hr tablet TAKE 1 TABLET DAILY 90 tablet 0   sildenafil (REVATIO) 20 MG tablet TAKE ONE TO THREE TABLETS BY MOUTH DAILY AS NEEDED 10 tablet 5   Cholecalciferol 25 MCG (1000 UT) capsule Take 1,000 Units by mouth daily.     clobetasol ointment (TEMOVATE) 0.05 % as needed. As directed     leflunomide (ARAVA) 10 MG tablet Take 1 tablet daily for 2 weeks then increase to 2 tablet daily. (Patient taking differently: Take 10 mg by mouth daily. Take 1 tablet daily) 60 tablet 2   Milk Thistle 250 MG CAPS Take by mouth.     nystatin-triamcinolone ointment (MYCOLOG) APPLY SPARINGLY TO THE AFFECTED AREA FOR UP TO 7 DAYS (Patient not taking: No sig reported) 30 g 0   psyllium (METAMUCIL) 58.6 % powder Take 1 packet by mouth 2 (two) times daily.      valACYclovir (VALTREX) 500 MG tablet Take by mouth. (Patient not taking: No sig reported)     vitamin E 180 MG (400 UNITS) capsule Take 400 Units by mouth daily.     Current  Facility-Administered Medications  Medication Dose Route Frequency Provider Last Rate Last Admin   0.9 %  sodium chloride infusion  500 mL Intravenous Once Milus Banister, MD       0.9 %  sodium chloride infusion  500 mL Intravenous Once Milus Banister, MD        Allergies as of 04/08/2021 - Review Complete 04/08/2021  Allergen Reaction Noted   Gabapentin Other (See Comments) 04/10/2014    Family History  Problem Relation Age of Onset   Hypertrophic cardiomyopathy Brother    Heart disease Mother    Hypertension Mother    Heart disease Father    Hypertension Father    Obesity Brother    Heart disease Brother    Healthy Daughter    Ankylosing spondylitis Son    Autoimmune disease Brother    Healthy Daughter    Colon cancer Neg Hx    Colon polyps Neg Hx    Esophageal cancer Neg Hx    Pancreatic cancer Neg Hx    Stomach cancer Neg Hx    Rectal cancer Neg Hx     Social History   Socioeconomic History   Marital status: Married    Spouse name: Not on file  Number of children: Not on file   Years of education: Not on file   Highest education level: Not on file  Occupational History   Not on file  Tobacco Use   Smoking status: Never   Smokeless tobacco: Never  Vaping Use   Vaping Use: Never used  Substance and Sexual Activity   Alcohol use: Yes    Comment: social   Drug use: No   Sexual activity: Never  Other Topics Concern   Not on file  Social History Narrative   Sales rep   Social Determinants of Health   Financial Resource Strain: Not on file  Food Insecurity: Not on file  Transportation Needs: Not on file  Physical Activity: Not on file  Stress: Not on file  Social Connections: Not on file  Intimate Partner Violence: Not on file     Physical Exam: BP (!) 146/77 (Patient Position: Sitting)   Pulse 71   Temp 98 F (36.7 C)   Ht 6\' 3"  (1.905 m)   Wt 275 lb (124.7 kg)   SpO2 96%   BMI 34.37 kg/m  Constitutional: generally  well-appearing Psychiatric: alert and oriented x3 Lungs: CTA bilaterally Heart: no MCR  Assessment and plan: 60 y.o. male with h/o polyps  For colonoscopy today  Care is appropriate for the ambulatory setting.  Owens Loffler, MD [facility] 04/08/2021, 10:57 AM

## 2021-04-08 NOTE — Progress Notes (Signed)
Pt's states no medical or surgical changes since previsit or office visit.   CHECK-IN-AER  V/S-CW

## 2021-04-08 NOTE — Progress Notes (Signed)
Called to room to assist during endoscopic procedure.  Patient ID and intended procedure confirmed with present staff. Received instructions for my participation in the procedure from the performing physician.  

## 2021-04-10 ENCOUNTER — Telehealth: Payer: Self-pay

## 2021-04-10 ENCOUNTER — Telehealth: Payer: Self-pay | Admitting: *Deleted

## 2021-04-10 NOTE — Telephone Encounter (Signed)
Follow up call made. 

## 2021-04-10 NOTE — Telephone Encounter (Signed)
Left message on follow up call. 

## 2021-04-14 ENCOUNTER — Encounter: Payer: Self-pay | Admitting: Gastroenterology

## 2021-04-22 ENCOUNTER — Other Ambulatory Visit: Payer: Self-pay | Admitting: Family Medicine

## 2021-04-22 DIAGNOSIS — N529 Male erectile dysfunction, unspecified: Secondary | ICD-10-CM

## 2021-04-23 NOTE — Telephone Encounter (Signed)
Due for follow-up to discuss sildenafil, courtesy refill given once but further refills will need to be decided with  office visit.  Note placed on prescription for pharmacy.

## 2021-04-23 NOTE — Telephone Encounter (Signed)
Patient is requesting a refill of the following medications: Requested Prescriptions   Pending Prescriptions Disp Refills   sildenafil (REVATIO) 20 MG tablet [Pharmacy Med Name: SILDENAFIL 20 MG TABLET] 10 tablet 5    Sig: TAKE 1-3 TABLETS BY MOUTH DAILY AS NEEDED    Date of patient request: 04/22/2021 Last office visit: 06/18/2020 Date of last refill: 03/17/2020 Last refill amount: 10 tablets 5 refills Follow up time period per chart:

## 2021-04-28 ENCOUNTER — Encounter: Payer: Self-pay | Admitting: Family Medicine

## 2021-05-18 ENCOUNTER — Telehealth: Payer: Self-pay | Admitting: Family Medicine

## 2021-05-18 DIAGNOSIS — I10 Essential (primary) hypertension: Secondary | ICD-10-CM

## 2021-05-18 MED ORDER — LISINOPRIL 20 MG PO TABS: 20.0000 mg | ORAL_TABLET | Freq: Every day | ORAL | 0 refills | Status: DC

## 2021-05-18 MED ORDER — METOPROLOL SUCCINATE ER 25 MG PO TB24: 25.0000 mg | ORAL_TABLET | Freq: Every day | ORAL | 0 refills | Status: DC

## 2021-05-18 NOTE — Telephone Encounter (Signed)
Patient has not been seen since 06/2020. Ok to refill lisinopril for 30 days?

## 2021-05-18 NOTE — Telephone Encounter (Signed)
..  Caller name:  Kassim Guertin  Caller callback [phone]  Encourage patient to contact the pharmacy for refills or they can request refills through [facility]  (Please schedule appointment if patient has not been seen in over a year)  Medication:  Lisinopril  Notes/Comments from patient:  Patient has an upcoming appt. But does not have enough medication to last till then  [place] [facility]:   [facility] on [address] in [place]  Please notify patient: It takes 48-72 hours to process rx refill requests Ask patient to call pharmacy to ensure rx is ready before heading there.   (CLINICAL TO FILL OR ROUTE PER PROTOCOLS)

## 2021-05-28 ENCOUNTER — Ambulatory Visit: Payer: BC Managed Care – PPO | Admitting: Family Medicine

## 2021-05-28 ENCOUNTER — Encounter: Payer: Self-pay | Admitting: Family Medicine

## 2021-05-28 VITALS — BP 136/74 | HR 67 | Temp 98.2°F | Resp 17 | Ht 75.0 in | Wt 271.8 lb

## 2021-05-28 DIAGNOSIS — I10 Essential (primary) hypertension: Secondary | ICD-10-CM

## 2021-05-28 DIAGNOSIS — N529 Male erectile dysfunction, unspecified: Secondary | ICD-10-CM

## 2021-05-28 DIAGNOSIS — M71379 Other bursal cyst, unspecified ankle and foot: Secondary | ICD-10-CM | POA: Diagnosis not present

## 2021-05-28 MED ORDER — METOPROLOL SUCCINATE ER 25 MG PO TB24: 25.0000 mg | ORAL_TABLET | Freq: Every day | ORAL | 1 refills | Status: DC

## 2021-05-28 MED ORDER — SILDENAFIL CITRATE 20 MG PO TABS: ORAL_TABLET | ORAL | 3 refills | Status: DC

## 2021-05-28 MED ORDER — LISINOPRIL 20 MG PO TABS: 20.0000 mg | ORAL_TABLET | Freq: Every day | ORAL | 1 refills | Status: DC

## 2021-05-28 NOTE — Patient Instructions (Signed)
No change in medications at this time.  We will request some labs from your other providers.  I have referred you to orthopedics, and we can perform necessary imaging at that time for your ankle but it appears to be a possible cyst.  I am referring you to a provider that does do musculoskeletal ultrasound as that may be helpful in evaluating that area.

## 2021-05-28 NOTE — Progress Notes (Signed)
Subjective:  Patient ID: Nathaniel Velazquez, male    DOB: April 03, 1961  Age: 60 y.o. MRN: [id number]  CC:  Chief Complaint  Patient presents with   Hypertension    Pt here for recheck in need of refills today, no concerns    Erectile Dysfunction    Pt is in need of refill sildanafil, needs to go to [facility]     HPI Nathaniel Velazquez presents for   Hypertension: Lisinopril 20 mg daily, Toprol-XL 25 mg daily. Father in law passed today in Hospice. Stroke night before Halloween.  Doing ok otherwise.  Has bloodwork with rheumatology every 3 months (psoriatic arthritis). Labs in October. Labs every 3 months.  Home readings: 120/80 range.  BP Readings from Last 3 Encounters:  05/28/21 136/74  04/08/21 (!) 94/55  06/18/20 (!) 154/94   Lab Results  Component Value Date   CREATININE 0.80 06/18/2020   ELevated LFT's.  No abd pain/n/v or skin/eye changes.  fatty liver, monitored by rheumatologist - levels elevated but stable.  [place] in 03/2016.  IMPRESSION: No gallstones or sonographic evidence of acute cholecystitis. Normal appearing common bile duct.   Heterogeneous hepatic echotexture likely reflects fatty infiltrative change. This was previously demonstrated.   Erectile dysfunction: Sidenafil 20mg  few times per month.Min HA if not eating only. No vision or hearing changes. No CP or dyspnea.   Obesity: Yardwork, walking few times per week. Weigh is coming down. Working on portion control. Occasional take out/fast food less than once per week. No sugar beverages.  Wt Readings from Last 3 Encounters:  05/28/21 271 lb 12.8 oz (123.3 kg)  04/08/21 275 lb (124.7 kg)  03/25/21 275 lb (124.7 kg)   HM: Covid booster - had bivalent booster recently.  Flu vaccine and shingrix #1 at [facility].   Bump on outside of ankle - past few years. Improved on own then returned. No pain, has gotten bigger over time. NKI.   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   BMI 38.0-38.9,adult 09/19/2018   Family history of Wegener's granulomatosis 09/19/2018   Moderate alcohol consumption 09/19/2018   Psoriasis 09/19/2018   Psoriatic arthritis ([place]) 09/19/2018   Rectal pain 06/05/2015   Hypertension    Obesity    Hyperlipidemia 11/21/2008   PALPITATIONS 10/30/2008   Past Medical History:  Diagnosis Date   Arthritis    Cancer ([place])    skin cancer   GERD (gastroesophageal reflux disease)    Hypertension    Obesity    Steroid injection in rt knee   Past Surgical History:  Procedure Laterality Date   ANAL FISTULECTOMY  1987   APPENDECTOMY     COLONOSCOPY     KNEE SURGERY Bilateral    meniscal tear repairs   POLYPECTOMY     ROTATOR CUFF REPAIR Bilateral 2018   TONSILLECTOMY     Allergies  Allergen Reactions   Gabapentin Other (See Comments)    Made very jittery   Prior to Admission medications   Medication Sig Start Date End Date Taking? Authorizing Provider  Cholecalciferol 25 MCG (1000 UT) capsule Take 1,000 Units by mouth daily.   Yes [provider]  clobetasol ointment (TEMOVATE) 0.05 % as needed. As directed 05/15/12  Yes [provider]  ENBREL MINI 50 MG/ML SOCT INSERT MINI CARTRIDGE INTO AUTOINJECTOR AND INJECT UNDER THE SKIN EVERY  7 DAYS. 06/05/20  Yes Ofilia Neas, PA-C  leflunomide (ARAVA) 10 MG tablet Take 1 tablet daily for 2 weeks then increase to 2 tablet daily. Patient taking differently: Take 10 mg by mouth daily. Take 1 tablet daily 01/14/20  Yes Yopp, Amber C, RPH-CPP  lisinopril (ZESTRIL) 20 MG tablet Take 1 tablet (20 mg total) by mouth daily. 05/18/21  Yes [name], MD  metoprolol succinate (TOPROL-XL) 25 MG 24 hr tablet Take 1 tablet (25 mg total) by mouth daily. 05/18/21  Yes [name], MD  Milk Thistle 250 MG CAPS Take by mouth.   Yes [provider]   nystatin-triamcinolone ointment (MYCOLOG) APPLY SPARINGLY TO THE AFFECTED AREA FOR UP TO 7 DAYS 06/23/18  Yes [name], MD  psyllium (METAMUCIL) 58.6 % powder Take 1 packet by mouth 2 (two) times daily.    Yes [provider]  sildenafil (REVATIO) 20 MG tablet TAKE 1-3 TABLETS BY MOUTH DAILY AS NEEDED 04/23/21  Yes [name], MD  valACYclovir (VALTREX) 500 MG tablet Take by mouth. 02/23/21  Yes [provider]  vitamin E 180 MG (400 UNITS) capsule Take 400 Units by mouth daily.   Yes [provider]   Social History   Socioeconomic History   Marital status: Married    Spouse name: Not on file   Number of children: Not on file   Years of education: Not on file   Highest education level: Not on file  Occupational History   Not on file  Tobacco Use   Smoking status: Never   Smokeless tobacco: Never  Vaping Use   Vaping Use: Never used  Substance and Sexual Activity   Alcohol use: Yes    Comment: social   Drug use: No   Sexual activity: Never  Other Topics Concern   Not on file  Social History Narrative   Sales rep   Social Determinants of Health   Financial Resource Strain: Not on file  Food Insecurity: Not on file  Transportation Needs: Not on file  Physical Activity: Not on file  Stress: Not on file  Social Connections: Not on file  Intimate Partner Violence: Not on file    Review of Systems  Constitutional:  Negative for fatigue and unexpected weight change.  Eyes:  Negative for visual disturbance.  Respiratory:  Negative for cough, chest tightness and shortness of breath.   Cardiovascular:  Negative for chest pain, palpitations and leg swelling.  Gastrointestinal:  Negative for abdominal pain and blood in stool.  Neurological:  Negative for dizziness, light-headedness and headaches.    Objective:   Vitals:   05/28/21 1605  BP: 136/74  Pulse: 67  Resp: 17  Temp: 98.2 F (36.8 C)  TempSrc: Temporal  SpO2: 97%   Weight: 271 lb 12.8 oz (123.3 kg)  Height: 6\' 3"  (1.905 m)     Physical Exam Vitals reviewed.  Constitutional:      Appearance: He is well-developed.  HENT:     Head: Normocephalic and atraumatic.  Neck:     Vascular: No carotid bruit or JVD.  Cardiovascular:     Rate and Rhythm: Normal rate and regular rhythm.     Heart sounds: Normal heart sounds. No murmur heard. Pulmonary:     Effort: Pulmonary effort is normal.     Breath sounds: Normal breath sounds. No rales.  Musculoskeletal:     Right lower leg: No edema.     Left lower leg: No edema.  Comments: Right ankle: Skin intact, firm cystic structure lateral, just above the malleolus, 3 cm.nontender. from, no bony ttp.   Skin:    General: Skin is warm and dry.  Neurological:     Mental Status: He is alert and oriented to person, place, and time.  Psychiatric:        Mood and Affect: Mood normal.      Assessment & Plan:  Nathaniel Velazquez is a 60 y.o. male . 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  Initially discussed imaging, but will refer to orthopedic sports specialist as [facility] ultrasound may be initial for step versus imaging with x-ray.  Erectile dysfunction, unspecified erectile dysfunction type - Plan: sildenafil (REVATIO) 20 MG tablet  - sildenafil Rx given - use lowest effective dose. Side effects discussed (including but not limited to headache/flushing, blue discoloration of vision, possible vascular steal and risk of cardiac effects if underlying unknown coronary artery disease, and permanent sensorineural hearing loss). Understanding expressed.  Essential hypertension - Plan: lisinopril (ZESTRIL) 20 MG tablet, metoprolol succinate (TOPROL-XL) 25 MG 24 hr tablet  -Stable, continue same regimen.  Labs deferred today as he has ongoing lab work with rheumatology.  We  will request those records.  Meds ordered this encounter  Medications   sildenafil (REVATIO) 20 MG tablet    Sig: TAKE 1-3 TABLETS BY MOUTH DAILY AS NEEDED    Dispense:  10 tablet    Refill:  3   lisinopril (ZESTRIL) 20 MG tablet    Sig: Take 1 tablet (20 mg total) by mouth daily.    Dispense:  90 tablet    Refill:  1   metoprolol succinate (TOPROL-XL) 25 MG 24 hr tablet    Sig: Take 1 tablet (25 mg total) by mouth daily.    Dispense:  90 tablet    Refill:  1   Patient Instructions  No change in medications at this time.  We will request some labs from your other providers.  I have referred you to orthopedics, and we can perform necessary imaging at that time for your ankle but it appears to be a possible cyst.  I am referring you to a provider that does do musculoskeletal ultrasound as that may be helpful in evaluating that area.     Signed,   [name], MD [place] 05/28/21 5:04 PM

## 2021-05-31 ENCOUNTER — Other Ambulatory Visit: Payer: Self-pay | Admitting: Family Medicine

## 2021-05-31 DIAGNOSIS — I10 Essential (primary) hypertension: Secondary | ICD-10-CM

## 2021-05-31 DIAGNOSIS — N529 Male erectile dysfunction, unspecified: Secondary | ICD-10-CM

## 2021-06-01 NOTE — Telephone Encounter (Signed)
Patient is requesting a refill of the following medications: Requested Prescriptions   Pending Prescriptions Disp Refills   sildenafil (REVATIO) 20 MG tablet [Pharmacy Med Name: SILDENAFIL 20 MG TABLET] 10 tablet 3    Sig: TAKE ONE TO THREE TABLETS BY MOUTH DAILY AS NEEDED    Date of patient request: 05/31/2021 Last office visit: 05/28/2021 Date of last refill: 05/28/2021 Last refill amount: 10 tablets 3 refills  Follow up time period per chart: n/a   Patient medication was already sent to the pharmacy

## 2021-06-22 DIAGNOSIS — M25571 Pain in right ankle and joints of right foot: Secondary | ICD-10-CM | POA: Insufficient documentation

## 2021-07-08 ENCOUNTER — Other Ambulatory Visit: Payer: Self-pay | Admitting: Family Medicine

## 2021-07-08 DIAGNOSIS — N529 Male erectile dysfunction, unspecified: Secondary | ICD-10-CM

## 2021-07-22 ENCOUNTER — Other Ambulatory Visit: Payer: Self-pay | Admitting: Family Medicine

## 2021-07-22 DIAGNOSIS — N529 Male erectile dysfunction, unspecified: Secondary | ICD-10-CM

## 2021-07-22 NOTE — Telephone Encounter (Signed)
Error

## 2021-08-29 ENCOUNTER — Other Ambulatory Visit: Payer: Self-pay | Admitting: Family Medicine

## 2021-08-29 DIAGNOSIS — I10 Essential (primary) hypertension: Secondary | ICD-10-CM

## 2021-10-05 ENCOUNTER — Ambulatory Visit (INDEPENDENT_AMBULATORY_CARE_PROVIDER_SITE_OTHER): Payer: BC Managed Care – PPO | Admitting: Family Medicine

## 2021-10-05 ENCOUNTER — Encounter: Payer: Self-pay | Admitting: Family Medicine

## 2021-10-05 VITALS — BP 128/76 | HR 67 | Temp 97.9°F | Resp 17 | Ht 75.0 in | Wt 279.6 lb

## 2021-10-05 DIAGNOSIS — R81 Glycosuria: Secondary | ICD-10-CM | POA: Diagnosis not present

## 2021-10-05 DIAGNOSIS — R310 Gross hematuria: Secondary | ICD-10-CM | POA: Diagnosis not present

## 2021-10-05 DIAGNOSIS — R1031 Right lower quadrant pain: Secondary | ICD-10-CM

## 2021-10-05 LAB — BASIC METABOLIC PANEL
BUN: 14 mg/dL (ref 6–23)
CO2: 22 mEq/L (ref 19–32)
Calcium: 9.7 mg/dL (ref 8.4–10.5)
Chloride: 98 mEq/L (ref 96–112)
Creatinine, Ser: 0.83 mg/dL (ref 0.40–1.50)
GFR: 94.34 mL/min (ref >60.00)
Glucose, Bld: 328 mg/dL — ABNORMAL HIGH (ref 70–99)
Potassium: 4.8 mEq/L (ref 3.5–5.1)
Sodium: 129 mEq/L — ABNORMAL LOW (ref 135–145)

## 2021-10-05 LAB — CBC
HCT: 43.5 % (ref 39.0–52.0)
Hemoglobin: 14.6 g/dL (ref 13.0–17.0)
MCHC: 33.6 g/dL (ref 30.0–36.0)
MCV: 97.1 fl (ref 78.0–100.0)
Platelets: 113 10*3/uL — ABNORMAL LOW (ref 150.0–400.0)
RBC: 4.48 Mil/uL (ref 4.22–5.81)
RDW: 13.5 % (ref 11.5–15.5)
WBC: 5.2 10*3/uL (ref 4.0–10.5)

## 2021-10-05 LAB — URINALYSIS, MICROSCOPIC ONLY

## 2021-10-05 LAB — POCT URINALYSIS DIP (MANUAL ENTRY)
Bilirubin, UA: NEGATIVE
Glucose, UA: >=1000 mg/dL — AB
Ketones, POC UA: NEGATIVE mg/dL
Leukocytes, UA: NEGATIVE
Nitrite, UA: NEGATIVE
Protein Ur, POC: NEGATIVE mg/dL
Spec Grav, UA: 1.015 (ref 1.010–1.025)
Urobilinogen, UA: 0.2 E.U./dL
pH, UA: 6 (ref 5.0–8.0)

## 2021-10-05 LAB — PSA: PSA: 1.04 ng/mL (ref 0.10–4.00)

## 2021-10-05 NOTE — Progress Notes (Signed)
? ?Subjective:  ?Patient ID: Nathaniel Velazquez, male    DOB: 1961-04-29  Age: 61 y.o. MRN: [id number] ? ?CC:  ?Chief Complaint  ?Patient presents with  ? Hematuria  ?  Pt notes blood in urine, notes started over weekend, no injury or pain noted, pt notes this has happened back in January for a few days while on prednisone taper   ? ? ?HPI ?Nathaniel Velazquez presents for  ? ?Hematuria  ?Painless blood noted in urine over the weekend. Noticed 2 days ago with initial morning void - noticed at beginning of urine stream, then cleared. ? Flecks of blood in urine at times. ?No dysuria, frequency or new urgency.  ?Nocturia - 2 times for years, no recent changes.  ?Minimal R groin discomfort between abdomen and testicle - slight sensation, not painful.  ?No hx of kidney stones.  ?No new sexual contacts, no penile d/c  ? ?Episode once in January when on prednisone for a few days.  ?Episode once late last year - painless, resolved in few days.  ? 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  ?Lab Results  ?Component Value Date  ? CREATININE 0.80 06/18/2020  ? ?Lab Results  ?Component Value Date  ? PSA 0.4 04/06/2019  ? ? ? ? ? ? ?History ?Patient Active Problem List  ? Diagnosis Date Noted  ? High risk medication use 01/14/2020  ? Pain in left knee 06/28/2019  ? 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 BMI 38.0-38.9,adult 09/19/2018  ? Family history of Wegener's granulomatosis 09/19/2018  ? Moderate alcohol consumption 09/19/2018  ? Psoriasis 09/19/2018  ? Psoriatic arthritis ([place]) 09/19/2018  ? Rectal pain 06/05/2015  ? Hypertension   ? Obesity   ? Hyperlipidemia 11/21/2008  ? PALPITATIONS 10/30/2008  ? ?Past Medical History:  ?Diagnosis Date  ? Arthritis   ? Cancer [facility])   ? skin cancer  ? GERD (gastroesophageal reflux  disease)   ? Hypertension   ? Obesity   ? Steroid injection in rt knee  ? ?Past Surgical History:  ?Procedure Laterality Date  ? ANAL FISTULECTOMY  1987  ? APPENDECTOMY    ? COLONOSCOPY    ? KNEE SURGERY Bilateral   ? meniscal tear repairs  ? POLYPECTOMY    ? ROTATOR CUFF REPAIR Bilateral 2018  ? TONSILLECTOMY    ? ?Allergies  ?Allergen Reactions  ? Gabapentin Other (See Comments)  ?  Made very jittery  ? ?Prior to Admission medications   ?Medication Sig Start Date End Date Taking? Authorizing Provider  ?clobetasol ointment (TEMOVATE) 0.05 % as needed. As directed 05/15/12  Yes [provider]  ?ENBREL MINI 50 MG/ML SOCT INSERT MINI CARTRIDGE INTO AUTOINJECTOR AND INJECT UNDER THE SKIN EVERY 7 DAYS. 06/05/20  Yes Ofilia Neas, PA-C  ?leflunomide (ARAVA) 10 MG tablet Take 1 tablet daily for 2 weeks then increase to 2 tablet daily. ?Patient taking differently: Take 10 mg by mouth daily. Take 1 tablet daily 01/14/20  Yes Yopp, Amber C, RPH-CPP  ?lisinopril (ZESTRIL) 20 MG tablet Take 1 tablet (20 mg total) by mouth daily. 05/28/21  Yes [name], MD  ?metoprolol succinate (TOPROL-XL) 25 MG 24 hr tablet Take 1 tablet (25 mg total) by mouth daily. 05/28/21  Yes [name], MD  ?nystatin-triamcinolone ointment (MYCOLOG) APPLY SPARINGLY TO THE AFFECTED  AREA FOR UP TO 7 DAYS 06/23/18  Yes [name], MD  ?psyllium (METAMUCIL) 58.6 % powder Take 1 packet by mouth 2 (two) times daily.    Yes [provider]  ?sildenafil (REVATIO) 20 MG tablet TAKE ONE TO THREE TABLETS BY MOUTH DAILY AS NEEDED 07/22/21  Yes [name], MD  ?valACYclovir (VALTREX) 500 MG tablet Take by mouth. 02/23/21  Yes [provider]  ?vitamin E 180 MG (400 UNITS) capsule Take 400 Units by mouth daily.   Yes [provider]  ?Cholecalciferol 25 MCG (1000 UT) capsule Take 1,000 Units by mouth daily. ?Patient not taking: Reported on 10/05/2021    [provider]  ?Milk Thistle 250 MG CAPS  Take by mouth. ?Patient not taking: Reported on 10/05/2021    [provider]  ? ?Social History  ? ?Socioeconomic History  ? Marital status: Married  ?  Spouse name: Not on file  ? Number of children: Not on file  ? Years of education: Not on file  ? Highest education level: Not on file  ?Occupational History  ? Not on file  ?Tobacco Use  ? Smoking status: Never  ? Smokeless tobacco: Never  ?Vaping Use  ? Vaping Use: Never used  ?Substance and Sexual Activity  ? Alcohol use: Yes  ?  Comment: social  ? Drug use: No  ? Sexual activity: Never  ?Other Topics Concern  ? 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 ?Social Determinants of Health  ? ?Financial Resource Strain: Not on file  ?Food Insecurity: Not on file  ?Transportation Needs: Not on file  ?Physical Activity: Not on file  ?Stress: Not on file  ?Social Connections: Not on file  ?Intimate Partner Violence: Not on file  ? ? ?Review of Systems ? ?Per HPI  ?Objective:  ? ?Vitals:  ? 10/05/21 1153  ?BP: 128/76  ?Pulse: 67  ?Resp: 17  ?Temp: 97.9 ?F (36.6 ?C)  ?TempSrc: Temporal  ?SpO2: 96%  ?Weight: 279 lb 9.6 oz (126.8 kg)  ?Height: '6\' 3"'$  (1.905 m)  ? ? ? ?Physical Exam ?Vitals reviewed.  ?Constitutional:   ?   General: He is not in acute distress. ?   Appearance: Normal appearance. He is well-developed.  ?HENT:  ?   Head: Normocephalic and atraumatic.  ?Cardiovascular:  ?   Rate and Rhythm: Normal rate.  ?Pulmonary:  ?   Effort: Pulmonary effort is normal.  ?Abdominal:  ?   Hernia: There is no hernia in the left inguinal area or right inguinal area.  ?Genitourinary: ?   Penis: Normal. No tenderness, discharge, swelling or lesions.   ?   Testes: Normal.     ?   Right: Tenderness not present.     ?   Left: Tenderness not present.  ?   Epididymis:  ?   Right: Normal.  ?   Left: Normal.  ?Lymphadenopathy:  ?   Lower Body: No right inguinal adenopathy. No left inguinal adenopathy.  ?Skin: ?   General: Skin is warm and dry.  ?Neurological:  ?    Mental Status: He is alert and oriented to person, place, and time.  ?Psychiatric:     ?   Mood and Affect: Mood normal.  ? ? ?Results for orders placed or performed in visit on 10/05/21  ?POCT urinalysis dipstick  ?Result Value Ref Range  ? Color, UA yellow yellow  ? Clarity, UA clear clear  ? Glucose, UA >=  1,000 (A) negative mg/dL  ? Bilirubin, UA negative negative  ? Ketones, POC UA negative negative mg/dL  ? Spec Grav, UA 1.015 1.010 - 1.025  ? Blood, UA small (A) negative  ? pH, UA 6.0 5.0 - 8.0  ? Protein Ur, POC negative negative mg/dL  ? Urobilinogen, UA 0.2 0.2 or 1.0 E.U./dL  ? Nitrite, UA Negative Negative  ? Leukocytes, UA Negative Negative  ?' ? ? ?Assessment & Plan:  ?JAHMAD PETRICH is a 61 y.o. male . ?Gross hematuria - Plan: POCT urinalysis dipstick, Basic metabolic panel, CBC, PSA, Urine Microscopic, CT ABDOMEN PELVIS WO CONTRAST ? ?Right lower quadrant abdominal pain - Plan: CT ABDOMEN PELVIS WO CONTRAST ? ?Multiple episodes of gross hematuria as above, minimal symptoms right lower quadrant/right flank.  Differential includes possible nephrolith without prior history.  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  Prostatitis/enlargement possible but no pain or other urinary symptoms.  Differential also includes bladder or prostate nodule/mass. ? -Check urinalysis with micro, PSA, CBC, and imaging with CT abdomen/pelvis.  Likely will have urology follow-up depending on those results. ? -Glycosuria noted, check glucose on BMP.  Most recent A1c in 2021 at prediabetes level. ? -RTC precautions ? ? ?No orders of the defined types were placed in this encounter. ? ?Patient Instructions  ?I will order some urine and blood tests as well as ordered a CT scan of the abdomen to evaluate for possible kidney stones, or other concerns within the bladder or kidneys.  Depending on these results I will likely refer you to urology as the  blood has been present previously.  If any worsening pain, or new urinary symptoms let me know right away.  Thanks for coming in today and let me know if there are questions. ? ? ? ?Signed,  ? ?[name], M

## 2021-10-05 NOTE — Patient Instructions (Signed)
I will order some urine and blood tests as well as ordered a CT scan of the abdomen to evaluate for possible kidney stones, or other concerns within the bladder or kidneys.  Depending on these results I will likely refer you to urology as the blood has been present previously.  If any worsening pain, or new urinary symptoms let me know right away.  Thanks for coming in today and let me know if there are questions. ?

## 2021-10-08 ENCOUNTER — Telehealth: Payer: Self-pay | Admitting: Family Medicine

## 2021-10-08 NOTE — Telephone Encounter (Signed)
Please schedule appointment with me in the next 1 week to review labs, hyperglycemia, low platelets ?

## 2021-10-13 ENCOUNTER — Ambulatory Visit (INDEPENDENT_AMBULATORY_CARE_PROVIDER_SITE_OTHER)
Admission: RE | Admit: 2021-10-13 | Discharge: 2021-10-13 | Disposition: A | Discharge status: Home / Self Care | Payer: BC Managed Care – PPO | Source: Ambulatory Visit | Attending: Family Medicine | Admitting: Family Medicine

## 2021-10-13 ENCOUNTER — Other Ambulatory Visit: Payer: Self-pay

## 2021-10-13 DIAGNOSIS — R310 Gross hematuria: Secondary | ICD-10-CM

## 2021-10-13 DIAGNOSIS — R1031 Right lower quadrant pain: Secondary | ICD-10-CM | POA: Diagnosis not present

## 2021-10-15 ENCOUNTER — Encounter: Payer: Self-pay | Admitting: Family Medicine

## 2021-10-15 ENCOUNTER — Ambulatory Visit (INDEPENDENT_AMBULATORY_CARE_PROVIDER_SITE_OTHER): Payer: BC Managed Care – PPO | Admitting: Family Medicine

## 2021-10-15 VITALS — BP 132/78 | HR 66 | Temp 98.1°F | Wt 274.0 lb

## 2021-10-15 DIAGNOSIS — Q631 Lobulated, fused and horseshoe kidney: Secondary | ICD-10-CM | POA: Diagnosis not present

## 2021-10-15 DIAGNOSIS — D696 Thrombocytopenia, unspecified: Secondary | ICD-10-CM | POA: Diagnosis not present

## 2021-10-15 DIAGNOSIS — R7303 Prediabetes: Secondary | ICD-10-CM | POA: Diagnosis not present

## 2021-10-15 DIAGNOSIS — R319 Hematuria, unspecified: Secondary | ICD-10-CM | POA: Diagnosis not present

## 2021-10-15 DIAGNOSIS — R7989 Other specified abnormal findings of blood chemistry: Secondary | ICD-10-CM | POA: Diagnosis not present

## 2021-10-15 DIAGNOSIS — R935 Abnormal findings on diagnostic imaging of other abdominal regions, including retroperitoneum: Secondary | ICD-10-CM

## 2021-10-15 DIAGNOSIS — E119 Type 2 diabetes mellitus without complications: Secondary | ICD-10-CM

## 2021-10-15 DIAGNOSIS — R310 Gross hematuria: Secondary | ICD-10-CM | POA: Diagnosis not present

## 2021-10-15 LAB — POCT URINALYSIS DIP (MANUAL ENTRY)
Bilirubin, UA: NEGATIVE
Blood, UA: NEGATIVE
Glucose, UA: NEGATIVE mg/dL
Ketones, POC UA: NEGATIVE mg/dL
Leukocytes, UA: NEGATIVE
Nitrite, UA: NEGATIVE
Protein Ur, POC: NEGATIVE mg/dL
Spec Grav, UA: 1.015 (ref 1.010–1.025)
Urobilinogen, UA: 0.2 E.U./dL
pH, UA: 6 (ref 5.0–8.0)

## 2021-10-15 LAB — GLUCOSE, POCT (MANUAL RESULT ENTRY): POC Glucose: 156 mg/dl — AB (ref 70–99)

## 2021-10-15 LAB — POCT GLYCOSYLATED HEMOGLOBIN (HGB A1C): Hemoglobin A1C: 7.5 % — AB (ref 4.0–5.6)

## 2021-10-15 MED ORDER — BLOOD GLUCOSE METER KIT: PACK | 0 refills | Status: AC

## 2021-10-15 NOTE — Progress Notes (Signed)
? ?Subjective:  ?Patient ID: Nathaniel Velazquez, male    DOB: [birth year]  Age: 61 y.o. MRN: [id number] ? ?CC:  ?Chief Complaint  ?Patient presents with  ? Hematuria  ?  He did get the CT scan this morning on his my chart, no other sxs patient states he is doing better  ? ? ?HPI ?Nathaniel Velazquez presents for  ? ?Gross hematuria ?Follow-up from office visit 10 days ago, 2 episodes of gross hematuria discussed at that time.  Microscopic hematuria noted at 11-20 per high-power field on last urinalysis. ?PSA normal at 1.04 ?Thrombocytopenia with platelets 113, lower than 203 in July 2021.  Previously had been normal. ?No blood since last visit. None in past 8 days.  ? ?CT renal stone study performed 2 days ago: ?IMPRESSION: ?1. Query cirrhotic morphology of the liver. No focal liver lesions ?identified. Please note that liver protocol enhanced MR and CT are ?the most sensitive tests for the screening detection of ?hepatocellular carcinoma in the high risk setting of cirrhosis. ?2. Nonspecific vague stranding/haziness along the origin of the ?celiac and superior mesenteric arteries. ?3. Horseshoe kidney. ?4.  Aortic Atherosclerosis (ICD10-I70.0). ? ?Lab Results  ?Component Value Date  ? ALT 95 (H) 03/28/2020  ? AST 52 (H) 03/28/2020  ? ALKPHOS 48 06/12/2018  ? BILITOT 0.8 02/08/2020  ?LFTs have been elevated previously, noted by rheumatology in September 2021 with planned repeat testing, avoidance of alcohol, and possible GI referral. ?Lft's have been elevated but stable at rheumatology. Dr. Amil Amen - tested every 3 months with use of Enbrel. ?No abdominal pain, nausea, vomiting. ? ?Hyperglycemia: ?Noted on BMP March 20.  Glucose 328.  He did have glycosuria on his last urine test.  History of prediabetes based on A1c of 5.9 in 2021.  ?Variable blood sugars when younger.  ?Fruit juice, fruits prior to last visit.  ?Has been cutting back on carbs, juice, fruit.  ?A1c 5.9 on 06/18/20.  ?Lab Results  ?Component Value Date  ?  HGBA1C 7.5 (A) 10/15/2021  ? ?Wt Readings from Last 3 Encounters:  ?10/15/21 274 lb (124.3 kg)  ?10/05/21 279 lb 9.6 oz (126.8 kg)  ?05/28/21 271 lb 12.8 oz (123.3 kg)  ? ?Labs today.  ?Results for orders placed or performed in visit on 10/15/21  ?POCT urinalysis dipstick  ?Result Value Ref Range  ? Color, UA yellow yellow  ? Clarity, UA clear clear  ? Glucose, UA negative negative mg/dL  ? Bilirubin, UA negative negative  ? Ketones, POC UA negative negative mg/dL  ? Spec Grav, UA 1.015 1.010 - 1.025  ? Blood, UA negative negative  ? pH, UA 6.0 5.0 - 8.0  ? Protein Ur, POC negative negative mg/dL  ? Urobilinogen, UA 0.2 0.2 or 1.0 E.U./dL  ? Nitrite, UA Negative Negative  ? Leukocytes, UA Negative Negative  ?POCT glycosylated hemoglobin (Hb A1C)  ?Result Value Ref Range  ? Hemoglobin A1C 7.5 (A) 4.0 - 5.6 %  ? HbA1c POC (<> result, manual entry)    ? HbA1c, POC (prediabetic range)    ? HbA1c, POC (controlled diabetic range)    ?POCT glucose (manual entry)  ?Result Value Ref Range  ? POC Glucose 156 (A) 70 - 99 mg/dl  ? ? ? ? ? ?History ?Patient Active Problem List  ? Diagnosis Date Noted  ? High risk medication use 01/14/2020  ? Pain in left knee 06/28/2019  ? 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 BMI 38.0-38.9,adult 09/19/2018  ? Family history  of Wegener's granulomatosis 09/19/2018  ? Moderate alcohol consumption 09/19/2018  ? Psoriasis 09/19/2018  ? Psoriatic arthritis ([place]) 09/19/2018  ? Rectal pain 06/05/2015  ? Hypertension   ? Obesity   ? Hyperlipidemia 11/21/2008  ? PALPITATIONS 10/30/2008  ? ?Past Medical History:  ?Diagnosis Date  ? Arthritis   ? Cancer [facility])   ? skin cancer  ? GERD (gastroesophageal reflux disease)   ? Hypertension   ? Obesity   ? Steroid injection in rt knee  ? ?Past Surgical History:  ?Procedure Laterality Date  ? ANAL FISTULECTOMY  1987  ? APPENDECTOMY    ? COLONOSCOPY    ? KNEE SURGERY Bilateral   ? meniscal tear repairs  ? POLYPECTOMY    ? ROTATOR CUFF REPAIR Bilateral 2018   ? TONSILLECTOMY    ? ?Allergies  ?Allergen Reactions  ? Gabapentin Other (See Comments)  ?  Made very jittery  ? ?Prior to Admission medications   ?Medication Sig Start Date End Date Taking? Authorizing Provider  ?clobetasol ointment (TEMOVATE) 0.05 % as needed. As directed 05/15/12  Yes [provider]  ?ENBREL MINI 50 MG/ML SOCT INSERT MINI CARTRIDGE INTO AUTOINJECTOR AND INJECT UNDER THE SKIN EVERY 7 DAYS. 06/05/20  Yes Ofilia Neas, PA-C  ?leflunomide (ARAVA) 10 MG tablet Take 1 tablet daily for 2 weeks then increase to 2 tablet daily. ?Patient taking differently: Take 10 mg by mouth daily. Take 1 tablet daily 01/14/20  Yes Yopp, Amber C, RPH-CPP  ?lisinopril (ZESTRIL) 20 MG tablet Take 1 tablet (20 mg total) by mouth daily. 05/28/21  Yes [name], MD  ?metoprolol succinate (TOPROL-XL) 25 MG 24 hr tablet Take 1 tablet (25 mg total) by mouth daily. 05/28/21  Yes [name], MD  ?nystatin-triamcinolone ointment (MYCOLOG) APPLY SPARINGLY TO THE AFFECTED AREA FOR UP TO 7 DAYS 06/23/18  Yes [name], MD  ?psyllium (METAMUCIL) 58.6 % powder Take 1 packet by mouth 2 (two) times daily.    Yes [provider]  ?sildenafil (REVATIO) 20 MG tablet TAKE ONE TO THREE TABLETS BY MOUTH DAILY AS NEEDED 07/22/21  Yes [name], MD  ?valACYclovir (VALTREX) 500 MG tablet Take by mouth. 02/23/21  Yes [provider]  ?Cholecalciferol 25 MCG (1000 UT) capsule Take 1,000 Units by mouth daily. ?Patient not taking: Reported on 10/15/2021    [provider]  ?Milk Thistle 250 MG CAPS Take by mouth. ?Patient not taking: Reported on 10/05/2021    [provider]  ?vitamin E 180 MG (400 UNITS) capsule Take 400 Units by mouth daily. ?Patient not taking: Reported on 10/15/2021    [provider]  ? ?Social History  ? ?Socioeconomic History  ? Marital status: Married  ?  Spouse name: Not on file  ? Number of children: Not on file  ? Years of education: Not on file   ? Highest education level: Not on file  ?Occupational History  ? Not on file  ?Tobacco Use  ? Smoking status: Never  ? Smokeless tobacco: Never  ?Vaping Use  ? Vaping Use: Never used  ?Substance and Sexual Activity  ? Alcohol use: Yes  ?  Comment: social  ? Drug use: No  ? Sexual activity: Never  ?Other Topics Concern  ? 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 ?Social Determinants of Health  ? ?Financial Resource Strain: Not on file  ?Food Insecurity: Not on file  ?Transportation Needs: Not on file  ?  Physical Activity: Not on file  ?Stress: Not on file  ?Social Connections: Not on file  ?Intimate Partner Violence: Not on file  ? ? ?Review of Systems ?Per HPI ? ?Objective:  ? ?Vitals:  ? 10/15/21 1534  ?BP: 132/78  ?Pulse: 66  ?Temp: 98.1 ?F (36.7 ?C)  ?TempSrc: Temporal  ?SpO2: 96%  ?Weight: 274 lb (124.3 kg)  ? ? ? ?Physical Exam ?Vitals reviewed.  ?Constitutional:   ?   General: He is not in acute distress. ?   Appearance: He is well-developed.  ?HENT:  ?   Head: Normocephalic and atraumatic.  ?Neck:  ?   Vascular: No carotid bruit or JVD.  ?Cardiovascular:  ?   Rate and Rhythm: Normal rate and regular rhythm.  ?   Heart sounds: Normal heart sounds. No murmur heard. ?Pulmonary:  ?   Effort: Pulmonary effort is normal.  ?   Breath sounds: Normal breath sounds. No rales.  ?Abdominal:  ?   General: Abdomen is flat.  ?   Tenderness: There is no abdominal tenderness.  ?Musculoskeletal:  ?   Right lower leg: No edema.  ?   Left lower leg: No edema.  ?Skin: ?   General: Skin is warm and dry.  ?Neurological:  ?   Mental Status: He is alert and oriented to person, place, and time.  ?Psychiatric:     ?   Mood and Affect: Mood normal.  ? ? ?41 minutes spent during visit, including chart review, review and discussion of imaging.  Medication review in the office for potential contributors to abnormal results, counseling and assimilation of information, exam, discussion of plan including referrals to urology and  gastroenterology, and chart completion.  ? ? ? ?Assessment & Plan:  ?Nathaniel Velazquez is a 61 y.o. male . ?Hematuria, unspecified type - Plan: POCT urinalysis dipstick ?Gross hematuria - Plan: [place]

## 2021-10-15 NOTE — Patient Instructions (Addendum)
I will refer you to urology to discuss the blood in the urine but CT scan without any concerns regarding that symptom. ? ?There were a few possible irregularities on the CT scan and recommend discussion further with gastroenterology to decide on further imaging or other testing.  I have placed that referral.  They should be calling you in the next few weeks. ? ?Platelets were low last visit.  I will recheck those today.  Avoid any aspirin products for now. ? ?Unfortunately blood sugar is at the level of diabetes when evaluating the past 22-monthaverage.  Keep up the good work with diet.  I suspect we can control that with diet alone and not medication.  Check blood sugars intermittently, see reference range below.  If you notice sugars above 250 or 300s, let me know as I would recommend medication at that level. ? ?Thanks for coming in today.  Let me know if there are questions. ? ? ?Type 2 Diabetes Mellitus, Self-Care, Adult ?Caring for yourself after you have been diagnosed with type 2 diabetes (type 2 diabetes mellitus) means keeping your blood sugar (glucose) under control with a balance of: ?Nutrition. ?Exercise. ?Lifestyle changes. ?Medicines or insulin, if needed. ?Support from your team of health care providers and others. ?What are the risks? ?Having type 2 diabetes can put you at risk for other long-term (chronic) conditions, such as heart disease and kidney disease. Your health care provider may prescribe medicines to help prevent complications from diabetes. ?How to monitor your blood glucose ? ?Check your blood glucose every day or as often as told by your health care provider. ?Have your A1C (hemoglobin A1C) level checked two or more times a year, or as often as told by your health care provider. ?Your health care provider will set personalized treatment goals for you. Generally, the goal of treatment is to maintain the following blood glucose levels: ?Before meals: 80-130 mg/dL (4.4-7.2  mmol/L). ?After meals: below 180 mg/dL (10 mmol/L). ?A1C level: less than 7%. ?How to manage hyperglycemia and hypoglycemia ?Hyperglycemia symptoms ?Hyperglycemia, also called high blood glucose, occurs when blood glucose is too high. Make sure you know the early signs of hyperglycemia, such as: ?Increased thirst. ?Hunger. ?Feeling very tired. ?Needing to urinate more often than usual. ?Blurry vision. ?Hypoglycemia symptoms ?Hypoglycemia, also called low blood glucose, occurs with a blood glucose level at or below 70 mg/dL (3.9 mmol/L). Diabetes medicines lower your blood glucose and can cause hypoglycemia. The risk for hypoglycemia increases during or after exercise, during sleep, during illness, and when skipping meals or not eating for a long time (fasting). ?It is important to know the symptoms of hypoglycemia and treat it right away. Always have a 15-gram rapid-acting carbohydrate snack with you to treat low blood glucose. Family members and close friends should also know the symptoms and understand how to treat hypoglycemia, in case you are not able to treat yourself. Symptoms may include: ?Hunger. ?Anxiety. ?Sweating and feeling clammy. ?Dizziness or feeling light-headed. ?Sleepiness. ?Increased heart rate. ?Irritability. ?Tingling or numbness around the mouth, lips, or tongue. ?Restless sleep. ?Severe hypoglycemia is when your blood glucose level is at or below 54 mg/dL (3 mmol/L). ?Severe hypoglycemia is an emergency. Do not wait to see if the symptoms will go away. Get medical help right away. Call your local emergency services (911 in the [place] Do not drive yourself to the hospital. ?If you have severe hypoglycemia and you cannot eat or drink, you may need glucagon. A family  member or close friend should learn how to check your blood glucose and how to give you glucagon. Ask your health care provider if you need to have an emergency glucagon kit available. ?Follow these instructions at  home: ?Medicines ?Take prescribed insulin or diabetes medicines as told by your health care provider. ?Do not run out of insulin or other diabetes medicines. Plan ahead so you always have these available. ?If you use insulin, adjust your dosage based on your physical activity and what foods you eat. Your health care provider will tell you how to adjust your dosage. ?Take over-the-counter and prescription medicines only as told by your health care provider. ?Eating and drinking ?What you eat and drink affects your blood glucose and your insulin dosage. Making good choices helps to control your diabetes and prevent other health problems. A healthy meal plan includes eating lean proteins, complex carbohydrates, fresh fruits and vegetables, low-fat dairy products, and healthy fats. ?Make an appointment to see a registered dietitian to help you create an eating plan that is right for you. Make sure that you: ?Follow instructions from your health care provider about eating or drinking restrictions. ?Drink enough fluid to keep your urine pale yellow. ?Keep a record of the carbohydrates that you eat. Do this by reading food labels and learning the standard serving sizes of foods. ?Follow your sick-day plan whenever you cannot eat or drink as usual. Make this plan in advance with your health care provider. ? ?Activity ?Stay active. Exercise regularly, as told by your health care provider. This may include: ?Stretching and doing strength exercises, such as yoga or weight lifting, two or more times a week. ?Doing 150 minutes or more of moderate-intensity or vigorous-intensity exercise each week. This could be brisk walking, biking, or water aerobics. ?Spread out your activity over 3 or more days of the week. ?Do not go more than 2 days in a row without doing some kind of physical activity. ?When you start a new exercise or activity, work with your health care provider to adjust your insulin, medicines, or food intake as  needed. ?Lifestyle ?Do not use any products that contain nicotine or tobacco. These products include cigarettes, chewing tobacco, and vaping devices, such as e-cigarettes. If you need help quitting, ask your health care provider. ?If you drink alcohol and your health care provider says that it is safe for you: ?Limit how much you have to: ?0-1 drink a day for women who are not pregnant. ?0-2 drinks a day for men. ?Know how much alcohol is in your drink. In the [place] one drink equals one 12 oz bottle of beer (355 mL), one 5 oz glass of wine (148 mL), or one 1? oz glass of hard liquor (44 mL). ?Learn to manage stress. If you need help with this, ask your health care provider. ?Take care of your body ? ?Keep your immunizations up to date. In addition to getting vaccinations as told by your health care provider, it is recommended that you get vaccinated against the following illnesses: ?The flu (influenza). Get a flu shot every year. ?Pneumonia. ?Hepatitis B. ?Schedule an eye exam soon after your diagnosis, and then one time every year after that. ?Check your skin and feet every day for cuts, bruises, redness, blisters, or sores. Schedule a foot exam with your health care provider once every year. ?Brush your teeth and gums two times a day, and floss one or more times a day. Visit your dentist one or more times every  6 months. ?Maintain a healthy weight. ?General instructions ?Share your diabetes management plan with people in your workplace, school, and household. ?Carry a medical alert card or wear medical alert jewelry. ?Keep all follow-up visits. This is important. ?Questions to ask your health care provider ?Should I meet with a certified diabetes care and education specialist? ?Where can I find a support group for people with diabetes? ?Where to find more information ?For help and guidance and for more information about diabetes, please visit: ?American Diabetes Association (ADA): www.diabetes.org ?American  Association of Diabetes Care and Education Specialists (ADCES): www.diabeteseducator.org ?International Diabetes Federation (IDF): MemberVerification.ca ?Summary ?Caring for yourself after you have been diagnosed with type 2 diabetes (type 2 diabetes mel

## 2021-10-16 LAB — CBC
HCT: 44.2 % (ref 39.0–52.0)
Hemoglobin: 15 g/dL (ref 13.0–17.0)
MCHC: 33.9 g/dL (ref 30.0–36.0)
MCV: 96.3 fl (ref 78.0–100.0)
Platelets: 110 10*3/uL — ABNORMAL LOW (ref 150.0–400.0)
RBC: 4.59 Mil/uL (ref 4.22–5.81)
RDW: 13.5 % (ref 11.5–15.5)
WBC: 5.7 10*3/uL (ref 4.0–10.5)

## 2021-10-16 LAB — COMPREHENSIVE METABOLIC PANEL
ALT: 76 U/L — ABNORMAL HIGH (ref 0–53)
AST: 91 U/L — ABNORMAL HIGH (ref 0–37)
Albumin: 4.4 g/dL (ref 3.5–5.2)
Alkaline Phosphatase: 134 U/L — ABNORMAL HIGH (ref 39–117)
BUN: 16 mg/dL (ref 6–23)
CO2: 21 mEq/L (ref 19–32)
Calcium: 9.9 mg/dL (ref 8.4–10.5)
Chloride: 101 mEq/L (ref 96–112)
Creatinine, Ser: 0.84 mg/dL (ref 0.40–1.50)
GFR: 93.98 mL/min (ref >60.00)
Glucose, Bld: 142 mg/dL — ABNORMAL HIGH (ref 70–99)
Potassium: 4.4 mEq/L (ref 3.5–5.1)
Sodium: 132 mEq/L — ABNORMAL LOW (ref 135–145)
Total Bilirubin: 1.6 mg/dL — ABNORMAL HIGH (ref 0.2–1.2)
Total Protein: 7.2 g/dL (ref 6.0–8.3)

## 2021-10-23 ENCOUNTER — Encounter: Payer: Self-pay | Admitting: Family Medicine

## 2021-11-20 ENCOUNTER — Other Ambulatory Visit: Payer: Self-pay | Admitting: Family Medicine

## 2021-11-20 DIAGNOSIS — I10 Essential (primary) hypertension: Secondary | ICD-10-CM

## 2021-11-20 NOTE — Telephone Encounter (Signed)
Clarify with patient.  He has refills of the metoprolol at home as well as his lisinopril 20 mg which is his current dose.  Both refills were refused. ?

## 2021-11-20 NOTE — Telephone Encounter (Signed)
I see that one of the Lisinopril was refused is this refill appropriate? ?

## 2021-12-10 ENCOUNTER — Telehealth: Payer: Self-pay

## 2021-12-10 NOTE — Telephone Encounter (Signed)
The labs have been faxed over for the patient to [facility].

## 2021-12-10 NOTE — Telephone Encounter (Addendum)
Patient called in stating that he is needing his most recent blood work sent to Rheumatology, fax number is [phone].  With attention to [place].

## 2021-12-16 ENCOUNTER — Other Ambulatory Visit (INDEPENDENT_AMBULATORY_CARE_PROVIDER_SITE_OTHER): Payer: BC Managed Care – PPO

## 2021-12-16 ENCOUNTER — Ambulatory Visit: Payer: BC Managed Care – PPO | Admitting: Gastroenterology

## 2021-12-16 ENCOUNTER — Encounter: Payer: Self-pay | Admitting: Gastroenterology

## 2021-12-16 DIAGNOSIS — R932 Abnormal findings on diagnostic imaging of liver and biliary tract: Secondary | ICD-10-CM | POA: Diagnosis not present

## 2021-12-16 DIAGNOSIS — R7989 Other specified abnormal findings of blood chemistry: Secondary | ICD-10-CM

## 2021-12-16 DIAGNOSIS — Z23 Encounter for immunization: Secondary | ICD-10-CM

## 2021-12-16 LAB — COMPREHENSIVE METABOLIC PANEL
ALT: 57 U/L — ABNORMAL HIGH (ref 0–53)
AST: 76 U/L — ABNORMAL HIGH (ref 0–37)
Albumin: 4 g/dL (ref 3.5–5.2)
Alkaline Phosphatase: 115 U/L (ref 39–117)
BUN: 13 mg/dL (ref 6–23)
CO2: 24 mEq/L (ref 19–32)
Calcium: 9.6 mg/dL (ref 8.4–10.5)
Chloride: 101 mEq/L (ref 96–112)
Creatinine, Ser: 0.83 mg/dL (ref 0.40–1.50)
GFR: 94.21 mL/min (ref >60.00)
Glucose, Bld: 252 mg/dL — ABNORMAL HIGH (ref 70–99)
Potassium: 4.2 mEq/L (ref 3.5–5.1)
Sodium: 133 mEq/L — ABNORMAL LOW (ref 135–145)
Total Bilirubin: 1.9 mg/dL — ABNORMAL HIGH (ref 0.2–1.2)
Total Protein: 7.1 g/dL (ref 6.0–8.3)

## 2021-12-16 LAB — IBC + FERRITIN
Ferritin: 390.8 ng/mL — ABNORMAL HIGH (ref 22.0–322.0)
Iron: 145 ug/dL (ref 42–165)
Saturation Ratios: 42.4 % (ref 20.0–50.0)
TIBC: 341.6 ug/dL (ref 250.0–450.0)
Transferrin: 244 mg/dL (ref 212.0–360.0)

## 2021-12-16 LAB — CBC
HCT: 41.6 % (ref 39.0–52.0)
Hemoglobin: 14.1 g/dL (ref 13.0–17.0)
MCHC: 33.8 g/dL (ref 30.0–36.0)
MCV: 95.8 fl (ref 78.0–100.0)
Platelets: 93 10*3/uL — ABNORMAL LOW (ref 150.0–400.0)
RBC: 4.34 Mil/uL (ref 4.22–5.81)
RDW: 13.9 % (ref 11.5–15.5)
WBC: 4 10*3/uL (ref 4.0–10.5)

## 2021-12-16 LAB — PROTIME-INR
INR: 1 ratio (ref 0.8–1.0)
Prothrombin Time: 11 s (ref 9.6–13.1)

## 2021-12-16 NOTE — Patient Instructions (Signed)
If you are age 62 or younger, your body mass index should be between 19-25. Your Body mass index is 33.87 kg/m. If this is out of the aformentioned range listed, please consider follow up with your Primary Care Provider.  _______________________________________________________  The [place] [facility] providers would like to encourage you to use [facility] to communicate with providers for non-urgent requests or questions.  Due to long hold times on the telephone, sending your provider a message by [facility] may be a faster and more efficient way to get a response.  Please allow 48 business hours for a response.  Please remember that this is for non-urgent requests.  _______________________________________________________  Your provider has requested that you go to the basement level for lab work before leaving today. Press "B" on the elevator. The lab is located at the first door on the left as you exit the elevator.  You have been scheduled for an abdominal ultrasound at [facility] Radiology (1st floor of hospital) on 12-24-21 at 8:00am. Please arrive 15 minutes prior to your appointment for registration. Make certain not to have anything to eat or drink after midnight the night prior to your appointment. Should you need to reschedule your appointment, please contact radiology at [phone]. This test typically takes about 30 minutes to perform.  Due to recent changes in healthcare laws, you may see the results of your imaging and laboratory studies on MyChart before your provider has had a chance to review them.  We understand that in some cases there may be results that are confusing or concerning to you. Not all laboratory results come back in the same time frame and the provider may be waiting for multiple results in order to interpret others.  Please give [place] 48 hours in order for your provider to thoroughly review all the results before contacting the office for clarification of your results.   Thank you for  entrusting me with your care and choosing [facility].  Dr Ardis Hughs

## 2021-12-16 NOTE — Progress Notes (Signed)
Review of pertinent gastrointestinal problems: 1.  Precancerous colon polyps. colonoscopy 11/2014 three subCM adenomas, Colonoscopy 2019 five subCM polyps (mixed TAs, SSPs).  Colonoscopy 03/2021 Dr. Ardis Velazquez 3 subcentimeter adenomas were removed.   HPI: This is a very pleasant 61 year old man who was referred to me by Nathaniel Agreste, MD  to evaluate elevated liver test, abnormal liver on CAT scan.     I last saw him September 2022 for personal history of colon polyps, colonoscopy.  See those results summarized above.  He is here today for a new problem  Blood work 09/2021;  liver tests were abnormal.  Total bilirubin was 1.6, alk phos was 134, AST was 91, ALT was 76: CBC was normal except for platelets around 110.  AST and ALT were elevated in 2020 as well  CT scan "renal stone study" 09/2021  radiologist wrote "Query nodular hepatic contour and enlarged caudate/left hepatic lobe. No focal liver abnormality. Contracted gallbladder. No gallstones, gallbladder wall thickening, or pericholecystic fluid. No biliary dilatation.   Blood work 09/2018 ordered by his rheumatologist at the time: Hepatitis C antibody negative, AST 52, ALT 98, hepatitis B core IgM nonreactive, hepatitis B surface antigen nonreactive, HIV negative,  Blood work 02/2016 AST was 42, ALT was 86  Blood work 11/2008 AST was 98, ALT was 211  Arrived via is associated with hepatotoxicity.  He recalls being told that his liver tests were abnormal even 25 years ago when he had a life insurance policy taken out.  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  He never drinks much more than that.  He does not consider himself an alcoholic.  He just likes having 2 or 3 drinks with dinner  He has never had jaundice, liver disease does not run in his family.  He lost about 30 pounds since the beginning of COVID, used to weigh around 300 pounds.  He has psoriatic arthritis and has been on Biologics for 2 or 3  years now.  Nathaniel Velazquez was added about a year and a half ago.  He does not have trouble with swelling in his legs or in his abdomen, no overt GI bleeding.   Review of systems: Pertinent positive and negative review of systems were noted in the above HPI section. All other review negative.   Past Medical History:  Diagnosis Date   Arthritis    Cancer ([place])    skin cancer   GERD (gastroesophageal reflux disease)    Hypertension    Obesity    Steroid injection in rt knee    Past Surgical History:  Procedure Laterality Date   ANAL FISTULECTOMY  1987   APPENDECTOMY     COLONOSCOPY     KNEE SURGERY Bilateral    meniscal tear repairs   POLYPECTOMY     ROTATOR CUFF REPAIR Bilateral 2018   TONSILLECTOMY      Current Outpatient Medications  Medication Instructions   blood glucose meter kit and supplies Dispense based on patient and insurance preference. Use up to four times daily as directed. (FOR ICD-10 E10.9, E11.9).once per day fasting or 2 hours after meals.   clobetasol ointment (TEMOVATE) 0.05 % As needed, As directed   ENBREL MINI 50 MG/ML SOCT INSERT MINI CARTRIDGE INTO AUTOINJECTOR AND INJECT UNDER THE SKIN EVERY 7 DAYS.   leflunomide (ARAVA) 10 MG tablet Take 1 tablet daily for 2 weeks then increase to 2 tablet daily.   lisinopril (ZESTRIL) 20 mg, Oral,  Daily   metoprolol succinate (TOPROL-XL) 25 mg, Oral, Daily   nystatin-triamcinolone ointment (MYCOLOG) APPLY SPARINGLY TO THE AFFECTED AREA FOR UP TO 7 DAYS   psyllium (METAMUCIL) 58.6 % powder 1 packet, Oral, 2 times daily   sildenafil (REVATIO) 20 MG tablet TAKE ONE TO THREE TABLETS BY MOUTH DAILY AS NEEDED   valACYclovir (VALTREX) 500 MG tablet Oral    Allergies as of 12/16/2021 - Review Complete 12/16/2021  Allergen Reaction Noted   Gabapentin Other (See Comments) 04/10/2014    Family History  Problem Relation Age of Onset   Hypertrophic cardiomyopathy Brother    Heart disease Mother    Hypertension Mother     Heart disease Father    Hypertension Father    Obesity Brother    Heart disease Brother    Healthy Daughter    Ankylosing spondylitis Son    Autoimmune disease Brother    Healthy Daughter    Colon cancer Neg Hx    Colon polyps Neg Hx    Esophageal cancer Neg Hx    Pancreatic cancer Neg Hx    Stomach cancer Neg Hx    Rectal cancer Neg Hx     Social History   Socioeconomic History   Marital status: Married    Spouse name: Not on file   Number of children: Not on file   Years of education: Not on file   Highest education level: Not on file  Occupational History   Not on file  Tobacco Use   Smoking status: Never   Smokeless tobacco: Never  Vaping Use   Vaping Use: Never used  Substance and Sexual Activity   Alcohol use: Yes    Comment: social   Drug use: No   Sexual activity: Never  Other Topics Concern   Not on file  Social History Narrative   Sales rep   Social Determinants of Health   Financial Resource Strain: Not on file  Food Insecurity: Not on file  Transportation Needs: Not on file  Physical Activity: Not on file  Stress: Not on file  Social Connections: Not on file  Intimate Partner Violence: Not on file     Physical Exam: BP 120/60   Pulse 64   Ht 6' 3"  (1.905 m)   Wt 271 lb (122.9 kg)   BMI 33.87 kg/m  Constitutional: generally well-appearing Psychiatric: alert and oriented x3 Eyes: extraocular movements intact Mouth: oral pharynx moist, no lesions Neck: supple no lymphadenopathy Cardiovascular: heart regular rate and rhythm Lungs: clear to auscultation bilaterally Abdomen: soft, nontender, nondistended, no obvious ascites, no peritoneal signs, normal bowel sounds Extremities: no lower extremity edema bilaterally Skin: no lesions on visible extremities   Assessment and plan: 61 y.o. male with elevated liver test, likely cirrhosis  He has a BMI of 34 and he has lost 30 pounds in the past 2 or 3 years and so previously his BMI was more  like 35 or 36.  He understands that it is very possible that he has Nathaniel Velazquez related liver disease.  He also drinks 3 cocktails daily and has for many years.  This is 6 or 8 ounces of bourbon on a daily basis and he understands that this might also be contributing to some of his liver problems.  I recommend he try to cut back significantly on his alcohol intake.  We are going to check him for other potential causes of his chronic liver disease with a battery of blood work including viral hepatitis test, autoimmune  tests etc.  See his AVS for further details.  He will also get right upper quadrant ultrasound to confirm cirrhosis.  The radiologist described "query cirrhosis" and I think I would like a bit more definitive imaging.  After we receive all of these test results we will make a plan.  He will likely need upper endoscopy to screen for varices and then he knows he will probably need office visit with me every 6 months or so with hepatoma screening, following his MELD score.  Nathaniel Velazquez is described as causing potential "hepatotoxicity" I am not sure if that is playing a role.  We will have to see how the rest of his testing goes.  Please see the "Patient Instructions" section for addition details about the plan.   Owens Loffler, MD [facility] 12/16/2021, 11:01 AM  Cc: Nathaniel Agreste, MD  Total time on date of encounter was 46  minutes (this included time spent preparing to see the patient reviewing records; obtaining and/or reviewing separately obtained history; performing a medically appropriate exam and/or evaluation; counseling and educating the patient and family if present; ordering medications, tests or procedures if applicable; and documenting clinical information in the health record).

## 2021-12-18 ENCOUNTER — Telehealth: Payer: Self-pay | Admitting: Gastroenterology

## 2021-12-18 NOTE — Telephone Encounter (Signed)
The pt has been advised that Dr Ardis Hughs has not reviewed the labs as of now.  We will call him as soon as available.  The pt has been advised of the information and verbalized understanding.

## 2021-12-21 LAB — IGA: Immunoglobulin A: <5 mg/dL — ABNORMAL LOW (ref 70–320)

## 2021-12-21 LAB — AFP TUMOR MARKER: AFP-Tumor Marker: 3.7 ng/mL (ref <6.1)

## 2021-12-21 LAB — HEPATITIS B SURFACE ANTIGEN: Hepatitis B Surface Ag: NONREACTIVE

## 2021-12-21 LAB — HEPATITIS B SURFACE ANTIBODY,QUALITATIVE: Hep B S Ab: NONREACTIVE

## 2021-12-21 LAB — CERULOPLASMIN: Ceruloplasmin: 32 mg/dL (ref 18–36)

## 2021-12-21 LAB — HEPATITIS A ANTIBODY, TOTAL: Hepatitis A AB,Total: REACTIVE — AB

## 2021-12-21 LAB — HEPATITIS C ANTIBODY
Hepatitis C Ab: NONREACTIVE
SIGNAL TO CUT-OFF: 0.38 (ref <1.00)

## 2021-12-21 LAB — ALPHA-1-ANTITRYPSIN: A-1 Antitrypsin, Ser: 163 mg/dL (ref 83–199)

## 2021-12-21 LAB — MITOCHONDRIAL ANTIBODIES: Mitochondrial M2 Ab, IgG: <=20 U (ref <=20.0)

## 2021-12-21 LAB — ANTI-SMOOTH MUSCLE ANTIBODY, IGG: Actin (Smooth Muscle) Antibody (IGG): <20 U (ref <20)

## 2021-12-21 LAB — ANA: Anti Nuclear Antibody (ANA): NEGATIVE

## 2021-12-21 LAB — TISSUE TRANSGLUTAMINASE, IGA: (tTG) Ab, IgA: <1 U/mL

## 2021-12-24 ENCOUNTER — Ambulatory Visit (HOSPITAL_COMMUNITY)
Admission: RE | Admit: 2021-12-24 | Discharge: 2021-12-24 | Disposition: A | Discharge status: Home / Self Care | Payer: BC Managed Care – PPO | Source: Ambulatory Visit | Attending: Gastroenterology | Admitting: Gastroenterology

## 2021-12-24 DIAGNOSIS — R932 Abnormal findings on diagnostic imaging of liver and biliary tract: Secondary | ICD-10-CM | POA: Insufficient documentation

## 2021-12-24 DIAGNOSIS — R7989 Other specified abnormal findings of blood chemistry: Secondary | ICD-10-CM | POA: Diagnosis present

## 2022-01-05 ENCOUNTER — Telehealth: Payer: Self-pay | Admitting: Gastroenterology

## 2022-01-05 NOTE — Progress Notes (Signed)
Ok to schedule nurse visit for Hep B vaccination, see GI notes. Non-immune.  Immunization History  Administered Date(s) Administered   Influenza, Quadrivalent, Recombinant, Inj, Pf 06/05/2018, 04/23/2019   Influenza,inj,Quad PF,6+ Mos 05/17/2013, 04/10/2014, 04/23/2015, 04/06/2017   Influenza,inj,quad, With Preservative 03/19/2017   Influenza-Unspecified 05/19/2016, 05/18/2018, 04/23/2019, 05/17/2021   PFIZER(Purple Top)SARS-COV-2 Vaccination 09/26/2019, 10/17/2019   Pneumococcal Polysaccharide-23 12/28/2018   Td 07/19/1998   Tdap 04/10/2014

## 2022-01-05 NOTE — Progress Notes (Signed)
Called pt to schedule nurse visit for Hep B vaccine . He states he is seeing DR Carlota Raspberry on June 29,2023 and he wants to do this then.

## 2022-01-05 NOTE — Telephone Encounter (Signed)
See alternate 6/19 note

## 2022-01-14 ENCOUNTER — Encounter: Payer: Self-pay | Admitting: Family Medicine

## 2022-01-14 ENCOUNTER — Ambulatory Visit: Payer: BC Managed Care – PPO | Admitting: Family Medicine

## 2022-01-14 ENCOUNTER — Other Ambulatory Visit: Payer: Self-pay

## 2022-01-14 VITALS — BP 130/66 | HR 65 | Temp 98.3°F | Resp 18 | Ht 75.0 in | Wt 260.2 lb

## 2022-01-14 DIAGNOSIS — Z23 Encounter for immunization: Secondary | ICD-10-CM | POA: Diagnosis not present

## 2022-01-14 DIAGNOSIS — I10 Essential (primary) hypertension: Secondary | ICD-10-CM

## 2022-01-14 DIAGNOSIS — K7460 Unspecified cirrhosis of liver: Secondary | ICD-10-CM

## 2022-01-14 DIAGNOSIS — E119 Type 2 diabetes mellitus without complications: Secondary | ICD-10-CM

## 2022-01-14 LAB — POCT GLYCOSYLATED HEMOGLOBIN (HGB A1C): Hemoglobin A1C: 6.1 % — AB (ref 4.0–5.6)

## 2022-01-14 MED ORDER — LISINOPRIL 20 MG PO TABS: 20.0000 mg | ORAL_TABLET | Freq: Every day | ORAL | 1 refills | Status: DC

## 2022-01-14 MED ORDER — METOPROLOL SUCCINATE ER 25 MG PO TB24: 25.0000 mg | ORAL_TABLET | Freq: Every day | ORAL | 1 refills | Status: DC

## 2022-01-14 NOTE — Progress Notes (Signed)
Subjective:  Patient ID: Nathaniel Velazquez, male    DOB: 06-01-1961  Age: 61 y.o. MRN: [id number]  CC:  Chief Complaint  Patient presents with   Follow-up    Patient states he is here for follow up on labs and diabetes.    HPI Nathaniel Velazquez presents for   Diabetes: Elevated A1c of 7.5 at March visit.  Plan for diet control. He has lost 11 pounds. Portion control, avoiding simple carbs, cutting back on pasta, breads. Peak weight 299.  Home readings:  Fasting: 159, 177,132 2 hour postprandial: 111, 161, 151, 146.  No lows.  No blurry vision, n/v/or abd pain. No new urinary sx's.  On new meds from urology. Still nocturia.  Enbrel working for joint pains.  Plan for EGD with GI: Cut back on alcohol. Met with GI. Cirrhosis noted on ultrasound, no focal hepatic mass,  Plan for EGD for eval of varices in next week or two. MELD score of 6.  Nonimmune to hep B.  Immunization History  Administered Date(s) Administered   Hepb-cpg 01/14/2022   Influenza, Quadrivalent, Recombinant, Inj, Pf 06/05/2018, 04/23/2019   Influenza,inj,Quad PF,6+ Mos 05/17/2013, 04/10/2014, 04/23/2015, 04/06/2017   Influenza,inj,quad, With Preservative 03/19/2017   Influenza-Unspecified 05/19/2016, 05/18/2018, 04/23/2019, 05/17/2021   PFIZER(Purple Top)SARS-COV-2 Vaccination 09/26/2019, 10/17/2019   Pneumococcal Polysaccharide-23 12/28/2018   Td 07/19/1998   Tdap 04/10/2014  Last covid booster 03/2021.    Wt Readings from Last 3 Encounters:  01/14/22 260 lb 3.2 oz (118 kg)  12/16/21 271 lb (122.9 kg)  10/15/21 274 lb (124.3 kg)    Lab Results  Component Value Date   HGBA1C 6.1 (A) 01/14/2022   HGBA1C 7.5 (A) 10/15/2021   HGBA1C 5.9 (H) 06/18/2020   Lab Results  Component Value Date   LDLCALC 68 11/30/2019   CREATININE 0.83 12/16/2021    Hypertension: Lisinopril 68m, toprol 220mqd.  Home readings: 120-130/80.  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   BMI 38.0-38.9,adult 09/19/2018   Family history of Wegener's granulomatosis 09/19/2018   Moderate alcohol consumption 09/19/2018   Psoriasis 09/19/2018   Psoriatic arthritis ([place]/09/2018   Rectal pain 06/05/2015   Hypertension    Obesity    Hyperlipidemia 11/21/2008   PALPITATIONS 10/30/2008   Past Medical History:  Diagnosis Date   Arthritis    [place]   skin cancer   GERD (gastroesophageal reflux disease)    Hypertension    Obesity    Steroid injection in rt knee   Past Surgical History:  Procedure Laterality Date   ANAL FISTULECTOMY  1987   APPENDECTOMY     COLONOSCOPY     KNEE SURGERY Bilateral    meniscal tear repairs   POLYPECTOMY     ROTATOR CUFF REPAIR Bilateral 2018   TONSILLECTOMY     Allergies  Allergen Reactions   Gabapentin Other (See Comments)    Made very jittery   Prior to Admission  medications   Medication Sig Start Date End Date Taking? Authorizing Provider  blood glucose meter kit and supplies Dispense based on patient and insurance preference. Use up to four times daily as directed. (FOR ICD-10 E10.9, E11.9).once per day fasting or 2 hours after meals. 10/15/21  Yes [name], MD  clobetasol ointment (TEMOVATE) 0.05 % as needed. As directed 05/15/12  Yes [provider]  ENBREL MINI 50 MG/ML SOCT INSERT MINI CARTRIDGE INTO AUTOINJECTOR AND INJECT UNDER THE SKIN EVERY 7 DAYS. 06/05/20  Yes Ofilia Neas, PA-C  leflunomide (ARAVA) 10 MG tablet Take 1 tablet daily for 2 weeks then  increase to 2 tablet daily. Patient taking differently: Take 10 mg by mouth daily. Take 1 tablet daily 01/14/20  Yes Yopp, Amber C, RPH-CPP  lisinopril (ZESTRIL) 20 MG tablet Take 1 tablet (20 mg total) by mouth daily. 05/28/21  Yes [name], MD  metoprolol succinate (TOPROL-XL) 25 MG 24 hr tablet Take 1 tablet (25 mg total) by mouth daily. 05/28/21  Yes [name], MD  nystatin-triamcinolone ointment (MYCOLOG) APPLY SPARINGLY TO THE AFFECTED AREA FOR UP TO 7 DAYS 06/23/18  Yes [name], MD  psyllium (METAMUCIL) 58.6 % powder Take 1 packet by mouth 2 (two) times daily.    Yes [provider]  sildenafil (REVATIO) 20 MG tablet TAKE ONE TO THREE TABLETS BY MOUTH DAILY AS NEEDED 07/22/21  Yes [name], MD  valACYclovir (VALTREX) 500 MG tablet Take by mouth. 02/23/21  Yes [provider]  finasteride (PROSCAR) 5 MG tablet Take 5 mg by mouth daily. 11/19/21   [provider]  tamsulosin (FLOMAX) 0.4 MG CAPS capsule Take 0.4 mg by mouth daily. 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  Eyes:  Negative for visual disturbance.  Respiratory:  Negative for cough, chest tightness and shortness of breath.   Cardiovascular:  Negative for chest pain, palpitations and leg swelling.  Gastrointestinal:  Negative for abdominal pain and blood in stool.  Neurological:  Negative for dizziness, light-headedness and headaches.   Per HPI   Objective:   Vitals:   01/14/22 1529  BP: 130/66  Pulse: 65  Resp: 18  Temp: 98.3 F (36.8 C)  TempSrc: Temporal  SpO2: 96%  Weight: 260 lb 3.2 oz (118 kg)  Height: _0  (1.905 m)     Physical Exam Vitals reviewed.  Constitutional:  Appearance: He is well-developed.  HENT:     Head: Normocephalic and atraumatic.  Neck:     Vascular: No carotid bruit or JVD.  Cardiovascular:     Rate and Rhythm: Normal rate and regular rhythm.     Heart sounds: Normal heart sounds. No murmur heard. Pulmonary:     Effort: Pulmonary effort is normal.     Breath sounds: Normal breath sounds. No rales.  Abdominal:     General: There is no distension.     Tenderness: There is no abdominal tenderness.  Musculoskeletal:     Right lower leg: No edema.     Left lower leg: No edema.  Skin:    General: Skin is warm and dry.  Neurological:     Mental Status: He is alert and oriented to person, place, and time.  Psychiatric:        Mood and Affect: Mood normal.      Results for orders placed or performed in visit on 01/14/22  POCT HgB A1C  Result Value Ref Range   Hemoglobin A1C 6.1 (A) 4.0 - 5.6 %   HbA1c POC (<> result, manual entry)     HbA1c, POC (prediabetic range)     HbA1c, POC (controlled diabetic range)       Assessment & Plan:  Nathaniel Velazquez is a 61 y.o. male . New onset type 2 diabetes mellitus ([place]) - Plan: POCT HgB A1C  -Commended on impressive improvement in A1c with his weight loss and diet changes.  Continue same.  Recheck levels in 6 months.  Hepatic cirrhosis, unspecified hepatic cirrhosis type,  unspecified whether ascites present ([place]) Need for hepatitis B vaccination - Plan: Heplisav-B (HepB-CPG) Vaccine  -Continue follow-up as planned with gastroenterology including for EGD.  Low MELD score.  Hep B vaccine given with repeat in 1 month in 6 months.  Essential hypertension - Plan: lisinopril (ZESTRIL) 20 MG tablet, metoprolol succinate (TOPROL-XL) 25 MG 24 hr tablet  -Stable with current regimen, continue lisinopril, metoprolol same doses.  Recent labs noted.  Can check fasting lipids, other labs at his 54-monthfollow-up.  Meds ordered this encounter  Medications   lisinopril (ZESTRIL) 20 MG tablet    Sig: Take 1 tablet (20 mg total) by mouth daily.    Dispense:  90 tablet    Refill:  1   metoprolol succinate (TOPROL-XL) 25 MG 24 hr tablet    Sig: Take 1 tablet (25 mg total) by mouth daily.    Dispense:  90 tablet    Refill:  1   Patient Instructions  Hep B vaccine today - repeat in 1 and 6 months  Keep follow up with gastroenterology.  Repeat covid booster at your pharmacy.  Blood sugar is MUCH better. Keep up the good work!     Signed,   JMerri Ray MD [facility] 01/14/22 5:53 PM

## 2022-01-14 NOTE — Patient Instructions (Addendum)
Hep B vaccine today - repeat in 1 and 6 months  Keep follow up with gastroenterology.  Repeat covid booster at your pharmacy.  Blood sugar is MUCH better. Keep up the good work!

## 2022-02-11 ENCOUNTER — Ambulatory Visit (INDEPENDENT_AMBULATORY_CARE_PROVIDER_SITE_OTHER): Payer: BC Managed Care – PPO | Admitting: Family Medicine

## 2022-02-11 DIAGNOSIS — Z23 Encounter for immunization: Secondary | ICD-10-CM | POA: Diagnosis not present

## 2022-02-11 NOTE — Progress Notes (Signed)
Pt received HEP B vaccine tolerated well

## 2022-03-01 ENCOUNTER — Ambulatory Visit (AMBULATORY_SURGERY_CENTER): Payer: BC Managed Care – PPO

## 2022-03-01 ENCOUNTER — Encounter: Payer: Self-pay | Admitting: Internal Medicine

## 2022-03-01 VITALS — Ht 75.0 in | Wt 265.0 lb

## 2022-03-01 DIAGNOSIS — R7989 Other specified abnormal findings of blood chemistry: Secondary | ICD-10-CM

## 2022-03-01 DIAGNOSIS — R932 Abnormal findings on diagnostic imaging of liver and biliary tract: Secondary | ICD-10-CM

## 2022-03-01 DIAGNOSIS — R7303 Prediabetes: Secondary | ICD-10-CM

## 2022-03-01 HISTORY — DX: Prediabetes: R73.03

## 2022-03-01 NOTE — Progress Notes (Signed)
No egg or soy allergy known to patient   No issues known to pt with past sedation with any surgeries or procedures  Patient denies ever being told they had issues or difficulty with intubation   No FH of Malignant Hyperthermia  Pt is not on diet pills  Pt is not on  home 02   Pt is not on blood thinners   No A fib or A flutter  Have any cardiac testing pending--no  Dr Ardis Hughs pt, changed to Dr Vena Rua sched

## 2022-03-08 ENCOUNTER — Ambulatory Visit (AMBULATORY_SURGERY_CENTER): Payer: BC Managed Care – PPO | Admitting: Internal Medicine

## 2022-03-08 ENCOUNTER — Encounter: Payer: BC Managed Care – PPO | Admitting: Gastroenterology

## 2022-03-08 ENCOUNTER — Encounter: Payer: Self-pay | Admitting: Internal Medicine

## 2022-03-08 VITALS — BP 133/69 | HR 64 | Temp 99.3°F | Resp 12 | Ht 75.0 in | Wt 265.0 lb

## 2022-03-08 DIAGNOSIS — K2960 Other gastritis without bleeding: Secondary | ICD-10-CM | POA: Diagnosis not present

## 2022-03-08 DIAGNOSIS — K7460 Unspecified cirrhosis of liver: Secondary | ICD-10-CM

## 2022-03-08 DIAGNOSIS — R932 Abnormal findings on diagnostic imaging of liver and biliary tract: Secondary | ICD-10-CM

## 2022-03-08 DIAGNOSIS — R7989 Other specified abnormal findings of blood chemistry: Secondary | ICD-10-CM

## 2022-03-08 DIAGNOSIS — K259 Gastric ulcer, unspecified as acute or chronic, without hemorrhage or perforation: Secondary | ICD-10-CM | POA: Diagnosis not present

## 2022-03-08 DIAGNOSIS — K2950 Unspecified chronic gastritis without bleeding: Secondary | ICD-10-CM | POA: Diagnosis not present

## 2022-03-08 MED ORDER — SODIUM CHLORIDE 0.9 % IV SOLN: 500.0000 mL | INTRAVENOUS | Status: AC

## 2022-03-08 MED ORDER — PANTOPRAZOLE SODIUM 40 MG PO TBEC: 40.0000 mg | DELAYED_RELEASE_TABLET | Freq: Every day | ORAL | 3 refills | Status: DC

## 2022-03-08 NOTE — Op Note (Signed)
[place] Patient Name: Nathaniel Velazquez Procedure Date: 03/08/2022 10:09 AM MRN: [id number] Endoscopist: Jerene Bears , MD Age: 61 Referring MD:  Date of Birth: 1960-09-18 Gender: Male Account #: [id number] Procedure:                Upper GI endoscopy Indications:              Cirrhosis rule out esophageal varices Medicines:                Monitored Anesthesia Care Procedure:                Pre-Anesthesia Assessment:                           - Prior to the procedure, a History and Physical                            was performed, and patient medications and                            allergies were reviewed. The patient's tolerance of                            previous anesthesia was also reviewed. The risks                            and benefits of the procedure and the sedation                            options and risks were discussed with the patient.                            All questions were answered, and informed consent                            was obtained. Prior Anticoagulants: The patient has                            taken no previous anticoagulant or antiplatelet                            agents. ASA Grade Assessment: II - A patient with                            mild systemic disease. After reviewing the risks                            and benefits, the patient was deemed in                            satisfactory condition to undergo the procedure.                           After obtaining informed consent, the endoscope was  passed under direct vision. Throughout the                            procedure, the patient's blood pressure, pulse, and                            oxygen saturations were monitored continuously. The                            GIF [id number] #[id number] was introduced through the                            mouth, and advanced to the second part of duodenum.                            The upper GI  endoscopy was accomplished without                            difficulty. The patient tolerated the procedure                            well. Scope In: Scope Out: Findings:                 The examined esophagus was normal.                           Five non-bleeding cratered and superficial gastric                            ulcers were found in the gastric antrum. The                            largest lesion was 7 mm in largest dimension.                            Biopsies were taken with a cold forceps for                            histology and Helicobacter pylori testing (Jar 1 =                            antrum and ulcerations).                           Diffuse moderate inflammation characterized by                            erythema was found in the gastric body. Biopsies                            were taken with a cold forceps for histology and                            Helicobacter pylori testing (Jar  2 = gastric body).                           The examined duodenum was normal. Complications:            No immediate complications. Estimated Blood Loss:     Estimated blood loss was minimal. Impression:               - Normal esophagus. No varices.                           - Non-bleeding gastric ulcers. Biopsied.                           - Gastritis. Biopsied.                           - Normal examined duodenum. Recommendation:           - Patient has a contact number available for                            emergencies. The signs and symptoms of potential                            delayed complications were discussed with the                            patient. Return to normal activities tomorrow.                            Written discharge instructions were provided to the                            patient.                           - Resume previous diet.                           - Continue present medications.                           - Begin pantoprazole  40 mg once daily for                            gastritis/ulcers.                           - Avoid ibuprofen, naproxen, or other non-steroidal                            anti-inflammatory drugs.                           - Await pathology results.                           - Repeat upper endoscopy in 2  years for screening                            purposes.                           - Return to GI clinic in 3-4 months with Dr.                            Ardis Hughs, myself or APP for follow-up of cirrhosis. Jerene Bears, MD 03/08/2022 10:26:59 AM This report has been signed electronically.

## 2022-03-08 NOTE — Progress Notes (Signed)
Report to PACU, RN, vss, BBS= Clear.  

## 2022-03-08 NOTE — Patient Instructions (Signed)
BEGIN PANTOPRAZOLE 40 MG ONCE DAILY FOR GASTRITIS/ULCERS.   AVOID IBUPROFEN, NAPROXEN AND OTHER NON-STEROIDAL ANTI-INFLAMMATORY DRUGS.  RETURN TO GI CLINIC IN 3-4 MONTHS WITH DR JACOBS, MYSELF OR APP FOR FOLLOW-UP OF CIRRHOSIS.    YOU HAD AN ENDOSCOPIC PROCEDURE TODAY AT [facility]:   Refer to the procedure report that was given to you for any specific questions about what was found during the examination.  If the procedure report does not answer your questions, please call your gastroenterologist to clarify.  If you requested that your care partner not be given the details of your procedure findings, then the procedure report has been included in a sealed envelope for you to review at your convenience later.  YOU SHOULD EXPECT: Some feelings of bloating in the abdomen. Passage of more gas than usual.  Walking can help get rid of the air that was put into your GI tract during the procedure and reduce the bloating. If you had a lower endoscopy (such as a colonoscopy or flexible sigmoidoscopy) you may notice spotting of blood in your stool or on the toilet paper. If you underwent a bowel prep for your procedure, you may not have a normal bowel movement for a few days.  Please Note:  You might notice some irritation and congestion in your nose or some drainage.  This is from the oxygen used during your procedure.  There is no need for concern and it should clear up in a day or so.  SYMPTOMS TO REPORT IMMEDIATELY:  Following upper endoscopy (EGD)  Vomiting of blood or coffee ground material  New chest pain or pain under the shoulder blades  Painful or persistently difficult swallowing  New shortness of breath  Fever of 100F or higher  Black, tarry-looking stools  For urgent or emergent issues, a gastroenterologist can be reached at any hour by calling [phone]. Do not use MyChart messaging for urgent concerns.    DIET:  We do recommend a small meal at first, but then  you may proceed to your regular diet.  Drink plenty of fluids but you should avoid alcoholic beverages for 24 hours.  ACTIVITY:  You should plan to take it easy for the rest of today and you should NOT DRIVE or use heavy machinery until tomorrow (because of the sedation medicines used during the test).    FOLLOW UP: Our staff will call the number listed on your records the next business day following your procedure.  We will call around 7:15- 8:00 am to check on you and address any questions or concerns that you may have regarding the information given to you following your procedure. If we do not reach you, we will leave a message.  If you develop any symptoms (ie: fever, flu-like symptoms, shortness of breath, cough etc.) before then, please call [phone].  If you test positive for Covid 19 in the 2 weeks post procedure, please call and report this information to [place].    If any biopsies were taken you will be contacted by phone or by letter within the next 1-3 weeks.  Please call us at [phone] if you have not heard about the biopsies in 3 weeks.    SIGNATURES/CONFIDENTIALITY: You and/or your care partner have signed paperwork which will be entered into your electronic medical record.  These signatures attest to the fact that that the information above on your After Visit Summary has been reviewed and is understood.  Full responsibility of the confidentiality  of this discharge information lies with you and/or your care-partner.  

## 2022-03-08 NOTE — Progress Notes (Signed)
GASTROENTEROLOGY PROCEDURE H&P NOTE   Primary Care Physician: [name], MD    Reason for Procedure:  Liver cirrhosis, variceal screening  Plan:    EGD  Patient is appropriate for endoscopic procedure(s) in the ambulatory ([place]) setting.  The nature of the procedure, as well as the risks, benefits, and alternatives were carefully and thoroughly reviewed with the patient. Ample time for discussion and questions allowed. The patient understood, was satisfied, and agreed to proceed.     HPI: Nathaniel Velazquez is a 61 y.o. male who presents for EGD for variceal screening.  Medical history as below.  No recent chest pain or shortness of breath.  No abdominal pain today.  Past Medical History:  Diagnosis Date   Arthritis    Cancer ([place])    skin cancer   GERD (gastroesophageal reflux disease)    Hypertension    Obesity    Steroid injection in rt knee   Prediabetes 03/01/2022   hgA1c was 6.1, checks cbg at home, no meds at this time    Past Surgical History:  Procedure Laterality Date   ANAL FISTULECTOMY  1987   APPENDECTOMY     COLONOSCOPY  04/08/2021   KNEE SURGERY Bilateral    meniscal tear repairs   POLYPECTOMY     ROTATOR CUFF REPAIR Bilateral 2018   TONSILLECTOMY     UPPER GASTROINTESTINAL ENDOSCOPY      Prior to Admission medications   Medication Sig Start Date End Date Taking? Authorizing Provider  ENBREL MINI 50 MG/ML SOCT INSERT MINI CARTRIDGE INTO AUTOINJECTOR AND INJECT UNDER THE SKIN EVERY 7 DAYS. 06/05/20  Yes Ofilia Neas, PA-C  finasteride (PROSCAR) 5 MG tablet Take 5 mg by mouth daily. 11/19/21  Yes [provider]  lisinopril (ZESTRIL) 20 MG tablet Take 1 tablet (20 mg total) by mouth daily. 01/14/22  Yes [name], MD  metoprolol succinate (TOPROL-XL) 25 MG 24 hr tablet Take 1 tablet (25 mg total) by mouth daily. 01/14/22  Yes [name], MD  psyllium (METAMUCIL) 58.6 % powder Take 1 packet by mouth 2 (two) times daily.     Yes [provider]  tamsulosin (FLOMAX) 0.4 MG CAPS capsule Take 0.4 mg by mouth daily. 11/19/21  Yes [provider]  ACCU-CHEK GUIDE test strip  10/19/21   [provider]  Accu-Chek Softclix Lancets lancets SMARTSIG:2 Topical Twice Daily 10/15/21   [provider]  blood glucose meter kit and supplies Dispense based on patient and insurance preference. Use up to four times daily as directed. (FOR ICD-10 E10.9, E11.9).once per day fasting or 2 hours after meals. 10/15/21   [name], MD  clobetasol ointment (TEMOVATE) 0.05 % as needed. As directed 05/15/12   [provider]  leflunomide (ARAVA) 10 MG tablet Take 1 tablet daily for 2 weeks then increase to 2 tablet daily. Patient taking differently: Take 10 mg by mouth daily. Take 1 tablet daily 01/14/20   Yopp, [employer] C, RPH-CPP  nystatin-triamcinolone ointment (MYCOLOG) APPLY SPARINGLY TO THE AFFECTED AREA FOR UP TO 7 DAYS 06/23/18   [name], MD  sildenafil (REVATIO) 20 MG tablet TAKE ONE TO THREE TABLETS BY MOUTH DAILY AS NEEDED 07/22/21   [name], MD  valACYclovir (VALTREX) 500 MG tablet Take by mouth as needed. 02/23/21   [provider]    Current Outpatient Medications  Medication Sig Dispense Refill   ENBREL MINI 50 MG/ML SOCT INSERT MINI CARTRIDGE INTO AUTOINJECTOR  AND INJECT UNDER THE SKIN EVERY 7 DAYS. 4 mL 0   finasteride (PROSCAR) 5 MG tablet Take 5 mg by mouth daily.     lisinopril (ZESTRIL) 20 MG tablet Take 1 tablet (20 mg total) by mouth daily. 90 tablet 1   metoprolol succinate (TOPROL-XL) 25 MG 24 hr tablet Take 1 tablet (25 mg total) by mouth daily. 90 tablet 1   psyllium (METAMUCIL) 58.6 % powder Take 1 packet by mouth 2 (two) times daily.      tamsulosin (FLOMAX) 0.4 MG CAPS capsule Take 0.4 mg by mouth daily.     ACCU-CHEK GUIDE test strip      Accu-Chek Softclix Lancets lancets SMARTSIG:2 Topical Twice Daily     blood glucose meter kit and supplies  Dispense based on patient and insurance preference. Use up to four times daily as directed. (FOR ICD-10 E10.9, E11.9).once per day fasting or 2 hours after meals. 1 each 0   clobetasol ointment (TEMOVATE) 0.05 % as needed. As directed     leflunomide (ARAVA) 10 MG tablet Take 1 tablet daily for 2 weeks then increase to 2 tablet daily. (Patient taking differently: Take 10 mg by mouth daily. Take 1 tablet daily) 60 tablet 2   nystatin-triamcinolone ointment (MYCOLOG) APPLY SPARINGLY TO THE AFFECTED AREA FOR UP TO 7 DAYS 30 g 0   sildenafil (REVATIO) 20 MG tablet TAKE ONE TO THREE TABLETS BY MOUTH DAILY AS NEEDED 10 tablet 3   valACYclovir (VALTREX) 500 MG tablet Take by mouth as needed.     Current Facility-Administered Medications  Medication Dose Route Frequency Provider Last Rate Last Admin   0.9 %  sodium chloride infusion  500 mL Intravenous Once Milus Banister, MD       0.9 %  sodium chloride infusion  500 mL Intravenous Continuous , Lajuan Lines, MD        Allergies as of 03/08/2022   (No Known Allergies)    Family History  Problem Relation Age of Onset   Hypertrophic cardiomyopathy Brother    Heart disease Mother    Hypertension Mother    Heart disease Father    Hypertension Father    Obesity Brother    Heart disease Brother    Healthy Daughter    Ankylosing spondylitis Son    Autoimmune disease Brother    Healthy Daughter    Colon cancer Neg Hx    Colon polyps Neg Hx    Esophageal cancer Neg Hx    Pancreatic cancer Neg Hx    Stomach cancer Neg Hx    Rectal cancer Neg Hx     Social History   Socioeconomic History   Marital status: Married    Spouse name: Not on file   Number of children: Not on file   Years of education: Not on file   Highest education level: Not on file  Occupational History   Not on file  Tobacco Use   Smoking status: Never   Smokeless tobacco: Never  Vaping Use   Vaping Use: Never used  Substance and Sexual Activity   Alcohol use: Yes     Alcohol/week: 10.0 standard drinks of alcohol    Types: 10 Standard drinks or equivalent per week    Comment: 2 cocktail 5 days every week   Drug use: Never   Sexual activity: Yes  Other Topics Concern   Not on file  Social History Narrative   Sales rep   Social Determinants of Health   Financial  Resource Strain: Not on file  Food Insecurity: Not on file  Transportation Needs: Not on file  Physical Activity: Not on file  Stress: Not on file  Social Connections: Not on file  Intimate Partner Violence: Not on file    Physical Exam: Vital signs in last 24 hours: _0  134/70   Pulse 73   Temp 99.3 F (37.4 C) (Temporal)   Ht 6' 3" (1.905 m)   Wt 265 lb (120.2 kg)   SpO2 97%   BMI 33.12 kg/m  GEN: NAD EYE: Sclerae anicteric ENT: MMM CV: Non-tachycardic Pulm: CTA b/l GI: Soft, NT/ND NEURO:  Alert & Oriented x 3   Zenovia Jarred, MD [facility]  03/08/2022 10:04 AM

## 2022-03-08 NOTE — Progress Notes (Signed)
Pt's states no medical or surgical changes since previsit or office visit. 

## 2022-03-09 ENCOUNTER — Telehealth: Payer: Self-pay

## 2022-03-09 NOTE — Telephone Encounter (Signed)
Attempted f/u call. No answer, left VM. 

## 2022-03-16 ENCOUNTER — Ambulatory Visit: Payer: BC Managed Care – PPO | Admitting: Gastroenterology

## 2022-03-17 ENCOUNTER — Encounter: Payer: Self-pay | Admitting: Internal Medicine

## 2022-05-22 ENCOUNTER — Other Ambulatory Visit: Payer: Self-pay | Admitting: Family Medicine

## 2022-05-22 DIAGNOSIS — N529 Male erectile dysfunction, unspecified: Secondary | ICD-10-CM

## 2022-07-13 ENCOUNTER — Encounter: Payer: Self-pay | Admitting: Family Medicine

## 2022-07-13 NOTE — Telephone Encounter (Signed)
See message to patient, has appointment tomorrow.

## 2022-07-14 ENCOUNTER — Encounter: Payer: Self-pay | Admitting: Family Medicine

## 2022-07-14 ENCOUNTER — Telehealth (INDEPENDENT_AMBULATORY_CARE_PROVIDER_SITE_OTHER): Payer: BC Managed Care – PPO | Admitting: Family Medicine

## 2022-07-14 VITALS — BP 141/74 | Temp 100.0°F | Ht 75.0 in | Wt 265.0 lb

## 2022-07-14 DIAGNOSIS — U071 COVID-19: Secondary | ICD-10-CM

## 2022-07-14 DIAGNOSIS — R051 Acute cough: Secondary | ICD-10-CM

## 2022-07-14 DIAGNOSIS — R030 Elevated blood-pressure reading, without diagnosis of hypertension: Secondary | ICD-10-CM

## 2022-07-14 MED ORDER — NIRMATRELVIR/RITONAVIR (PAXLOVID)TABLET: 3.0000 | ORAL_TABLET | Freq: Two times a day (BID) | ORAL | 0 refills | Status: AC

## 2022-07-14 MED ORDER — BENZONATATE 100 MG PO CAPS: 100.0000 mg | ORAL_CAPSULE | Freq: Three times a day (TID) | ORAL | 0 refills | Status: DC | PRN

## 2022-07-14 MED ORDER — GUAIFENESIN-CODEINE 100-10 MG/5ML PO SOLN: 5.0000 mL | Freq: Every evening | ORAL | 0 refills | Status: DC | PRN

## 2022-07-14 NOTE — Patient Instructions (Addendum)
Ok to stay on '50mg'$  toprol, but decrease back to '25mg'$  once blood pressures come down.  Mucinex or Mucinex DM for cough. I also sent in tessalon perles if needed or codeien cough syrup at night. Saline nasal spray or netipot for nasal congestion.  Continue to drink plenty of fluids, rest.  Return to the clinic or go to the nearest emergency room if any of your symptoms worsen or new symptoms occur.  Paxlovid sent to your pharmacy, avoid sildenafil for 1 week.  Watch blood pressures, and if they do decrease go back to lower dose of Toprol.  Even on low-dose of Toprol watch blood pressures as tamsulosin and Paxlovid may cause blood pressures to decrease further.  Your employer may have specific requirements for return to work, but this information is provided from the CDC. There is a link provided below for more information if needed.   Everyone who has presumed or confirmed COVID-19 should stay home and isolate from other people for at least 5 full days (day 0 is the first day of symptoms or the date of the day of the positive viral test for asymptomatic persons). You can end isolation after 5 full days if you are fever-free for 24 hours without the use of fever-reducing medication and your other symptoms have improved (Loss of taste and smell may persist for weeks or months after recovery and need not delay the end of isolation). You should continue to wear a well-fitting mask around others at home and in public for 5 additional days (day 6 through day 10) after the end of your 5-day isolation period. If you are unable to wear a mask when around others, you should continue to isolate for a full 10 days. Avoid people who have weakened immune systems or are more likely to get very sick from COVID-19, and nursing homes and other high-risk settings, until after at least 10 days.  If you continue to have fever or your other symptoms have not improved after 5 days of isolation, you should wait to end your  isolation until you are fever-free for 24 hours without the use of fever-reducing medication and your other symptoms have improved. Continue to wear a well-fitting mask through day 10.   https://brown.org/.html

## 2022-07-14 NOTE — Progress Notes (Signed)
Virtual Visit via Video Note  I connected with Nathaniel Velazquez on 07/14/22 at 12:22 PM by a video enabled telemedicine application and verified that I am speaking with the correct person using two identifiers.  Patient location: home by self.  My location: office - [place].    I discussed the limitations, risks, security and privacy concerns of performing an evaluation and management service by telephone and the availability of in person appointments. I also discussed with the patient that there may be a patient responsible charge related to this service. The patient expressed understanding and agreed to proceed, consent obtained  Chief complaint:  Chief Complaint  Patient presents with   Covid Positive    Pt tested positive for Covid Dec 26, pt bp has been 180/80, 170/80, last night it was 135/70 this morning it is 141/74, cough, congestion, body aches, fever chills,  Pt wants to know if he could take mucinex, pt can't sleep because of the cough    History of Present Illness: Nathaniel Velazquez is a 61 y.o. male  COVID-19 infection: Initial symptoms started 12/25. Felt anxious, elevated blood pressure and faster heartrate - going strong - BP 180.  Cough, congestion, body aches, fever, chills later that morning. HA yesterday Wife tested positive 12/25.  His positive test was 12/26. HA has improved since yesterday and advil. Avoids tylenol as on Enbrel for psoriatic arthritis.  Cough and congestion still there.  More nasal congestion yesterday.  No dyspnea/chest pain/confusion. Drinking fluids ok.  Cough keeping awake at night.  Discussed with rheum - off Enbrel until over infection - off past week.   See recent MyChart message regarding elevated blood pressures with recent COVID infection.  Option of additional dose of metoprolol.  Blood pressures up to 180/80, 170/80, improved last night at 135/70, 141/74 today on 2 toprol.  Has not had most recent covid booster. Last  infection over a year ago.   Discussed antiviral - interested. No statin. 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   Dysfunction of right eustachian tube 11/18/2015   Anal fissure 07/22/2015   Hypertensive disorder 07/03/2015   Obesity    Hyperlipidemia 11/21/2008   PALPITATIONS 10/30/2008   Past Medical History:  Diagnosis Date   Arthritis    Cancer ([place])    skin cancer   GERD (gastroesophageal reflux disease)    Hypertension    Obesity    Steroid injection in rt knee   Prediabetes 03/01/2022   hgA1c was 6.1, checks cbg at home, no meds at this time   Past Surgical History:  Procedure Laterality Date   ANAL FISTULECTOMY  1987   APPENDECTOMY     COLONOSCOPY  04/08/2021   KNEE SURGERY Bilateral    meniscal tear repairs   POLYPECTOMY     ROTATOR CUFF REPAIR Bilateral 2018   TONSILLECTOMY     UPPER GASTROINTESTINAL ENDOSCOPY  Allergies  Allergen Reactions   Gabapentin Other (See Comments)    Made very jittery   Prior to Admission medications   Medication Sig Start Date End Date Taking? Authorizing Provider  ACCU-CHEK GUIDE test strip  10/19/21  Yes [provider]  Accu-Chek Softclix Lancets lancets SMARTSIG:2 Topical Twice Daily 10/15/21  Yes [provider]  blood glucose meter kit and supplies Dispense based on patient and insurance preference. Use up to four times daily  as directed. (FOR ICD-10 E10.9, E11.9).once per day fasting or 2 hours after meals. 10/15/21  Yes [name], MD  clobetasol ointment (TEMOVATE) 0.05 % as needed. As directed 05/15/12  Yes [provider]  finasteride (PROSCAR) 5 MG tablet Take 5 mg by mouth daily. 11/19/21  Yes [provider]  lisinopril (ZESTRIL) 20 MG tablet Take 1 tablet (20 mg total) by mouth daily. 01/14/22  Yes [name], MD  metoprolol succinate (TOPROL-XL) 25 MG 24 hr tablet Take 1 tablet (25 mg total) by mouth daily. 01/14/22  Yes [name], MD  pantoprazole (PROTONIX) 40 MG tablet Take 1 tablet (40 mg total) by mouth daily. 03/08/22  Yes Pyrtle, Lajuan Lines, MD  psyllium (METAMUCIL) 58.6 % powder Take 1 packet by mouth 2 (two) times daily.    Yes [provider]  sildenafil (REVATIO) 20 MG tablet TAKE 1 TO 3 TABLETS BY MOUTH ONCE DAILY AS NEEDED 05/24/22  Yes [name], MD  tamsulosin (FLOMAX) 0.4 MG CAPS capsule Take 0.4 mg by mouth daily. 11/19/21  Yes [provider]  valACYclovir (VALTREX) 500 MG tablet Take by mouth as needed. 02/23/21  Yes [provider]  ENBREL MINI 50 MG/ML SOCT INSERT MINI CARTRIDGE INTO AUTOINJECTOR AND INJECT UNDER THE SKIN EVERY 7 DAYS. Patient not taking: Reported on 07/14/2022 06/05/20   Ofilia Neas, PA-C  nystatin-triamcinolone ointment ([place]) APPLY SPARINGLY TO THE AFFECTED AREA FOR UP TO 7 DAYS Patient not taking: Reported on 07/14/2022 06/23/18   [name], MD   Social History   Socioeconomic History   Marital status: Married    Spouse name: Not on file   Number of children: Not on file   Years of education: Not on file   Highest education level: Not on file  Occupational History   Not on file  Tobacco Use   Smoking status: Never   Smokeless tobacco: Never  Vaping Use   Vaping Use: Never used  Substance and Sexual Activity   Alcohol use: Yes    Alcohol/week: 10.0 standard drinks of alcohol    Types:  10 Standard drinks or equivalent per week    Comment: 2 cocktail 5 days every week   Drug use: Never   Sexual activity: Yes  Other Topics Concern   Not on file  Social History Narrative   Sales rep   Social Determinants of Health   Financial Resource Strain: Not on file  Food Insecurity: Not on file  Transportation Needs: Not on file  Physical Activity: Not on file  Stress: Not on file  Social Connections: Not on file  Intimate Partner Violence: Not on file    Observations/Objective: Vitals:   07/14/22 1144  BP: (!) 141/74  Temp: 100 F (37.8 C)  Weight: 265 lb (120.2 kg)  Height: _0  (1.905 m)  Nontoxic appearance on video.  Speaking in full sentences without respiratory distress, no audible wheeze, stridor.  Equal facial movements.  Appropriate responses, all questions answered with understanding  plan expressed.   Assessment and Plan: COVID-19 virus infection - Plan: nirmatrelvir/ritonavir (PAXLOVID) 20 x 150 MG & 10 x 100MG TABS  Elevated blood pressure reading  Acute cough - Plan: benzonatate (TESSALON) 100 MG capsule, guaiFENesin-codeine 100-10 MG/5ML syrup Day 2 of COVID-19 infection.  Some elevated blood pressures initially.  Improved with higher dosing of Toprol temporarily.  Based on symptoms we will continue outpatient treatment.  No red flags on history.  Symptomatic care discussed with Mucinex or Mucinex DM, Tessalon Perles prescribed if needed as well as codeine cough syrup at night.  Saline nasal spray, fluids, rest.  Antivirals discussed including potential risks, side effects and risk of rebound COVID as well as treatment if that were to occur.  He did elect to take antivirals.  Paxlovid prescribed, hold sildenafil and monitor blood pressure with use of tamsulosin.  ER/RTC precautions given, as well as information from [facility] regarding isolation and masking precautions.  Follow Up Instructions:  As needed  Patient Instructions  Ok to stay on 66m toprol, but  decrease back to 257monce blood pressures come down.  Mucinex or Mucinex DM for cough. I also sent in tessalon perles if needed or codeien cough syrup at night. Saline nasal spray or netipot for nasal congestion.  Continue to drink plenty of fluids, rest.  Return to the clinic or go to the nearest emergency room if any of your symptoms worsen or new symptoms occur.  Paxlovid sent to your pharmacy, avoid sildenafil for 1 week.  Watch blood pressures, and if they do decrease go back to lower dose of Toprol.  Even on low-dose of Toprol watch blood pressures as tamsulosin and Paxlovid may cause blood pressures to decrease further.  Your employer may have specific requirements for return to work, but this information is provided from the CDC. There is a link provided below for more information if needed.   Everyone who has presumed or confirmed COVID-19 should stay home and isolate from other people for at least 5 full days (day 0 is the first day of symptoms or the date of the day of the positive viral test for asymptomatic persons). You can end isolation after 5 full days if you are fever-free for 24 hours without the use of fever-reducing medication and your other symptoms have improved (Loss of taste and smell may persist for weeks or months after recovery and need not delay the end of isolation). You should continue to wear a well-fitting mask around others at home and in public for 5 additional days (day 6 through day 10) after the end of your 5-day isolation period. If you are unable to wear a mask when around others, you should continue to isolate for a full 10 days. Avoid people who have weakened immune systems or are more likely to get very sick from COVID-19, and nursing homes and other high-risk settings, until after at least 10 days.  If you continue to have fever or your other symptoms have not improved after 5 days of isolation, you should wait to end your isolation until you are fever-free for  24 hours without the use of fever-reducing medication and your other symptoms have improved. Continue to wear a well-fitting mask through day 10.   hthttps://brown.org/tml    I discussed the assessment and treatment plan with the patient. The patient was provided an opportunity to ask questions and all were answered. The patient agreed with the plan and demonstrated an understanding of the instructions.  The patient was advised to call back or seek an in-person evaluation if the symptoms worsen or if the condition fails to improve as anticipated.   [name], MD

## 2022-07-14 NOTE — Telephone Encounter (Signed)
Discussed at office visit today. 

## 2022-08-03 ENCOUNTER — Other Ambulatory Visit: Payer: Self-pay | Admitting: Family Medicine

## 2022-08-03 DIAGNOSIS — I10 Essential (primary) hypertension: Secondary | ICD-10-CM

## 2022-08-11 ENCOUNTER — Encounter: Payer: Self-pay | Admitting: Internal Medicine

## 2023-01-10 ENCOUNTER — Other Ambulatory Visit: Payer: Self-pay | Admitting: Family Medicine

## 2023-01-10 DIAGNOSIS — N529 Male erectile dysfunction, unspecified: Secondary | ICD-10-CM

## 2023-02-09 ENCOUNTER — Other Ambulatory Visit: Payer: Self-pay | Admitting: Family Medicine

## 2023-02-09 DIAGNOSIS — I10 Essential (primary) hypertension: Secondary | ICD-10-CM

## 2023-02-23 ENCOUNTER — Encounter (HOSPITAL_COMMUNITY): Payer: Self-pay | Admitting: Emergency Medicine

## 2023-02-23 ENCOUNTER — Ambulatory Visit
Admission: EM | Admit: 2023-02-23 | Discharge: 2023-02-23 | Disposition: A | Discharge status: Home / Self Care | Payer: BC Managed Care – PPO | Attending: Internal Medicine | Admitting: Internal Medicine

## 2023-02-23 ENCOUNTER — Other Ambulatory Visit: Payer: Self-pay

## 2023-02-23 ENCOUNTER — Ambulatory Visit: Payer: Self-pay

## 2023-02-23 ENCOUNTER — Emergency Department (HOSPITAL_COMMUNITY)
Admission: EM | Admit: 2023-02-23 | Discharge: 2023-02-24 | Discharge status: Against Medical Advice | Payer: BC Managed Care – PPO | Attending: Emergency Medicine | Admitting: Emergency Medicine

## 2023-02-23 DIAGNOSIS — R739 Hyperglycemia, unspecified: Secondary | ICD-10-CM | POA: Diagnosis present

## 2023-02-23 DIAGNOSIS — R42 Dizziness and giddiness: Secondary | ICD-10-CM

## 2023-02-23 DIAGNOSIS — Z5321 Procedure and treatment not carried out due to patient leaving prior to being seen by health care provider: Secondary | ICD-10-CM | POA: Diagnosis not present

## 2023-02-23 LAB — I-STAT CHEM 8, ED
BUN: 18 mg/dL (ref 8–23)
Calcium, Ion: 1.24 mmol/L (ref 1.15–1.40)
Chloride: 99 mmol/L (ref 98–111)
Creatinine, Ser: 0.7 mg/dL (ref 0.61–1.24)
Glucose, Bld: 427 mg/dL — ABNORMAL HIGH (ref 70–99)
HCT: 45 % (ref 39.0–52.0)
Hemoglobin: 15.3 g/dL (ref 13.0–17.0)
Potassium: 4.8 mmol/L (ref 3.5–5.1)
Sodium: 129 mmol/L — ABNORMAL LOW (ref 135–145)
TCO2: 20 mmol/L — ABNORMAL LOW (ref 22–32)

## 2023-02-23 LAB — URINALYSIS, ROUTINE W REFLEX MICROSCOPIC
Bacteria, UA: NONE SEEN
Bilirubin Urine: NEGATIVE
Glucose, UA: >=500 mg/dL — AB
Hgb urine dipstick: NEGATIVE
Ketones, ur: 5 mg/dL — AB
Leukocytes,Ua: NEGATIVE
Nitrite: NEGATIVE
Protein, ur: NEGATIVE mg/dL
Specific Gravity, Urine: 1.027 (ref 1.005–1.030)
pH: 6 (ref 5.0–8.0)

## 2023-02-23 LAB — CBC
HCT: 42 % (ref 39.0–52.0)
Hemoglobin: 15.1 g/dL (ref 13.0–17.0)
MCH: 33 pg (ref 26.0–34.0)
MCHC: 36 g/dL (ref 30.0–36.0)
MCV: 91.7 fL (ref 80.0–100.0)
Platelets: 83 10*3/uL — ABNORMAL LOW (ref 150–400)
RBC: 4.58 MIL/uL (ref 4.22–5.81)
RDW: 13.2 % (ref 11.5–15.5)
WBC: 5.4 10*3/uL (ref 4.0–10.5)
nRBC: 0 % (ref 0.0–0.2)

## 2023-02-23 LAB — POCT FASTING CBG KUC MANUAL ENTRY: POCT Glucose (KUC): 501 mg/dL — AB (ref 70–99)

## 2023-02-23 LAB — CBG MONITORING, ED: Glucose-Capillary: 405 mg/dL — ABNORMAL HIGH (ref 70–99)

## 2023-02-23 NOTE — ED Triage Notes (Signed)
Pt c/o elevated BS/noncompliant checking BS x 1 year-last BS 515pm today was 554-states dx prediabetes-no DM dx/no meds-pt NAD-steady gait

## 2023-02-23 NOTE — ED Notes (Signed)
Patient is being discharged from the Urgent Care and sent to the Emergency Department via POV . Per Vicenta Aly, NP, patient is in need of higher level of care due to hyperglycemia. Patient is aware and verbalizes understanding of plan of care.  Vitals:   02/23/23 1850  BP: (!) 195/96  Pulse: 75  Resp: 20  Temp: 98.4 F (36.9 C)  SpO2: 95%

## 2023-02-23 NOTE — ED Provider Notes (Signed)
Patient presents to urgent care with his wife for evaluation after elevated blood sugar reading at home this afternoon.  Patient is a prediabetic and has not checked his blood sugar for the last 6 months.  He just returned from vacation yesterday and chose to check his blood sugar this morning and attempt to get healthier. Blood sugar this morning after 7 hours fasting was 284.  He ate some lunch, rechecked his sugar again in a couple of hours, and found it to be over 500. He read online that drinking a V8 juice may help with elevated blood sugar and decided to go for a walk and attempt to reduce blood sugar level.  While he was on the walk outside, he became dizzy, slightly disoriented per wife, and needed to turn around and go back home. He now presents to urgent care with report of polydipsia and polyuria over the last several weeks with elevated blood sugar at 501 per our CBG monitor.Denies syncope, chest pain, heart palpitations, and shortness of breath.   Currently neurologically intact at baseline and alert and oriented x 4.  No focal neurologic deficit.  Vital signs stable.  Recommend workup in the emergency department to safely lower blood sugar in the setting of new type 2 diabetes with hyperglycemia. I would like for patient to go to the nearest emergency department via EMS, however he declines.  Discussed risks of going to the ED via personal vehicle with his wife, he further expresses understanding and agreement with plan to proceed straight to the nearest ER.  Discharged from urgent care in stable condition with his wife.    Carlisle Beers, [place] 02/23/23 1912

## 2023-02-23 NOTE — ED Triage Notes (Signed)
Patient coming to ED for evaluation of hyperglycemia.  Reports he has been dx with prediabetes.  No hx of diabetes.  No current medications.  No reports of pain, nausea, or vomiting.  Was seen at Urgent Care prior to coming to ED.  Was sent for further evaluation.

## 2023-02-24 ENCOUNTER — Ambulatory Visit: Payer: BC Managed Care – PPO | Admitting: Family Medicine

## 2023-02-24 ENCOUNTER — Encounter: Payer: Self-pay | Admitting: Family Medicine

## 2023-02-24 VITALS — BP 132/74 | HR 58 | Temp 98.6°F | Ht 75.0 in | Wt 259.2 lb

## 2023-02-24 DIAGNOSIS — Z794 Long term (current) use of insulin: Secondary | ICD-10-CM

## 2023-02-24 DIAGNOSIS — Z7984 Long term (current) use of oral hypoglycemic drugs: Secondary | ICD-10-CM

## 2023-02-24 DIAGNOSIS — D696 Thrombocytopenia, unspecified: Secondary | ICD-10-CM

## 2023-02-24 DIAGNOSIS — E1165 Type 2 diabetes mellitus with hyperglycemia: Secondary | ICD-10-CM | POA: Diagnosis not present

## 2023-02-24 DIAGNOSIS — E871 Hypo-osmolality and hyponatremia: Secondary | ICD-10-CM | POA: Diagnosis not present

## 2023-02-24 DIAGNOSIS — R739 Hyperglycemia, unspecified: Secondary | ICD-10-CM

## 2023-02-24 LAB — POCT URINALYSIS DIP (MANUAL ENTRY)
Bilirubin, UA: NEGATIVE
Blood, UA: NEGATIVE
Glucose, UA: >=1000 mg/dL — AB
Ketones, POC UA: NEGATIVE mg/dL
Leukocytes, UA: NEGATIVE
Nitrite, UA: NEGATIVE
Protein Ur, POC: NEGATIVE mg/dL
Spec Grav, UA: 1.01 (ref 1.010–1.025)
Urobilinogen, UA: 0.2 E.U./dL — AB
pH, UA: 6 (ref 5.0–8.0)

## 2023-02-24 LAB — GLUCOSE, POCT (MANUAL RESULT ENTRY): POC Glucose: 429 mg/dl — AB (ref 70–99)

## 2023-02-24 MED ORDER — ACCU-CHEK GUIDE VI STRP: ORAL_STRIP | 3 refills | Status: DC

## 2023-02-24 MED ORDER — LANTUS SOLOSTAR 100 UNIT/ML ~~LOC~~ SOPN: 10.0000 [IU] | PEN_INJECTOR | Freq: Every day | SUBCUTANEOUS | 99 refills | Status: DC

## 2023-02-24 MED ORDER — INSULIN PEN NEEDLE 30G X 8 MM MISC
1.0000 | 5 refills | Status: DC | PRN
Start: 2023-02-24 — End: 2024-03-21

## 2023-02-24 MED ORDER — METFORMIN HCL 500 MG PO TABS: 500.0000 mg | ORAL_TABLET | Freq: Two times a day (BID) | ORAL | 3 refills | Status: DC

## 2023-02-24 NOTE — Progress Notes (Signed)
Subjective:  Patient ID: Nathaniel Velazquez, male    DOB: 1961-01-11  Age: 62 y.o. MRN: [id number]  CC:  Chief Complaint  Patient presents with   Hyperglycemia    Pt notes left ED after waiting 3 hours BG was 500, frequent urination, pt had a bowl of plain cheerios and half a banana about 60-90 minutes ago BG in office 429    HPI Nathaniel Velazquez presents for   Hyperglycemia, urgent care/ER follow-up Last visit with me in June 2023.  Previously had been diagnosed with new onset type 2 diabetes, but impressive improvement in A1c with his weight loss and diet changes that time, 69-month follow-up recommended.  A1c was 6.1 at June 2023 visit, previously 7.5 in March.  Has not had visit in office since June of last year.  ER notes reviewed from 02/23/2023.  Initially was seen in urgent care.  Note reviewed, elevated blood sugar at home.  Prediabetic but had not checked his blood sugar for 6 months.  He just returned from vacation and blood sugar was 284 fasting, then over 500 after lunch.  Drink V8 juice, left for a walk and then became dizzy, slightly disoriented.  At urgent care reported polydipsia, polyuria for multiple weeks and blood sugar 501 in urgent care, sent to ER. Labs reviewed from ER including initial CBG 405 at 7:34 PM yesterday, i-STAT with sodium 129, glucose 427, CO2 low at 20 at 8:13 PM yesterday.   Urinalysis with greater than 500 glucose, 5 ketones.  CBC with thrombocytopenia, platelets 83, down slightly from 93 in May of last year. CBG 294 at 1:10 AM today.  No other ER notes available.  Left ER prior to being seen due to wait time.   Feeling a little better since yesterday.  Has been on low dose prednisone past month for psoriasis, and more take out with kitchen remodel. Now done.  Had been having increased thirst, urinary frequency on vacation recently.  10-15# weight loss past few months.  Home reading this am 321 fasting. No abd pain, no n/v.  Feels a little fuzzy, but  not as bad as yesterday, and no further dizziness. No focal weakness, slurred speech or facial droop.  No current diabetic meds.  No new bleeding.  Plan for [place] trip in 2 weeks.   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   Body mass index (BMI) 33.0-33.9, adult 09/19/2018   Family history of cardiovascular disease 09/19/2018   Moderate alcohol consumption 09/19/2018   Psoriasis 09/19/2018   Psoriatic arthritis (HCC) 09/19/2018   Stiffness of right shoulder joint 08/19/2017   Traumatic complete tear of rotator cuff 08/04/2017   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   Dysfunction of right eustachian tube 11/18/2015   Anal fissure 07/22/2015   Hypertensive disorder 07/03/2015   Obesity    Hyperlipidemia 11/21/2008   PALPITATIONS 10/30/2008   Past Medical History:  Diagnosis Date   Arthritis    Cancer (HCC)    skin cancer   GERD (gastroesophageal reflux disease)    Hypertension    Obesity    Steroid injection in rt knee   Prediabetes 03/01/2022   hgA1c was 6.1,  checks cbg at home, no meds at this time   Past Surgical History:  Procedure Laterality Date   ANAL FISTULECTOMY  1987   APPENDECTOMY     COLONOSCOPY  04/08/2021   KNEE SURGERY Bilateral    meniscal tear repairs   POLYPECTOMY     ROTATOR CUFF REPAIR Bilateral 2018   TONSILLECTOMY     UPPER GASTROINTESTINAL ENDOSCOPY     Allergies  Allergen Reactions   Gabapentin Other (See Comments)    Made very jittery   Prior to Admission medications   Medication Sig Start Date End Date Taking? Authorizing Provider  ACCU-CHEK GUIDE test strip  10/19/21   [provider]  Accu-Chek Softclix Lancets lancets SMARTSIG:2 Topical  Twice Daily 10/15/21   [provider]  benzonatate (TESSALON) 100 MG capsule Take 1 capsule (100 mg total) by mouth 3 (three) times daily as needed for cough. 07/14/22   Shade Flood, MD  blood glucose meter kit and supplies Dispense based on patient and insurance preference. Use up to four times daily as directed. (FOR ICD-10 E10.9, E11.9).once per day fasting or 2 hours after meals. 10/15/21   Shade Flood, MD  clobetasol ointment (TEMOVATE) 0.05 % as needed. As directed 05/15/12   [provider]  ENBREL MINI 50 MG/ML SOCT INSERT MINI CARTRIDGE INTO AUTOINJECTOR AND INJECT UNDER THE SKIN EVERY 7 DAYS. Patient not taking: Reported on 07/14/2022 06/05/20   Gearldine Bienenstock, PA-C  finasteride (PROSCAR) 5 MG tablet Take 5 mg by mouth daily. 11/19/21   [provider]  guaiFENesin-codeine 100-10 MG/5ML syrup Take 5-10 mLs by mouth at bedtime as needed for cough. 07/14/22   Shade Flood, MD  lisinopril (ZESTRIL) 20 MG tablet TAKE 1 TABLET BY MOUTH DAILY 02/09/23   Shade Flood, MD  metoprolol succinate (TOPROL-XL) 25 MG 24 hr tablet TAKE 1 TABLET BY MOUTH DAILY 02/09/23   Shade Flood, MD  pantoprazole (PROTONIX) 40 MG tablet Take 1 tablet (40 mg total) by mouth daily. 03/08/22   Pyrtle, Carie Caddy, MD  psyllium (METAMUCIL) 58.6 % powder Take 1 packet by mouth 2 (two) times daily.     [provider]  sildenafil (REVATIO) 20 MG tablet TAKE 1 TO 3 TABLETS BY MOUTH DAILY AS NEEDED 01/10/23   Shade Flood, MD  tamsulosin (FLOMAX) 0.4 MG CAPS capsule Take 0.4 mg by mouth daily. 11/19/21   [provider]  valACYclovir (VALTREX) 500 MG tablet Take by mouth as needed. 02/23/21   [provider]   Social History   Socioeconomic History   Marital status: Married    Spouse name: Not on file   Number of children: Not on file   Years of education: Not on file   Highest education level: Not on file  Occupational History   Not on file   Tobacco Use   Smoking status: Never   Smokeless tobacco: Never  Vaping Use   Vaping status: Never Used  Substance and Sexual Activity   Alcohol use: Not Currently    Comment: occ   Drug use: Never   Sexual activity: Yes  Other Topics Concern   Not on file  Social History Narrative   Sales rep   Social Determinants of Health   Financial Resource Strain: Not on file  Food Insecurity: Not on file  Transportation Needs: Not on file  Physical Activity: Not on file  Stress: Not on file  Social Connections: Not on file  Intimate Partner  Violence: Not on file    Review of Systems Per HPI.   Objective:   Vitals:   02/24/23 1144  BP: 132/74  Pulse: (!) 58  Temp: 98.6 F (37 C)  TempSrc: Temporal  SpO2: 98%  Weight: 259 lb 3.2 oz (117.6 kg)  Height: 6\' 3"  (1.905 m)     Physical Exam    Results for orders placed or performed in visit on 02/24/23  POCT glucose (manual entry)  Result Value Ref Range   POC Glucose 429 (A) 70 - 99 mg/dl  POCT urinalysis dipstick  Result Value Ref Range   Color, UA yellow yellow   Clarity, UA clear clear   Glucose, UA >=1,000 (A) negative mg/dL   Bilirubin, UA negative negative   Ketones, POC UA negative negative mg/dL   Spec Grav, UA 4.098 1.191 - 1.025   Blood, UA negative negative   pH, UA 6.0 5.0 - 8.0   Protein Ur, POC negative negative mg/dL   Urobilinogen, UA 0.2 (A) 0.2 or 1.0 E.U./dL   Nitrite, UA Negative Negative   Leukocytes, UA Negative Negative   Over 40 minutes spent during visit, including chart review, urine and lab review, counseling and assimilation of information, exam, discussion of plan, and chart completion.   Assessment & Plan:  TENZING LINDOR is a 62 y.o. male . Type 2 diabetes mellitus with hyperglycemia, unspecified whether long term insulin use (HCC) - Plan: Hemoglobin A1c, Comprehensive metabolic panel, insulin glargine (LANTUS SOLOSTAR) 100 UNIT/ML Solostar Pen, Insulin Pen Needle (NOVOFINE) 30G  X 8 MM MISC, metFORMIN (GLUCOPHAGE) 500 MG tablet Hyperglycemia - Plan: Comprehensive metabolic panel, POCT glucose (manual entry), POCT urinalysis dipstick  -Uncontrolled diabetes, with likely progression past few months, potentially worsened by use of prednisone and dietary indiscretion.  Concern for possible DKA based on symptoms yesterday and ER labs.  However he does feel better today, no ketonuria on in office testing.  Will check a BMP for bicarb assessment, initially will start with home treatment, Lantus 10 units/day, start metformin initially 500 mg daily for 1 week then twice daily dosing.  Close monitoring of home blood sugars with ER precautions given if any worsening of symptoms.  Recheck in 4 days.  Teaching on use of insulin pen.  Hyponatremia - Plan: Comprehensive metabolic panel  -Minimal when corrected for hyperglycemia, check labs.  Thrombocytopenia (HCC) - Plan: CBC  -Mildly low, similar to previous reading.  Denies new bleeding, repeat CBC.  Recheck 4 days.  Meds ordered this encounter  Medications   insulin glargine (LANTUS SOLOSTAR) 100 UNIT/ML Solostar Pen    Sig: Inject 10 Units into the skin daily.    Dispense:  15 mL    Refill:  PRN   Insulin Pen Needle (NOVOFINE) 30G X 8 MM MISC    Sig: Inject 10 each into the skin as needed.    Dispense:  30 each    Refill:  5   metFORMIN (GLUCOPHAGE) 500 MG tablet    Sig: Take 1 tablet (500 mg total) by mouth 2 (two) times daily with a meal. Start every day for initial week.    Dispense:  180 tablet    Refill:  3   ACCU-CHEK GUIDE test strip    Sig: 3 times per day - fasting or 2 hours after meal.    Dispense:  100 each    Refill:  3   Patient Instructions  Start Lantus insulin 10 units/day today and tomorrow.  Measure blood sugars  fasting and 2 hours after meals.  Can increase the dose of Lantus by 2 units every 2 days until you obtain readings below 200, then stay at that dose.  Start metformin 1 pill/day for now then  after 1 week if that is tolerated can increase to twice per day.  I will check labs today and if the acid test or electrolytes are concerning, I may have you go to the emergency room but I am hoping that we will be able to continue to treat this outpatient.  See information below on diabetes and diet, but we will continue to have those discussions on follow-up.  I will see you on Monday.  Return to the clinic or go to the nearest emergency room if any of your symptoms worsen or new symptoms occur.   Type 2 Diabetes Mellitus, Self-Care, Adult Caring for yourself after you have been diagnosed with type 2 diabetes (type 2 diabetes mellitus) means keeping your blood sugar (glucose) under control with a balance of: Nutrition. Exercise. Lifestyle changes. Medicines or insulin, if needed. Support from your team of health care providers and others. What are the risks? Having type 2 diabetes can put you at risk for other long-term (chronic) conditions, such as heart disease and kidney disease. Your health care provider may prescribe medicines to help prevent complications from diabetes. How to monitor your blood glucose  Check your blood glucose every day or as often as told by your health care provider. Have your A1C (hemoglobin A1C) level checked two or more times a year, or as often as told by your health care provider. Your health care provider will set personalized treatment goals for you. Generally, the goal of treatment is to maintain the following blood glucose levels: Before meals: 80-130 mg/dL (9.1-4.7 mmol/L). After meals: below 180 mg/dL (10 mmol/L). A1C level: less than 7%. How to manage hyperglycemia and hypoglycemia Hyperglycemia symptoms Hyperglycemia, also called high blood glucose, occurs when blood glucose is too high. Make sure you know the early signs of hyperglycemia, such as: Increased thirst. Hunger. Feeling very tired. Needing to urinate more often than usual. Blurry  vision. Hypoglycemia symptoms Hypoglycemia, also called low blood glucose, occurs with a blood glucose level at or below 70 mg/dL (3.9 mmol/L). Diabetes medicines lower your blood glucose and can cause hypoglycemia. The risk for hypoglycemia increases during or after exercise, during sleep, during illness, and when skipping meals or not eating for a long time (fasting). It is important to know the symptoms of hypoglycemia and treat it right away. Always have a 15-gram rapid-acting carbohydrate snack with you to treat low blood glucose. Family members and close friends should also know the symptoms and understand how to treat hypoglycemia, in case you are not able to treat yourself. Symptoms may include: Hunger. Anxiety. Sweating and feeling clammy. Dizziness or feeling light-headed. Sleepiness. Increased heart rate. Irritability. Tingling or numbness around the mouth, lips, or tongue. Restless sleep. Severe hypoglycemia is when your blood glucose level is at or below 54 mg/dL (3 mmol/L). Severe hypoglycemia is an emergency. Do not wait to see if the symptoms will go away. Get medical help right away. Call your local emergency services (911 in the [place] Do not drive yourself to the hospital. If you have severe hypoglycemia and you cannot eat or drink, you may need glucagon. A family member or close friend should learn how to check your blood glucose and how to give you glucagon. Ask your health care provider if you  need to have an emergency glucagon kit available. Follow these instructions at home: Medicines Take prescribed insulin or diabetes medicines as told by your health care provider. Do not run out of insulin or other diabetes medicines. Plan ahead so you always have these available. If you use insulin, adjust your dosage based on your physical activity and what foods you eat. Your health care provider will tell you how to adjust your dosage. Take over-the-counter and prescription  medicines only as told by your health care provider. Eating and drinking  What you eat and drink affects your blood glucose and your insulin dosage. Making good choices helps to control your diabetes and prevent other health problems. A healthy meal plan includes eating lean proteins, complex carbohydrates, fresh fruits and vegetables, low-fat dairy products, and healthy fats. Make an appointment to see a registered dietitian to help you create an eating plan that is right for you. Make sure that you: Follow instructions from your health care provider about eating or drinking restrictions. Drink enough fluid to keep your urine pale yellow. Keep a record of the carbohydrates that you eat. Do this by reading food labels and learning the standard serving sizes of foods. Follow your sick-day plan whenever you cannot eat or drink as usual. Make this plan in advance with your health care provider.  Activity Stay active. Exercise regularly, as told by your health care provider. This may include: Stretching and doing strength exercises, such as yoga or weight lifting, two or more times a week. Doing 150 minutes or more of moderate-intensity or vigorous-intensity exercise each week. This could be brisk walking, biking, or water aerobics. Spread out your activity over 3 or more days of the week. Do not go more than 2 days in a row without doing some kind of physical activity. When you start a new exercise or activity, work with your health care provider to adjust your insulin, medicines, or food intake as needed. Lifestyle Do not use any products that contain nicotine or tobacco. These products include cigarettes, chewing tobacco, and vaping devices, such as e-cigarettes. If you need help quitting, ask your health care provider. If you drink alcohol and your health care provider says that it is safe for you: Limit how much you have to: 0-1 drink a day for women who are not pregnant. 0-2 drinks a day for  men. Know how much alcohol is in your drink. In the [place] one drink equals one 12 oz bottle of beer (355 mL), one 5 oz glass of wine (148 mL), or one 1 oz glass of hard liquor (44 mL). Learn to manage stress. If you need help with this, ask your health care provider. Take care of your body  Keep your immunizations up to date. In addition to getting vaccinations as told by your health care provider, it is recommended that you get vaccinated against the following illnesses: The flu (influenza). Get a flu shot every year. Pneumonia. Hepatitis B. Schedule an eye exam soon after your diagnosis, and then one time every year after that. Check your skin and feet every day for cuts, bruises, redness, blisters, or sores. Schedule a foot exam with your health care provider once every year. Brush your teeth and gums two times a day, and floss one or more times a day. Visit your dentist one or more times every 6 months. Maintain a healthy weight. General instructions Share your diabetes management plan with people in your workplace, school, and household. Carry a medical  alert card or wear medical alert jewelry. Keep all follow-up visits. This is important. Questions to ask your health care provider Should I meet with a certified diabetes care and education specialist? Where can I find a support group for people with diabetes? Where to find more information For help and guidance and for more information about diabetes, please visit: American Diabetes Association (ADA): www.diabetes.org American Association of Diabetes Care and Education Specialists (ADCES): www.diabeteseducator.org International Diabetes Federation (IDF): DCOnly.dk Summary Caring for yourself after you have been diagnosed with type 2 diabetes (type 2 diabetes mellitus) means keeping your blood sugar (glucose) under control with a balance of nutrition, exercise, lifestyle changes, and medicine. Check your blood glucose every day, as  often as told by your health care provider. Having diabetes can put you at risk for other long-term (chronic) conditions, such as heart disease and kidney disease. Your health care provider may prescribe medicines to help prevent complications from diabetes. Share your diabetes management plan with people in your workplace, school, and household. Keep all follow-up visits. This is important. This information is not intended to replace advice given to you by your health care provider. Make sure you discuss any questions you have with your health care provider. Document Revised: 12/03/2020 Document Reviewed: 12/03/2020 Elsevier Patient Education  2024 Elsevier Inc.   Insulin Injection Instructions, Using Insulin Pens, Adult There are many different types of insulin. The type of insulin that you take may determine how many injections you give yourself and when you need to give the injections. Supplies needed: Soap and water. Your insulin pen. A new needle. Alcohol wipes. A disposal container for sharp items (sharps container), such as an empty plastic bottle with a cover. How to choose a site for injection The body absorbs insulin differently, depending on where the insulin is injected (injection site). It is best to inject insulin into the same body area each time; for example, always in the abdomen. However, you should use a different spot in that area for each injection. Do not inject the insulin in the same spot each time. There are five main areas that can be used for injecting. These areas are: Abdomen. This is the preferred area. Front of thigh. Upper, outer side of thigh. Upper, outer side of arm. Upper, outer part of buttock. How to use an insulin pen  Get ready Wash your hands with soap and water for at least 20 seconds. If soap and water are not available, use hand sanitizer. Test your blood sugar (glucose) level and write down that number. Follow any instructions from your  health care provider about what to do if your blood glucose level is higher or lower than your normal range. Make sure the insulin pen has the right kind of insulin and there is enough insulin in the pen for the dose. Check the expiration date. If you are using CLEAR insulin, check to see that it is clear and free of clumps. If you are using CLOUDY insulin, mix it by gently rolling the insulin pen between your palms several times. Do not shake the pen. Remove the cap from the insulin pen. Use an alcohol wipe to clean the rubber tip of the pen. Remove the protective paper tab from the disposable needle. Do not let the needle touch anything. Screw a new, unused needle onto the pen. Remove the outer plastic needle cover. Do not throw away the outer plastic cover yet. If the pen uses a special safety needle, leave the inner  needle shield in place. If the pen does not use a special safety needle, remove the inner plastic cover from the needle. If you skip this step, you may not get the right amount of insulin. Follow the manufacturer's instructions to prime the insulin pen with the volume of insulin needed. Hold the pen with the needle pointing up, and push the button on the opposite end of the pen until a drop of insulin appears at the needle tip. If no insulin appears, repeat this step. Turn the button (dial) to the number of units of insulin that you will be injecting. Inject the insulin Use an alcohol wipe to clean the site where you will be inserting the needle. Let the site air-dry. Grip the base of the pen with a loose fist and rest your thumb on the pen or hold the pen in the palm of your writing hand like a pencil. If directed by your health care provider, use your other hand to pinch and hold about 1 inch (2.5 cm) of skin at the injection site. Do not directly touch the cleaned part of the skin. Gently but quickly, use your writing hand to put the needle straight into the skin. Insert the  needle at a 45-degree angle or a 90-degree angle (perpendicular) to the skin, as directed by your health care provider. When the needle is completely inserted into the skin, let go of the skin that you are pinching. Use your thumb or index finger of your writing hand to push the top button of the pen all the way to inject the insulin. Continue to hold the pen in place with your writing hand. Wait 10 seconds, then pull the needle straight out of the skin. This will allow all of the insulin to go from the pen and needle into your body. Carefully put the outer plastic cover of the needle back over the needle, then unscrew the capped needle and discard it in a sharps container, such as an empty plastic bottle with a cover. Put the plastic cap back on the insulin pen. How to throw away supplies Discard all used needles in a sharps container. Follow the disposal regulations for the area where you live. Do not use any needle more than one time. Throw away empty disposable pens in the regular trash. Questions to ask your health care provider How often should I be taking insulin? How often should I check my blood glucose? What amount of insulin should I be taking each time? What are the side effects? What should I do if my blood glucose is too high? What should I do if my blood glucose is too low? What should I do if I forget to take my insulin? What number should I call if I have questions? Where to find more information American Diabetes Association (ADA): www.diabetes.org Association of Diabetes Care and Education Specialists (ADCES): www.diabeteseducator.org Summary Before you give yourself an insulin injection, be sure to wash your hands for at least 20 seconds and test your blood glucose level. Write down that number. Check the expiration date and the type of insulin that is in the pen. The type of insulin that you take may determine how many injections you give yourself and when you need to  give the injections. It is best to inject insulin into the same body area each time; for example, always in the abdomen. However, you should use a different spot in that area for each injection. Do not use a needle more  than one time. This information is not intended to replace advice given to you by your health care provider. Make sure you discuss any questions you have with your health care provider. Document Revised: 09/22/2020 Document Reviewed: 05/03/2020 Elsevier Patient Education  2024 Elsevier Inc.     Signed,   Meredith Staggers, MD [facility], [facility] 02/24/23 2:50 PM

## 2023-02-24 NOTE — Patient Instructions (Signed)
Start Lantus insulin 10 units/day today and tomorrow.  Measure blood sugars fasting and 2 hours after meals.  Can increase the dose of Lantus by 2 units every 2 days until you obtain readings below 200, then stay at that dose.  Start metformin 1 pill/day for now then after 1 week if that is tolerated can increase to twice per day.  I will check labs today and if the acid test or electrolytes are concerning, I may have you go to the emergency room but I am hoping that we will be able to continue to treat this outpatient.  See information below on diabetes and diet, but we will continue to have those discussions on follow-up.  I will see you on Monday.  Return to the clinic or go to the nearest emergency room if any of your symptoms worsen or new symptoms occur.   Type 2 Diabetes Mellitus, Self-Care, Adult Caring for yourself after you have been diagnosed with type 2 diabetes (type 2 diabetes mellitus) means keeping your blood sugar (glucose) under control with a balance of: Nutrition. Exercise. Lifestyle changes. Medicines or insulin, if needed. Support from your team of health care providers and others. What are the risks? Having type 2 diabetes can put you at risk for other long-term (chronic) conditions, such as heart disease and kidney disease. Your health care provider may prescribe medicines to help prevent complications from diabetes. How to monitor your blood glucose  Check your blood glucose every day or as often as told by your health care provider. Have your A1C (hemoglobin A1C) level checked two or more times a year, or as often as told by your health care provider. Your health care provider will set personalized treatment goals for you. Generally, the goal of treatment is to maintain the following blood glucose levels: Before meals: 80-130 mg/dL (6.2-9.5 mmol/L). After meals: below 180 mg/dL (10 mmol/L). A1C level: less than 7%. How to manage hyperglycemia and  hypoglycemia Hyperglycemia symptoms Hyperglycemia, also called high blood glucose, occurs when blood glucose is too high. Make sure you know the early signs of hyperglycemia, such as: Increased thirst. Hunger. Feeling very tired. Needing to urinate more often than usual. Blurry vision. Hypoglycemia symptoms Hypoglycemia, also called low blood glucose, occurs with a blood glucose level at or below 70 mg/dL (3.9 mmol/L). Diabetes medicines lower your blood glucose and can cause hypoglycemia. The risk for hypoglycemia increases during or after exercise, during sleep, during illness, and when skipping meals or not eating for a long time (fasting). It is important to know the symptoms of hypoglycemia and treat it right away. Always have a 15-gram rapid-acting carbohydrate snack with you to treat low blood glucose. Family members and close friends should also know the symptoms and understand how to treat hypoglycemia, in case you are not able to treat yourself. Symptoms may include: Hunger. Anxiety. Sweating and feeling clammy. Dizziness or feeling light-headed. Sleepiness. Increased heart rate. Irritability. Tingling or numbness around the mouth, lips, or tongue. Restless sleep. Severe hypoglycemia is when your blood glucose level is at or below 54 mg/dL (3 mmol/L). Severe hypoglycemia is an emergency. Do not wait to see if the symptoms will go away. Get medical help right away. Call your local emergency services (911 in the [place] Do not drive yourself to the hospital. If you have severe hypoglycemia and you cannot eat or drink, you may need glucagon. A family member or close friend should learn how to check your blood glucose and how  to give you glucagon. Ask your health care provider if you need to have an emergency glucagon kit available. Follow these instructions at home: Medicines Take prescribed insulin or diabetes medicines as told by your health care provider. Do not run out of insulin  or other diabetes medicines. Plan ahead so you always have these available. If you use insulin, adjust your dosage based on your physical activity and what foods you eat. Your health care provider will tell you how to adjust your dosage. Take over-the-counter and prescription medicines only as told by your health care provider. Eating and drinking  What you eat and drink affects your blood glucose and your insulin dosage. Making good choices helps to control your diabetes and prevent other health problems. A healthy meal plan includes eating lean proteins, complex carbohydrates, fresh fruits and vegetables, low-fat dairy products, and healthy fats. Make an appointment to see a registered dietitian to help you create an eating plan that is right for you. Make sure that you: Follow instructions from your health care provider about eating or drinking restrictions. Drink enough fluid to keep your urine pale yellow. Keep a record of the carbohydrates that you eat. Do this by reading food labels and learning the standard serving sizes of foods. Follow your sick-day plan whenever you cannot eat or drink as usual. Make this plan in advance with your health care provider.  Activity Stay active. Exercise regularly, as told by your health care provider. This may include: Stretching and doing strength exercises, such as yoga or weight lifting, two or more times a week. Doing 150 minutes or more of moderate-intensity or vigorous-intensity exercise each week. This could be brisk walking, biking, or water aerobics. Spread out your activity over 3 or more days of the week. Do not go more than 2 days in a row without doing some kind of physical activity. When you start a new exercise or activity, work with your health care provider to adjust your insulin, medicines, or food intake as needed. Lifestyle Do not use any products that contain nicotine or tobacco. These products include cigarettes, chewing tobacco, and  vaping devices, such as e-cigarettes. If you need help quitting, ask your health care provider. If you drink alcohol and your health care provider says that it is safe for you: Limit how much you have to: 0-1 drink a day for women who are not pregnant. 0-2 drinks a day for men. Know how much alcohol is in your drink. In the [place] one drink equals one 12 oz bottle of beer (355 mL), one 5 oz glass of wine (148 mL), or one 1 oz glass of hard liquor (44 mL). Learn to manage stress. If you need help with this, ask your health care provider. Take care of your body  Keep your immunizations up to date. In addition to getting vaccinations as told by your health care provider, it is recommended that you get vaccinated against the following illnesses: The flu (influenza). Get a flu shot every year. Pneumonia. Hepatitis B. Schedule an eye exam soon after your diagnosis, and then one time every year after that. Check your skin and feet every day for cuts, bruises, redness, blisters, or sores. Schedule a foot exam with your health care provider once every year. Brush your teeth and gums two times a day, and floss one or more times a day. Visit your dentist one or more times every 6 months. Maintain a healthy weight. General instructions Share your diabetes management plan  with people in your workplace, school, and household. Carry a medical alert card or wear medical alert jewelry. Keep all follow-up visits. This is important. Questions to ask your health care provider Should I meet with a certified diabetes care and education specialist? Where can I find a support group for people with diabetes? Where to find more information For help and guidance and for more information about diabetes, please visit: American Diabetes Association (ADA): www.diabetes.org American Association of Diabetes Care and Education Specialists (ADCES): www.diabeteseducator.org International Diabetes Federation (IDF):  DCOnly.dk Summary Caring for yourself after you have been diagnosed with type 2 diabetes (type 2 diabetes mellitus) means keeping your blood sugar (glucose) under control with a balance of nutrition, exercise, lifestyle changes, and medicine. Check your blood glucose every day, as often as told by your health care provider. Having diabetes can put you at risk for other long-term (chronic) conditions, such as heart disease and kidney disease. Your health care provider may prescribe medicines to help prevent complications from diabetes. Share your diabetes management plan with people in your workplace, school, and household. Keep all follow-up visits. This is important. This information is not intended to replace advice given to you by your health care provider. Make sure you discuss any questions you have with your health care provider. Document Revised: 12/03/2020 Document Reviewed: 12/03/2020 Elsevier Patient Education  2024 Elsevier Inc.   Insulin Injection Instructions, Using Insulin Pens, Adult There are many different types of insulin. The type of insulin that you take may determine how many injections you give yourself and when you need to give the injections. Supplies needed: Soap and water. Your insulin pen. A new needle. Alcohol wipes. A disposal container for sharp items (sharps container), such as an empty plastic bottle with a cover. How to choose a site for injection The body absorbs insulin differently, depending on where the insulin is injected (injection site). It is best to inject insulin into the same body area each time; for example, always in the abdomen. However, you should use a different spot in that area for each injection. Do not inject the insulin in the same spot each time. There are five main areas that can be used for injecting. These areas are: Abdomen. This is the preferred area. Front of thigh. Upper, outer side of thigh. Upper, outer side of arm. Upper,  outer part of buttock. How to use an insulin pen  Get ready Wash your hands with soap and water for at least 20 seconds. If soap and water are not available, use hand sanitizer. Test your blood sugar (glucose) level and write down that number. Follow any instructions from your health care provider about what to do if your blood glucose level is higher or lower than your normal range. Make sure the insulin pen has the right kind of insulin and there is enough insulin in the pen for the dose. Check the expiration date. If you are using CLEAR insulin, check to see that it is clear and free of clumps. If you are using CLOUDY insulin, mix it by gently rolling the insulin pen between your palms several times. Do not shake the pen. Remove the cap from the insulin pen. Use an alcohol wipe to clean the rubber tip of the pen. Remove the protective paper tab from the disposable needle. Do not let the needle touch anything. Screw a new, unused needle onto the pen. Remove the outer plastic needle cover. Do not throw away the outer plastic cover yet.  If the pen uses a special safety needle, leave the inner needle shield in place. If the pen does not use a special safety needle, remove the inner plastic cover from the needle. If you skip this step, you may not get the right amount of insulin. Follow the manufacturer's instructions to prime the insulin pen with the volume of insulin needed. Hold the pen with the needle pointing up, and push the button on the opposite end of the pen until a drop of insulin appears at the needle tip. If no insulin appears, repeat this step. Turn the button (dial) to the number of units of insulin that you will be injecting. Inject the insulin Use an alcohol wipe to clean the site where you will be inserting the needle. Let the site air-dry. Grip the base of the pen with a loose fist and rest your thumb on the pen or hold the pen in the palm of your writing hand like a pencil. If  directed by your health care provider, use your other hand to pinch and hold about 1 inch (2.5 cm) of skin at the injection site. Do not directly touch the cleaned part of the skin. Gently but quickly, use your writing hand to put the needle straight into the skin. Insert the needle at a 45-degree angle or a 90-degree angle (perpendicular) to the skin, as directed by your health care provider. When the needle is completely inserted into the skin, let go of the skin that you are pinching. Use your thumb or index finger of your writing hand to push the top button of the pen all the way to inject the insulin. Continue to hold the pen in place with your writing hand. Wait 10 seconds, then pull the needle straight out of the skin. This will allow all of the insulin to go from the pen and needle into your body. Carefully put the outer plastic cover of the needle back over the needle, then unscrew the capped needle and discard it in a sharps container, such as an empty plastic bottle with a cover. Put the plastic cap back on the insulin pen. How to throw away supplies Discard all used needles in a sharps container. Follow the disposal regulations for the area where you live. Do not use any needle more than one time. Throw away empty disposable pens in the regular trash. Questions to ask your health care provider How often should I be taking insulin? How often should I check my blood glucose? What amount of insulin should I be taking each time? What are the side effects? What should I do if my blood glucose is too high? What should I do if my blood glucose is too low? What should I do if I forget to take my insulin? What number should I call if I have questions? Where to find more information American Diabetes Association (ADA): www.diabetes.org Association of Diabetes Care and Education Specialists (ADCES): www.diabeteseducator.org Summary Before you give yourself an insulin injection, be sure to  wash your hands for at least 20 seconds and test your blood glucose level. Write down that number. Check the expiration date and the type of insulin that is in the pen. The type of insulin that you take may determine how many injections you give yourself and when you need to give the injections. It is best to inject insulin into the same body area each time; for example, always in the abdomen. However, you should use a different spot in  that area for each injection. Do not use a needle more than one time. This information is not intended to replace advice given to you by your health care provider. Make sure you discuss any questions you have with your health care provider. Document Revised: 09/22/2020 Document Reviewed: 05/03/2020 Elsevier Patient Education  2024 [employer].

## 2023-02-28 ENCOUNTER — Ambulatory Visit: Payer: BC Managed Care – PPO | Admitting: Family Medicine

## 2023-02-28 VITALS — BP 128/70 | HR 69 | Temp 98.1°F | Ht 75.0 in | Wt 257.2 lb

## 2023-02-28 DIAGNOSIS — Z794 Long term (current) use of insulin: Secondary | ICD-10-CM

## 2023-02-28 DIAGNOSIS — Z7984 Long term (current) use of oral hypoglycemic drugs: Secondary | ICD-10-CM

## 2023-02-28 DIAGNOSIS — D696 Thrombocytopenia, unspecified: Secondary | ICD-10-CM

## 2023-02-28 DIAGNOSIS — E871 Hypo-osmolality and hyponatremia: Secondary | ICD-10-CM

## 2023-02-28 DIAGNOSIS — E1165 Type 2 diabetes mellitus with hyperglycemia: Secondary | ICD-10-CM

## 2023-02-28 NOTE — Progress Notes (Signed)
Subjective:  Patient ID: Nathaniel Velazquez, male    DOB: 18-Dec-1960  Age: 62 y.o. MRN: [id number]  CC:  Chief Complaint  Patient presents with   Medical Management of Chronic Issues    Pt doing okay notes glucose has been better but he still doesn't feel 100%    HPI Nathaniel Velazquez presents for   Diabetes: Previously diet controlled worsening control recently, recent ER visit with significant elevation recently, initial ketones in urine but not seen on visit in office with me Friday, initial outpatient treatment planned with ER precautions.  He was started on Lantus 10 units/day with increases by 2 units every 2 days until readings below 200.  Also started on metformin 500 mg initially daily, with plan for twice daily dosing after 1 week.  Taking metformin 500mg  with lunch, no GI side effects.  Lantus - 5pm, on 12u past 2 days.   Feeling better, not 100%. Late in afternoon feels tired, slightly shaky - blood sugars not low - 250-270. Home readings fasting: 242, 308 Postprandial: 272, 257, 374.  No low readings.  No n/v,abd pain.  No new vision symptoms. Drinking fluids.   Will be traveling to [place] on Friday 8/23.   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   Body mass index (BMI) 33.0-33.9, adult 09/19/2018   Family history of cardiovascular disease 09/19/2018   Moderate alcohol consumption 09/19/2018   Psoriasis 09/19/2018   Psoriatic arthritis (HCC) 09/19/2018   Stiffness of right shoulder joint 08/19/2017   Traumatic complete tear of rotator  cuff 08/04/2017   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   Dysfunction of right eustachian tube 11/18/2015   Anal fissure 07/22/2015   Hypertensive disorder 07/03/2015   Obesity    Hyperlipidemia 11/21/2008   PALPITATIONS 10/30/2008   Past Medical History:  Diagnosis Date   Arthritis    Cancer (HCC)    skin cancer   GERD (gastroesophageal reflux disease)    Hypertension    Obesity    Steroid injection in rt knee   Prediabetes 03/01/2022   hgA1c was 6.1, checks cbg at home, no meds at this time   Past Surgical History:  Procedure Laterality Date   ANAL FISTULECTOMY  1987   APPENDECTOMY     COLONOSCOPY  04/08/2021   KNEE SURGERY Bilateral    meniscal tear repairs   POLYPECTOMY     ROTATOR CUFF REPAIR Bilateral 2018   TONSILLECTOMY     UPPER GASTROINTESTINAL ENDOSCOPY     Allergies  Allergen Reactions   Gabapentin Other (See Comments)    Made very jittery   Prior to Admission medications   Medication Sig Start Date End Date Taking? Authorizing Provider  ACCU-CHEK GUIDE test strip 3 times per day - fasting or 2 hours after meal. 02/24/23  Yes Shade Flood, MD  Accu-Chek Softclix Lancets lancets SMARTSIG:2 Topical Twice Daily 10/15/21  Yes [provider]  benzonatate (TESSALON) 100 MG capsule Take 1 capsule (100 mg total) by mouth  3 (three) times daily as needed for cough. 07/14/22  Yes Shade Flood, MD  blood glucose meter kit and supplies Dispense based on patient and insurance preference. Use up to four times daily as directed. (FOR ICD-10 E10.9, E11.9).once per day fasting or 2 hours after meals. 10/15/21  Yes Shade Flood, MD  clobetasol ointment (TEMOVATE) 0.05 % as needed. As directed 05/15/12  Yes [provider]  finasteride (PROSCAR) 5 MG tablet Take 5 mg by mouth daily. 11/19/21  Yes [provider]  guaiFENesin-codeine 100-10 MG/5ML syrup Take 5-10 mLs by mouth at bedtime as needed for cough. 07/14/22  Yes Shade Flood, MD  insulin glargine (LANTUS SOLOSTAR) 100 UNIT/ML Solostar Pen Inject 10 Units into the skin daily. 02/24/23  Yes Shade Flood, MD  Insulin Pen Needle (NOVOFINE) 30G X 8 MM MISC Inject 10 each into the skin as needed. 02/24/23  Yes Shade Flood, MD  lisinopril (ZESTRIL) 20 MG tablet TAKE 1 TABLET BY MOUTH DAILY 02/09/23  Yes Shade Flood, MD  metFORMIN (GLUCOPHAGE) 500 MG tablet Take 1 tablet (500 mg total) by mouth 2 (two) times daily with a meal. Start every day for initial week. 02/24/23  Yes Shade Flood, MD  metoprolol succinate (TOPROL-XL) 25 MG 24 hr tablet TAKE 1 TABLET BY MOUTH DAILY 02/09/23  Yes Shade Flood, MD  pantoprazole (PROTONIX) 40 MG tablet Take 1 tablet (40 mg total) by mouth daily. 03/08/22  Yes Pyrtle, Carie Caddy, MD  psyllium (METAMUCIL) 58.6 % powder Take 1 packet by mouth 2 (two) times daily.    Yes [provider]  sildenafil (REVATIO) 20 MG tablet TAKE 1 TO 3 TABLETS BY MOUTH DAILY AS NEEDED 01/10/23  Yes Shade Flood, MD  SKYRIZI PEN 150 MG/ML pen Inject into the skin. 12/20/22  Yes [provider]  tamsulosin (FLOMAX) 0.4 MG CAPS capsule Take 0.4 mg by mouth daily. 11/19/21  Yes [provider]  valACYclovir (VALTREX) 500 MG tablet Take by mouth as needed. 02/23/21  Yes [provider]  ENBREL MINI 50 MG/ML SOCT INSERT MINI CARTRIDGE INTO AUTOINJECTOR AND INJECT UNDER THE SKIN EVERY 7 DAYS. 06/05/20   Gearldine Bienenstock, PA-C   Social History   Socioeconomic History   Marital status: Married    Spouse name: Not on file   Number of children: Not on file   Years of education: Not on file   Highest education level: Bachelor's degree (e.g., BA, AB, BS)  Occupational History   Not on file  Tobacco Use   Smoking status: Never   Smokeless tobacco: Never  Vaping Use   Vaping status: Never Used  Substance and Sexual Activity   Alcohol use: Not Currently    Comment: occ   Drug use: Never   Sexual activity: Yes   Other Topics Concern   Not on file  Social History Narrative   Sales rep   Social Determinants of Health   Financial Resource Strain: Low Risk  (02/28/2023)   Overall Financial Resource Strain (CARDIA)    Difficulty of Paying Living Expenses: Not hard at all  Food Insecurity: No Food Insecurity (02/28/2023)   Hunger Vital Sign    Worried About Running Out of Food in the Last Year: Never true    Ran Out of Food in the Last Year: Never true  Transportation Needs: No Transportation Needs (02/28/2023)   PRAPARE - Administrator, Civil Service (Medical): No  Lack of Transportation (Non-Medical): No  Physical Activity: Unknown (02/28/2023)   Exercise Vital Sign    Days of Exercise per Week: 3 days    Minutes of Exercise per Session: Not on file  Stress: No Stress Concern Present (02/28/2023)   [employer] of Occupational Health - Occupational Stress Questionnaire    Feeling of Stress : Only a little  Social Connections: Unknown (02/28/2023)   Social Connection and Isolation Panel [NHANES]    Frequency of Communication with Friends and Family: Patient declined    Frequency of Social Gatherings with Friends and Family: Patient declined    Attends Religious Services: Patient declined    Database administrator or Organizations: No    Attends Engineer, structural: Not on file    Marital Status: Married  Catering manager Violence: Not on file    Review of Systems Per HPI.   Objective:   Vitals:   02/28/23 1151  BP: 128/70  Pulse: 69  Temp: 98.1 F (36.7 C)  TempSrc: Temporal  SpO2: 97%  Weight: 257 lb 3.2 oz (116.7 kg)  Height: 6\' 3"  (1.905 m)     Physical Exam Vitals reviewed.  Constitutional:      Appearance: He is well-developed.  HENT:     Head: Normocephalic and atraumatic.  Neck:     Vascular: No carotid bruit or JVD.  Cardiovascular:     Rate and Rhythm: Normal rate and regular rhythm.     Heart sounds: Normal heart sounds. No murmur  heard. Pulmonary:     Effort: Pulmonary effort is normal.     Breath sounds: Normal breath sounds. No rales.  Musculoskeletal:     Right lower leg: No edema.     Left lower leg: No edema.  Skin:    General: Skin is warm and dry.  Neurological:     Mental Status: He is alert and oriented to person, place, and time.  Psychiatric:        Mood and Affect: Mood normal.        Assessment & Plan:  Nathaniel Velazquez is a 62 y.o. male . Type 2 diabetes mellitus with hyperglycemia, unspecified whether long term insulin use (HCC) Normal bicarb on labs from Friday, improving.  Readings in the 200s.  Will change timing of insulin around 9 AM, and increase to 14 units tomorrow.  Tolerating metformin, will try twice daily dosing the next few days.  Potential side effects discussed.  Increase insulin dose another 2 units in 2 days if still over 200 and then again in 2 to 3 days if needed.  Recheck office visit in 1 week.  RTC precautions and hypoglycemia precautions discussed.  Previous elevated LFTs, can recheck labs next visit as well as his platelets although no new bleeding and overall stable from prior readings.  Mild hyponatremia with sodium 130 corrected based on glucose Friday. 1 week follow-up with ER/RTC precautions if new or worsening symptoms sooner.  No orders of the defined types were placed in this encounter.  Patient Instructions  Glad to hear that things are improving.  Okay to increase the metformin to twice per day in the next few days.  If any stomach upset, diarrhea or problems with that dosing can return to 1/day. Starting tomorrow morning, increase insulin to 14 units, then increase by additional 2 units in 2 days if blood sugars still remain above 200.  Can be increased by another 2 units in another 2 to 3 days if  still above 200.  Follow-up with me in 1 week.  We will recheck blood work at that time.  Let me know if there are questions in the meantime.     Signed,    Meredith Staggers, MD [facility], [facility] 02/28/23 12:46 PM

## 2023-02-28 NOTE — Patient Instructions (Signed)
Glad to hear that things are improving.  Okay to increase the metformin to twice per day in the next few days.  If any stomach upset, diarrhea or problems with that dosing can return to 1/day. Starting tomorrow morning, increase insulin to 14 units, then increase by additional 2 units in 2 days if blood sugars still remain above 200.  Can be increased by another 2 units in another 2 to 3 days if still above 200.  Follow-up with me in 1 week.  We will recheck blood work at that time.  Let me know if there are questions in the meantime.

## 2023-03-02 ENCOUNTER — Other Ambulatory Visit: Payer: Self-pay | Admitting: Internal Medicine

## 2023-03-07 ENCOUNTER — Other Ambulatory Visit (INDEPENDENT_AMBULATORY_CARE_PROVIDER_SITE_OTHER): Payer: BC Managed Care – PPO

## 2023-03-07 ENCOUNTER — Ambulatory Visit: Payer: BC Managed Care – PPO | Admitting: Family Medicine

## 2023-03-07 ENCOUNTER — Encounter: Payer: Self-pay | Admitting: Family Medicine

## 2023-03-07 VITALS — BP 128/76 | HR 71 | Temp 98.3°F | Ht 75.0 in | Wt 261.6 lb

## 2023-03-07 DIAGNOSIS — E1165 Type 2 diabetes mellitus with hyperglycemia: Secondary | ICD-10-CM | POA: Diagnosis not present

## 2023-03-07 DIAGNOSIS — K219 Gastro-esophageal reflux disease without esophagitis: Secondary | ICD-10-CM | POA: Diagnosis not present

## 2023-03-07 DIAGNOSIS — Z794 Long term (current) use of insulin: Secondary | ICD-10-CM

## 2023-03-07 MED ORDER — PANTOPRAZOLE SODIUM 40 MG PO TBEC
40.0000 mg | DELAYED_RELEASE_TABLET | Freq: Every day | ORAL | 3 refills | Status: DC
Start: 2023-03-07 — End: 2024-02-27

## 2023-03-07 NOTE — Patient Instructions (Addendum)
Stay at 20 units Lantus for now, metformin same dose for now, and follow-up with me after your trip.  If you do have questions during the trip, I am happy to answer those over MyChart.  Make sure to have quick acting sugar with you in case your blood sugar does drop below as we discussed.  Please have labs drawn at the [facility] location below in the next day or 2 if possible.  Let me know if there are questions and have a safe trip.   [facility] or xray: Walk in 8:30-4:30 during weekdays, no appointment needed [address]  [place] [address], [place] [address]   Type 2 Diabetes Mellitus, Self-Care, Adult Caring for yourself after you have been diagnosed with type 2 diabetes (type 2 diabetes mellitus) means keeping your blood sugar (glucose) under control with a balance of: Nutrition. Exercise. Lifestyle changes. Medicines or insulin, if needed. Support from your team of health care providers and others. What are the risks? Having type 2 diabetes can put you at risk for other long-term (chronic) conditions, such as heart disease and kidney disease. Your health care provider may prescribe medicines to help prevent complications from diabetes. How to monitor your blood glucose  Check your blood glucose every day or as often as told by your health care provider. Have your A1C (hemoglobin A1C) level checked two or more times a year, or as often as told by your health care provider. Your health care provider will set personalized treatment goals for you. Generally, the goal of treatment is to maintain the following blood glucose levels: Before meals: 80-130 mg/dL (4.1-3.2 mmol/L). After meals: below 180 mg/dL (10 mmol/L). A1C level: less than 7%. How to manage hyperglycemia and hypoglycemia Hyperglycemia symptoms Hyperglycemia, also called high blood glucose, occurs when blood glucose is too high. Make sure you know the early signs of hyperglycemia, such as: Increased thirst. Hunger. Feeling  very tired. Needing to urinate more often than usual. Blurry vision. Hypoglycemia symptoms Hypoglycemia, also called low blood glucose, occurs with a blood glucose level at or below 70 mg/dL (3.9 mmol/L). Diabetes medicines lower your blood glucose and can cause hypoglycemia. The risk for hypoglycemia increases during or after exercise, during sleep, during illness, and when skipping meals or not eating for a long time (fasting). It is important to know the symptoms of hypoglycemia and treat it right away. Always have a 15-gram rapid-acting carbohydrate snack with you to treat low blood glucose. Family members and close friends should also know the symptoms and understand how to treat hypoglycemia, in case you are not able to treat yourself. Symptoms may include: Hunger. Anxiety. Sweating and feeling clammy. Dizziness or feeling light-headed. Sleepiness. Increased heart rate. Irritability. Tingling or numbness around the mouth, lips, or tongue. Restless sleep. Severe hypoglycemia is when your blood glucose level is at or below 54 mg/dL (3 mmol/L). Severe hypoglycemia is an emergency. Do not wait to see if the symptoms will go away. Get medical help right away. Call your local emergency services (911 in the [place] Do not drive yourself to the hospital. If you have severe hypoglycemia and you cannot eat or drink, you may need glucagon. A family member or close friend should learn how to check your blood glucose and how to give you glucagon. Ask your health care provider if you need to have an emergency glucagon kit available. Follow these instructions at home: Medicines Take prescribed insulin or diabetes medicines as told by your health care  provider. Do not run out of insulin or other diabetes medicines. Plan ahead so you always have these available. If you use insulin, adjust your dosage based on your physical activity and what foods you eat. Your health care provider will tell you how to  adjust your dosage. Take over-the-counter and prescription medicines only as told by your health care provider. Eating and drinking  What you eat and drink affects your blood glucose and your insulin dosage. Making good choices helps to control your diabetes and prevent other health problems. A healthy meal plan includes eating lean proteins, complex carbohydrates, fresh fruits and vegetables, low-fat dairy products, and healthy fats. Make an appointment to see a registered dietitian to help you create an eating plan that is right for you. Make sure that you: Follow instructions from your health care provider about eating or drinking restrictions. Drink enough fluid to keep your urine pale yellow. Keep a record of the carbohydrates that you eat. Do this by reading food labels and learning the standard serving sizes of foods. Follow your sick-day plan whenever you cannot eat or drink as usual. Make this plan in advance with your health care provider.  Activity Stay active. Exercise regularly, as told by your health care provider. This may include: Stretching and doing strength exercises, such as yoga or weight lifting, two or more times a week. Doing 150 minutes or more of moderate-intensity or vigorous-intensity exercise each week. This could be brisk walking, biking, or water aerobics. Spread out your activity over 3 or more days of the week. Do not go more than 2 days in a row without doing some kind of physical activity. When you start a new exercise or activity, work with your health care provider to adjust your insulin, medicines, or food intake as needed. Lifestyle Do not use any products that contain nicotine or tobacco. These products include cigarettes, chewing tobacco, and vaping devices, such as e-cigarettes. If you need help quitting, ask your health care provider. If you drink alcohol and your health care provider says that it is safe for you: Limit how much you have to: 0-1 drink  a day for women who are not pregnant. 0-2 drinks a day for men. Know how much alcohol is in your drink. In the [place] one drink equals one 12 oz bottle of beer (355 mL), one 5 oz glass of wine (148 mL), or one 1 oz glass of hard liquor (44 mL). Learn to manage stress. If you need help with this, ask your health care provider. Take care of your body  Keep your immunizations up to date. In addition to getting vaccinations as told by your health care provider, it is recommended that you get vaccinated against the following illnesses: The flu (influenza). Get a flu shot every year. Pneumonia. Hepatitis B. Schedule an eye exam soon after your diagnosis, and then one time every year after that. Check your skin and feet every day for cuts, bruises, redness, blisters, or sores. Schedule a foot exam with your health care provider once every year. Brush your teeth and gums two times a day, and floss one or more times a day. Visit your dentist one or more times every 6 months. Maintain a healthy weight. General instructions Share your diabetes management plan with people in your workplace, school, and household. Carry a medical alert card or wear medical alert jewelry. Keep all follow-up visits. This is important. Questions to ask your health care provider Should I meet with a  certified diabetes care and education specialist? Where can I find a support group for people with diabetes? Where to find more information For help and guidance and for more information about diabetes, please visit: American Diabetes Association (ADA): www.diabetes.org American Association of Diabetes Care and Education Specialists (ADCES): www.diabeteseducator.org International Diabetes Federation (IDF): DCOnly.dk Summary Caring for yourself after you have been diagnosed with type 2 diabetes (type 2 diabetes mellitus) means keeping your blood sugar (glucose) under control with a balance of nutrition, exercise, lifestyle  changes, and medicine. Check your blood glucose every day, as often as told by your health care provider. Having diabetes can put you at risk for other long-term (chronic) conditions, such as heart disease and kidney disease. Your health care provider may prescribe medicines to help prevent complications from diabetes. Share your diabetes management plan with people in your workplace, school, and household. Keep all follow-up visits. This is important. This information is not intended to replace advice given to you by your health care provider. Make sure you discuss any questions you have with your health care provider. Document Revised: 12/03/2020 Document Reviewed: 12/03/2020 Elsevier Patient Education  2024 [employer].

## 2023-03-07 NOTE — Progress Notes (Signed)
Subjective:  Patient ID: Nathaniel Velazquez, male    DOB: [birth year]  Age: 62 y.o. MRN: [id number]  CC:  Chief Complaint  Patient presents with   Medical Management of Chronic Issues    Pt needs refill pantoprazole, was filled by Dr Rhea Belton     HPI Nathaniel Velazquez presents for   Diabetes: Complicated by hyperglycemia.  Previous diet control with worsening control, with significant hyperglycemia prompting ER visit.  See last 2 visits, most recently 1 week ago.  He was started on Lantus August 8.  12 units/day and his last visit.  Symptomatically was improving.  Still having readings in the 200-300 range.  Was tolerating metformin 500 mg daily, advised to increase to twice daily dosing - no GI side effects. Increased to 14 units initially with an additional 2 units after 2 days if still having elevated readings.  Current dose lantus: 20 units starting today.  Home readings.  Fasting  = 176, 184 past 2 days, 206 this morning.  Home readings postprandial: 225, 200.  Feels ok. No symptomatic lows.   Plan for urine microalbumin today. Ophthalmology: in past year.  Needs refill of Protonix, takes for GERD, followed by Dr. Rhea Belton previously.  Will be traveling to [place] in 4 days - will be there 8 nights.    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   Body mass index (BMI) 33.0-33.9, adult 09/19/2018   Family history of cardiovascular disease 09/19/2018   Moderate alcohol consumption 09/19/2018   Psoriasis 09/19/2018   Psoriatic arthritis (HCC) 09/19/2018    Stiffness of right shoulder joint 08/19/2017   Traumatic complete tear of rotator cuff 08/04/2017   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   Dysfunction of right eustachian tube 11/18/2015   Anal fissure 07/22/2015   Hypertensive disorder 07/03/2015   Obesity    Hyperlipidemia 11/21/2008   PALPITATIONS 10/30/2008   Past Medical History:  Diagnosis Date   Arthritis    Cancer (HCC)    skin cancer   GERD (gastroesophageal reflux disease)    Hypertension    Obesity    Steroid injection in rt knee   Prediabetes 03/01/2022   hgA1c was 6.1, checks cbg at home, no meds at this time   Past Surgical History:  Procedure Laterality Date   ANAL FISTULECTOMY  1987   APPENDECTOMY     COLONOSCOPY  04/08/2021   KNEE SURGERY Bilateral    meniscal tear repairs   POLYPECTOMY     ROTATOR CUFF REPAIR Bilateral 2018   TONSILLECTOMY     UPPER GASTROINTESTINAL ENDOSCOPY     Allergies  Allergen Reactions   Gabapentin Other (See Comments)    Made very jittery   Prior to Admission medications   Medication Sig Start Date End Date Taking? Authorizing Provider  ACCU-CHEK GUIDE test strip 3 times per day - fasting or 2 hours after meal. 02/24/23  Yes Shade Flood, MD  Accu-Chek Softclix Lancets lancets SMARTSIG:2 Topical Twice Daily 10/15/21  Yes [provider]  blood glucose meter kit and supplies Dispense based on patient and insurance preference. Use up to four times daily as directed. (FOR ICD-10 E10.9, E11.9).once per day fasting or 2 hours after meals. 10/15/21  Yes Shade Flood, MD  clobetasol ointment (TEMOVATE) 0.05 % as needed. As directed 05/15/12  Yes [provider]  finasteride (PROSCAR) 5 MG tablet Take 5 mg by mouth daily. 11/19/21  Yes [provider]  guaiFENesin-codeine 100-10 MG/5ML syrup Take 5-10 mLs by mouth at bedtime as needed for cough. 07/14/22  Yes Shade Flood, MD  insulin glargine (LANTUS SOLOSTAR) 100 UNIT/ML Solostar Pen  Inject 10 Units into the skin daily. 02/24/23  Yes Shade Flood, MD  Insulin Pen Needle (NOVOFINE) 30G X 8 MM MISC Inject 10 each into the skin as needed. 02/24/23  Yes Shade Flood, MD  lisinopril (ZESTRIL) 20 MG tablet TAKE 1 TABLET BY MOUTH DAILY 02/09/23  Yes Shade Flood, MD  metFORMIN (GLUCOPHAGE) 500 MG tablet Take 1 tablet (500 mg total) by mouth 2 (two) times daily with a meal. Start every day for initial week. 02/24/23  Yes Shade Flood, MD  metoprolol succinate (TOPROL-XL) 25 MG 24 hr tablet TAKE 1 TABLET BY MOUTH DAILY 02/09/23  Yes Shade Flood, MD  pantoprazole (PROTONIX) 40 MG tablet Take 1 tablet (40 mg total) by mouth daily. 03/08/22  Yes Pyrtle, Carie Caddy, MD  psyllium (METAMUCIL) 58.6 % powder Take 1 packet by mouth 2 (two) times daily.    Yes [provider]  sildenafil (REVATIO) 20 MG tablet TAKE 1 TO 3 TABLETS BY MOUTH DAILY AS NEEDED 01/10/23  Yes Shade Flood, MD  SKYRIZI PEN 150 MG/ML pen Inject into the skin. 12/20/22  Yes [provider]  tamsulosin (FLOMAX) 0.4 MG CAPS capsule Take 0.4 mg by mouth daily. 11/19/21  Yes [provider]  valACYclovir (VALTREX) 500 MG tablet Take by mouth as needed. 02/23/21  Yes [provider]   Social History   Socioeconomic History   Marital status: Married    Spouse name: Not on file   Number of children: Not on file   Years of education: Not on file   Highest education level: Bachelor's degree (e.g., BA, AB, BS)  Occupational History   Not on file  Tobacco Use   Smoking status: Never   Smokeless tobacco: Never  Vaping Use   Vaping status: Never Used  Substance and Sexual Activity   Alcohol use: Not Currently    Comment: occ   Drug use: Never   Sexual activity: Yes  Other Topics Concern   Not on file  Social History Narrative   Sales rep   Social Determinants of Health   Financial Resource Strain: Low Risk  (02/28/2023)   Overall Financial Resource Strain (CARDIA)     Difficulty of Paying Living Expenses: Not hard at all  Food Insecurity: No Food Insecurity (02/28/2023)   Hunger Vital Sign    Worried About Running Out of Food in the Last Year: Never true    Ran Out of Food in the Last Year: Never true  Transportation Needs: No Transportation Needs (02/28/2023)   PRAPARE - Administrator, Civil Service (Medical): No    Lack of Transportation (Non-Medical): No  Physical Activity: Unknown (02/28/2023)   Exercise Vital Sign    Days of Exercise per Week: 3 days    Minutes of Exercise per Session: Not on file  Stress:  No Stress Concern Present (02/28/2023)   [employer] of Occupational Health - Occupational Stress Questionnaire    Feeling of Stress : Only a little  Social Connections: Unknown (02/28/2023)   Social Connection and Isolation Panel [NHANES]    Frequency of Communication with Friends and Family: Patient declined    Frequency of Social Gatherings with Friends and Family: Patient declined    Attends Religious Services: Patient declined    Database administrator or Organizations: No    Attends Engineer, structural: Not on file    Marital Status: Married  Catering manager Violence: Not on file    Review of Systems Per HPI  Objective:   Vitals:   03/07/23 1321  BP: 128/76  Pulse: 71  Temp: 98.3 F (36.8 C)  TempSrc: Temporal  SpO2: 97%  Weight: 261 lb 9.6 oz (118.7 kg)  Height: 6\' 3"  (1.905 m)    Physical Exam Vitals reviewed.  Constitutional:      Appearance: He is well-developed.  HENT:     Head: Normocephalic and atraumatic.  Neck:     Vascular: No carotid bruit or JVD.  Cardiovascular:     Rate and Rhythm: Normal rate and regular rhythm.     Heart sounds: Normal heart sounds. No murmur heard. Pulmonary:     Effort: Pulmonary effort is normal.     Breath sounds: Normal breath sounds. No rales.  Musculoskeletal:     Right lower leg: No edema.     Left lower leg: No edema.  Skin:    General: Skin  is warm and dry.  Neurological:     Mental Status: He is alert and oriented to person, place, and time.  Psychiatric:        Mood and Affect: Mood normal.        Assessment & Plan:  Nathaniel Velazquez is a 62 y.o. male . Type 2 diabetes mellitus with hyperglycemia, unspecified whether long term insulin use (HCC) - Plan: Basic metabolic panel, Microalbumin / creatinine urine ratio, CANCELED: Basic metabolic panel, CANCELED: Microalbumin / creatinine urine ratio  -Updated BMP ordered with prior hyponatremia, minimal with correction for hyperglycemia.  Improving glycemic control, recently increased to 20 units Lantus.  Continue same dose for now and as long as he is not having any relative lows okay to stay at that same dose while in [place].  Hypoglycemia prevention and treatment discussed.  Tolerating current dose metformin, will continue same.  Follow-up once he returns from out of the country.  Letter provided for his diabetic supplies with traveling.  Gastroesophageal reflux disease, unspecified whether esophagitis present - Plan: pantoprazole (PROTONIX) 40 MG tablet  -Stable PPI, refilled.  Meds ordered this encounter  Medications   pantoprazole (PROTONIX) 40 MG tablet    Sig: Take 1 tablet (40 mg total) by mouth daily.    Dispense:  90 tablet    Refill:  3   Patient Instructions  Stay at 20 units Lantus for now, metformin same dose for now, and follow-up with me after your trip.  If you do have questions during the trip, I am happy to answer those over MyChart.  Make sure to have quick acting sugar with you in case your blood sugar does drop below as we discussed.  Please have labs drawn at the [facility] location below in the next day or 2 if possible.  Let me know if there are questions and have a safe trip.   [employer] or  xray: Walk in 8:30-4:30 during weekdays, no appointment needed [address]  [place], [place] [address]   Type 2 Diabetes Mellitus, Self-Care,  Adult Caring for yourself after you have been diagnosed with type 2 diabetes (type 2 diabetes mellitus) means keeping your blood sugar (glucose) under control with a balance of: Nutrition. Exercise. Lifestyle changes. Medicines or insulin, if needed. Support from your team of health care providers and others. What are the risks? Having type 2 diabetes can put you at risk for other long-term (chronic) conditions, such as heart disease and kidney disease. Your health care provider may prescribe medicines to help prevent complications from diabetes. How to monitor your blood glucose  Check your blood glucose every day or as often as told by your health care provider. Have your A1C (hemoglobin A1C) level checked two or more times a year, or as often as told by your health care provider. Your health care provider will set personalized treatment goals for you. Generally, the goal of treatment is to maintain the following blood glucose levels: Before meals: 80-130 mg/dL (6.2-1.3 mmol/L). After meals: below 180 mg/dL (10 mmol/L). A1C level: less than 7%. How to manage hyperglycemia and hypoglycemia Hyperglycemia symptoms Hyperglycemia, also called high blood glucose, occurs when blood glucose is too high. Make sure you know the early signs of hyperglycemia, such as: Increased thirst. Hunger. Feeling very tired. Needing to urinate more often than usual. Blurry vision. Hypoglycemia symptoms Hypoglycemia, also called low blood glucose, occurs with a blood glucose level at or below 70 mg/dL (3.9 mmol/L). Diabetes medicines lower your blood glucose and can cause hypoglycemia. The risk for hypoglycemia increases during or after exercise, during sleep, during illness, and when skipping meals or not eating for a long time (fasting). It is important to know the symptoms of hypoglycemia and treat it right away. Always have a 15-gram rapid-acting carbohydrate snack with you to treat low blood glucose. Family  members and close friends should also know the symptoms and understand how to treat hypoglycemia, in case you are not able to treat yourself. Symptoms may include: Hunger. Anxiety. Sweating and feeling clammy. Dizziness or feeling light-headed. Sleepiness. Increased heart rate. Irritability. Tingling or numbness around the mouth, lips, or tongue. Restless sleep. Severe hypoglycemia is when your blood glucose level is at or below 54 mg/dL (3 mmol/L). Severe hypoglycemia is an emergency. Do not wait to see if the symptoms will go away. Get medical help right away. Call your local emergency services (911 in the [place] Do not drive yourself to the hospital. If you have severe hypoglycemia and you cannot eat or drink, you may need glucagon. A family member or close friend should learn how to check your blood glucose and how to give you glucagon. Ask your health care provider if you need to have an emergency glucagon kit available. Follow these instructions at home: Medicines Take prescribed insulin or diabetes medicines as told by your health care provider. Do not run out of insulin or other diabetes medicines. Plan ahead so you always have these available. If you use insulin, adjust your dosage based on your physical activity and what foods you eat. Your health care provider will tell you how to adjust your dosage. Take over-the-counter and prescription medicines only as told by your health care provider. Eating and drinking  What you eat and drink affects your blood glucose and your insulin dosage. Making good choices helps to control your diabetes and prevent other health problems. A healthy meal  plan includes eating lean proteins, complex carbohydrates, fresh fruits and vegetables, low-fat dairy products, and healthy fats. Make an appointment to see a registered dietitian to help you create an eating plan that is right for you. Make sure that you: Follow instructions from your health care  provider about eating or drinking restrictions. Drink enough fluid to keep your urine pale yellow. Keep a record of the carbohydrates that you eat. Do this by reading food labels and learning the standard serving sizes of foods. Follow your sick-day plan whenever you cannot eat or drink as usual. Make this plan in advance with your health care provider.  Activity Stay active. Exercise regularly, as told by your health care provider. This may include: Stretching and doing strength exercises, such as yoga or weight lifting, two or more times a week. Doing 150 minutes or more of moderate-intensity or vigorous-intensity exercise each week. This could be brisk walking, biking, or water aerobics. Spread out your activity over 3 or more days of the week. Do not go more than 2 days in a row without doing some kind of physical activity. When you start a new exercise or activity, work with your health care provider to adjust your insulin, medicines, or food intake as needed. Lifestyle Do not use any products that contain nicotine or tobacco. These products include cigarettes, chewing tobacco, and vaping devices, such as e-cigarettes. If you need help quitting, ask your health care provider. If you drink alcohol and your health care provider says that it is safe for you: Limit how much you have to: 0-1 drink a day for women who are not pregnant. 0-2 drinks a day for men. Know how much alcohol is in your drink. In the [place] one drink equals one 12 oz bottle of beer (355 mL), one 5 oz glass of wine (148 mL), or one 1 oz glass of hard liquor (44 mL). Learn to manage stress. If you need help with this, ask your health care provider. Take care of your body  Keep your immunizations up to date. In addition to getting vaccinations as told by your health care provider, it is recommended that you get vaccinated against the following illnesses: The flu (influenza). Get a flu shot every year. Pneumonia. Hepatitis  B. Schedule an eye exam soon after your diagnosis, and then one time every year after that. Check your skin and feet every day for cuts, bruises, redness, blisters, or sores. Schedule a foot exam with your health care provider once every year. Brush your teeth and gums two times a day, and floss one or more times a day. Visit your dentist one or more times every 6 months. Maintain a healthy weight. General instructions Share your diabetes management plan with people in your workplace, school, and household. Carry a medical alert card or wear medical alert jewelry. Keep all follow-up visits. This is important. Questions to ask your health care provider Should I meet with a certified diabetes care and education specialist? Where can I find a support group for people with diabetes? Where to find more information For help and guidance and for more information about diabetes, please visit: American Diabetes Association (ADA): www.diabetes.org American Association of Diabetes Care and Education Specialists (ADCES): www.diabeteseducator.org International Diabetes Federation (IDF): DCOnly.dk Summary Caring for yourself after you have been diagnosed with type 2 diabetes (type 2 diabetes mellitus) means keeping your blood sugar (glucose) under control with a balance of nutrition, exercise, lifestyle changes, and medicine. Check your blood glucose every  day, as often as told by your health care provider. Having diabetes can put you at risk for other long-term (chronic) conditions, such as heart disease and kidney disease. Your health care provider may prescribe medicines to help prevent complications from diabetes. Share your diabetes management plan with people in your workplace, school, and household. Keep all follow-up visits. This is important. This information is not intended to replace advice given to you by your health care provider. Make sure you discuss any questions you have with your health  care provider. Document Revised: 12/03/2020 Document Reviewed: 12/03/2020 Elsevier Patient Education  2024 Elsevier Inc.     Signed,   Meredith Staggers, MD [facility], [facility] 03/07/23 1:42 PM

## 2023-03-08 LAB — BASIC METABOLIC PANEL
BUN: 19 mg/dL (ref 6–23)
CO2: 22 meq/L (ref 19–32)
Calcium: 10 mg/dL (ref 8.4–10.5)
Chloride: 97 meq/L (ref 96–112)
Creatinine, Ser: 0.83 mg/dL (ref 0.40–1.50)
GFR: 93.4 mL/min (ref >60.00)
Glucose, Bld: 247 mg/dL — ABNORMAL HIGH (ref 70–99)
Potassium: 4.7 mEq/L (ref 3.5–5.1)
Sodium: 131 meq/L — ABNORMAL LOW (ref 135–145)

## 2023-03-08 LAB — MICROALBUMIN / CREATININE URINE RATIO
Creatinine,U: 99.2 mg/dL
Microalb Creat Ratio: 0.7 mg/g (ref 0.0–30.0)
Microalb, Ur: <0.7 mg/dL (ref 0.0–1.9)

## 2023-04-04 ENCOUNTER — Encounter: Payer: Self-pay | Admitting: Family Medicine

## 2023-04-06 ENCOUNTER — Ambulatory Visit: Payer: BC Managed Care – PPO | Admitting: Family Medicine

## 2023-04-07 ENCOUNTER — Encounter: Payer: Self-pay | Admitting: Family Medicine

## 2023-04-07 ENCOUNTER — Ambulatory Visit: Payer: BC Managed Care – PPO | Admitting: Family Medicine

## 2023-04-07 VITALS — BP 136/88 | HR 63 | Temp 97.2°F | Wt 262.2 lb

## 2023-04-07 DIAGNOSIS — E1165 Type 2 diabetes mellitus with hyperglycemia: Secondary | ICD-10-CM

## 2023-04-07 DIAGNOSIS — Z794 Long term (current) use of insulin: Secondary | ICD-10-CM | POA: Diagnosis not present

## 2023-04-07 DIAGNOSIS — Z7984 Long term (current) use of oral hypoglycemic drugs: Secondary | ICD-10-CM

## 2023-04-07 NOTE — Progress Notes (Signed)
Subjective:  Patient ID: Nathaniel Velazquez, male    DOB: 11/27/1960  Age: 62 y.o. MRN: [id number]  CC:  Chief Complaint  Patient presents with   Medical Management of Chronic Issues    No other questions/concerns.     HPI Nathaniel Velazquez presents for   Diabetes:  With hyperglycemia, recent diet controlled acute worsening control prompting ER visit, see last few visits.  He was started on Lantus in early August and adjusting doses since that time, along with metformin 500 mg twice daily. 20 units/day as of his August 19 visit.  Trip to [place] since that time. Good trip to [place] - no issues.  Still on 20u lantus, metformin twice per day - no new side effects.  Fasting: low of 104 in [place]. Usual fasting mid 130's. No symptomatic lows. Postprandial: usually 150-160, outlier 223 yesterday. Sometimes feels a little woozy with activity, rare - not readings at that time.  Avoiding carbs usually.  Microalbumin: normal on 8/19.  Optho, foot exam, pneumovax: plans to schedule optho visit.  Flu vaccine - defers for a few weeks. Plans on covid vaccine at that time.   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   Body mass index (BMI) 33.0-33.9, adult 09/19/2018   Family history of cardiovascular disease 09/19/2018   Moderate alcohol consumption 09/19/2018   Psoriasis 09/19/2018   Psoriatic arthritis (HCC) 09/19/2018   Stiffness of right shoulder joint 08/19/2017   Traumatic complete tear of rotator cuff 08/04/2017   Conductive  hearing loss in right ear 11/18/2015   Dysfunction of right eustachian tube 11/18/2015   Anal fissure 07/22/2015   Hypertensive disorder 07/03/2015   Obesity    Hyperlipidemia 11/21/2008   PALPITATIONS 10/30/2008   Past Medical History:  Diagnosis Date   Arthritis    Cancer (HCC)    skin cancer   GERD (gastroesophageal reflux disease)    Hypertension    Obesity    Steroid injection in rt knee   Prediabetes 03/01/2022   hgA1c was 6.1, checks cbg at home, no meds at this time   Past Surgical History:  Procedure Laterality Date   ANAL FISTULECTOMY  1987   APPENDECTOMY     COLONOSCOPY  04/08/2021   KNEE SURGERY Bilateral    meniscal tear repairs   POLYPECTOMY     ROTATOR CUFF REPAIR Bilateral 2018   TONSILLECTOMY     UPPER GASTROINTESTINAL ENDOSCOPY     Allergies  Allergen Reactions   Gabapentin Other (See Comments)    Made very jittery   Prior to Admission medications   Medication Sig Start Date End Date Taking? Authorizing Provider  ACCU-CHEK GUIDE test strip 3 times per day - fasting or 2 hours after meal. 02/24/23  Yes Shade Flood, MD  Accu-Chek Softclix Lancets lancets SMARTSIG:2 Topical Twice Daily 10/15/21  Yes [provider]  blood glucose meter kit and supplies Dispense based on patient and insurance preference. Use up to four times  daily as directed. (FOR ICD-10 E10.9, E11.9).once per day fasting or 2 hours after meals. 10/15/21  Yes Shade Flood, MD  clobetasol ointment (TEMOVATE) 0.05 % as needed. As directed 05/15/12  Yes [provider]  finasteride (PROSCAR) 5 MG tablet Take 5 mg by mouth daily. 11/19/21  Yes [provider]  guaiFENesin-codeine 100-10 MG/5ML syrup Take 5-10 mLs by mouth at bedtime as needed for cough. 07/14/22  Yes Shade Flood, MD  insulin glargine (LANTUS SOLOSTAR) 100 UNIT/ML Solostar Pen Inject 10 Units into the skin daily. 02/24/23  Yes Shade Flood, MD  Insulin Pen Needle (NOVOFINE) 30G X 8 MM  MISC Inject 10 each into the skin as needed. 02/24/23  Yes Shade Flood, MD  lisinopril (ZESTRIL) 20 MG tablet TAKE 1 TABLET BY MOUTH DAILY 02/09/23  Yes Shade Flood, MD  metFORMIN (GLUCOPHAGE) 500 MG tablet Take 1 tablet (500 mg total) by mouth 2 (two) times daily with a meal. Start every day for initial week. 02/24/23  Yes Shade Flood, MD  metoprolol succinate (TOPROL-XL) 25 MG 24 hr tablet TAKE 1 TABLET BY MOUTH DAILY 02/09/23  Yes Shade Flood, MD  pantoprazole (PROTONIX) 40 MG tablet Take 1 tablet (40 mg total) by mouth daily. 03/07/23  Yes Shade Flood, MD  psyllium (METAMUCIL) 58.6 % powder Take 1 packet by mouth 2 (two) times daily.    Yes [provider]  sildenafil (REVATIO) 20 MG tablet TAKE 1 TO 3 TABLETS BY MOUTH DAILY AS NEEDED 01/10/23  Yes Shade Flood, MD  SKYRIZI PEN 150 MG/ML pen Inject into the skin. 12/20/22  Yes [provider]  tamsulosin (FLOMAX) 0.4 MG CAPS capsule Take 0.4 mg by mouth daily. 11/19/21  Yes [provider]  valACYclovir (VALTREX) 500 MG tablet Take by mouth as needed. 02/23/21  Yes [provider]   Social History   Socioeconomic History   Marital status: Married    Spouse name: Not on file   Number of children: Not on file   Years of education: Not on file   Highest education level: Bachelor's degree (e.g., BA, AB, BS)  Occupational History   Not on file  Tobacco Use   Smoking status: Never   Smokeless tobacco: Never  Vaping Use   Vaping status: Never Used  Substance and Sexual Activity   Alcohol use: Not Currently    Comment: occ   Drug use: Never   Sexual activity: Yes  Other Topics Concern   Not on file  Social History Narrative   Sales rep   Social Determinants of Health   Financial Resource Strain: Low Risk  (02/28/2023)   Overall Financial Resource Strain (CARDIA)    Difficulty of Paying Living Expenses: Not hard at all  Food Insecurity: No Food Insecurity (02/28/2023)    Hunger Vital Sign    Worried About Running Out of Food in the Last Year: Never true    Ran Out of Food in the Last Year: Never true  Transportation Needs: No Transportation Needs (02/28/2023)   PRAPARE - Administrator, Civil Service (Medical): No    Lack of Transportation (Non-Medical): No  Physical Activity: Unknown (02/28/2023)   Exercise Vital Sign    Days of Exercise per Week: 3 days    Minutes of Exercise per Session: Not on file  Stress: No Stress Concern Present (02/28/2023)   [employer] of Occupational Health - Occupational Stress Questionnaire    Feeling of  Stress : Only a little  Social Connections: Unknown (02/28/2023)   Social Connection and Isolation Panel [NHANES]    Frequency of Communication with Friends and Family: Patient declined    Frequency of Social Gatherings with Friends and Family: Patient declined    Attends Religious Services: Patient declined    Database administrator or Organizations: No    Attends Engineer, structural: Not on file    Marital Status: Married  Catering manager Violence: Not on file    Review of Systems Per HPI  Objective:   Vitals:   04/07/23 1402  BP: 136/88  Pulse: 63  Temp: (!) 97.2 F (36.2 C)  SpO2: 98%  Weight: 262 lb 3.2 oz (118.9 kg)     Physical Exam Vitals reviewed.  Constitutional:      Appearance: He is well-developed.  HENT:     Head: Normocephalic and atraumatic.  Neck:     Vascular: No carotid bruit or JVD.  Cardiovascular:     Rate and Rhythm: Normal rate and regular rhythm.     Heart sounds: Normal heart sounds. No murmur heard. Pulmonary:     Effort: Pulmonary effort is normal.     Breath sounds: Normal breath sounds. No rales.  Musculoskeletal:     Right lower leg: No edema.     Left lower leg: No edema.  Skin:    General: Skin is warm and dry.  Neurological:     Mental Status: He is alert and oriented to person, place, and time.  Psychiatric:        Mood and Affect:  Mood normal.        Assessment & Plan:  Nathaniel Velazquez is a 62 y.o. male . Type 2 diabetes mellitus with hyperglycemia, unspecified whether long term insulin use (HCC)  -Outside labs pending from recent rheumatology visit.  Hold on labs at this time.  Based on home readings will continue same med regimen but advised to monitor for any low blood sugar symptoms when he is feeling any potential symptoms as above.  If he does have lows will decrease insulin by at least 2 units but continue metformin same dose for now.  94-month follow-up with lipid panel, A1c at that time.  RTC precautions if worsening control sooner.  No orders of the defined types were placed in this encounter.  Patient Instructions  Glad the [place] trip went well.  Home readings are doing great.  I would not increase any medications at this time and if you continue to have episodes of feeling woozy make sure to check your blood sugar at those times and if any low readings we should decrease the insulin dose by at least 2 units/day.  Let me know if that occurs.  No change in metformin dose for now.  I will look at the labs from your rheumatologist, but we can plan on lipid panel and other labs including your A1c at follow-up in 2 months.  Let me know if there are any questions in the meantime and thanks for coming in today.     Signed,   Meredith Staggers, MD  Primary Care, [facility] 04/07/23 10:21 PM

## 2023-04-07 NOTE — Patient Instructions (Addendum)
Glad the [place] trip went well.  Home readings are doing great.  I would not increase any medications at this time and if you continue to have episodes of feeling woozy make sure to check your blood sugar at those times and if any low readings we should decrease the insulin dose by at least 2 units/day.  Let me know if that occurs.  No change in metformin dose for now.  I will look at the labs from your rheumatologist, but we can plan on lipid panel and other labs including your A1c at follow-up in 2 months.  Let me know if there are any questions in the meantime and thanks for coming in today.

## 2023-04-08 ENCOUNTER — Other Ambulatory Visit: Payer: Self-pay | Admitting: Family Medicine

## 2023-04-08 LAB — LAB REPORT - SCANNED: EGFR: 87

## 2023-04-12 ENCOUNTER — Other Ambulatory Visit: Payer: Self-pay

## 2023-04-12 DIAGNOSIS — E1165 Type 2 diabetes mellitus with hyperglycemia: Secondary | ICD-10-CM

## 2023-04-12 MED ORDER — ACCU-CHEK SOFTCLIX LANCETS MISC
1 refills | Status: DC
Start: 2023-04-12 — End: 2023-11-02

## 2023-04-25 ENCOUNTER — Encounter: Payer: Self-pay | Admitting: Family Medicine

## 2023-04-26 ENCOUNTER — Other Ambulatory Visit: Payer: Self-pay

## 2023-04-26 DIAGNOSIS — E1165 Type 2 diabetes mellitus with hyperglycemia: Secondary | ICD-10-CM

## 2023-04-26 MED ORDER — LANTUS SOLOSTAR 100 UNIT/ML ~~LOC~~ SOPN
20.0000 [IU] | PEN_INJECTOR | Freq: Every day | SUBCUTANEOUS | 3 refills | Status: DC
Start: 2023-04-26 — End: 2024-05-07

## 2023-05-06 ENCOUNTER — Other Ambulatory Visit (HOSPITAL_COMMUNITY): Payer: Self-pay

## 2023-06-21 ENCOUNTER — Encounter: Payer: Self-pay | Admitting: Family Medicine

## 2023-06-21 DIAGNOSIS — C4491 Basal cell carcinoma of skin, unspecified: Secondary | ICD-10-CM | POA: Insufficient documentation

## 2023-06-30 ENCOUNTER — Encounter: Payer: Self-pay | Admitting: Family Medicine

## 2023-06-30 IMAGING — US US ABDOMEN LIMITED
1 series · 14 of 25 positions shown · non-contrast
Comparison: Abdominal ultrasound 03/19/2016

CLINICAL DATA: Abnormal LFTs

EXAM:
ULTRASOUND ABDOMEN LIMITED RIGHT UPPER QUADRANT

[Series 1: us abdomen limited ruq (liver/gb) · 14 of 53 slices shown]
[im 1/53]
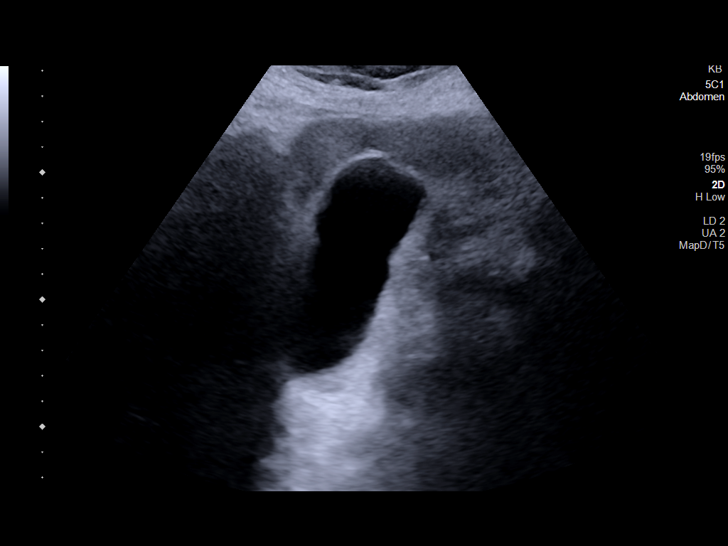
[im 5/53]
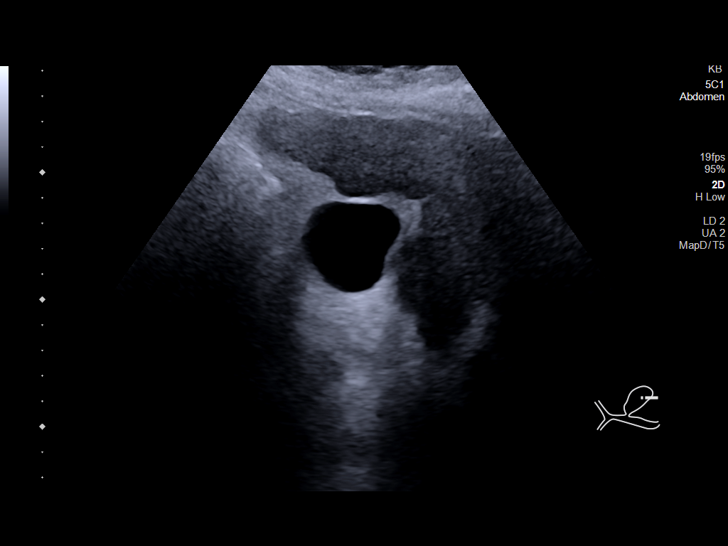
[im 9/53]
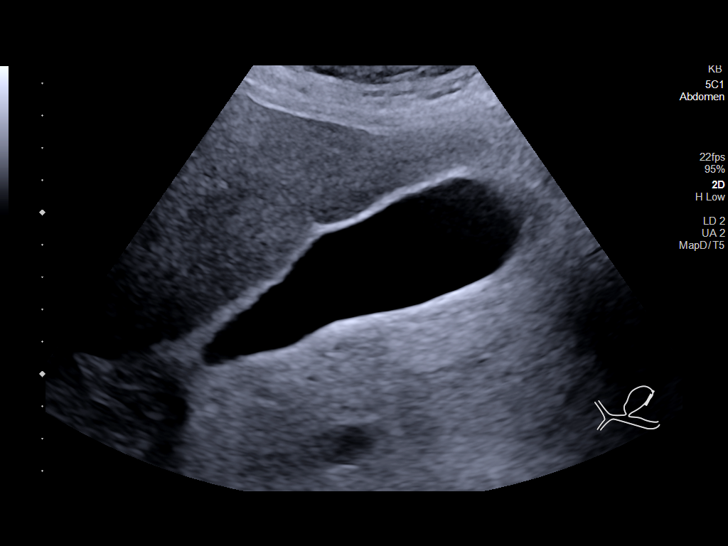
[im 14/53]
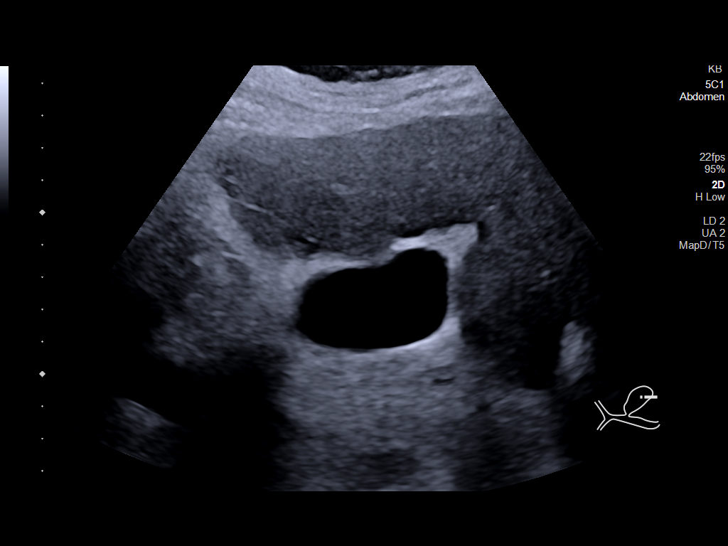
[im 18/53]
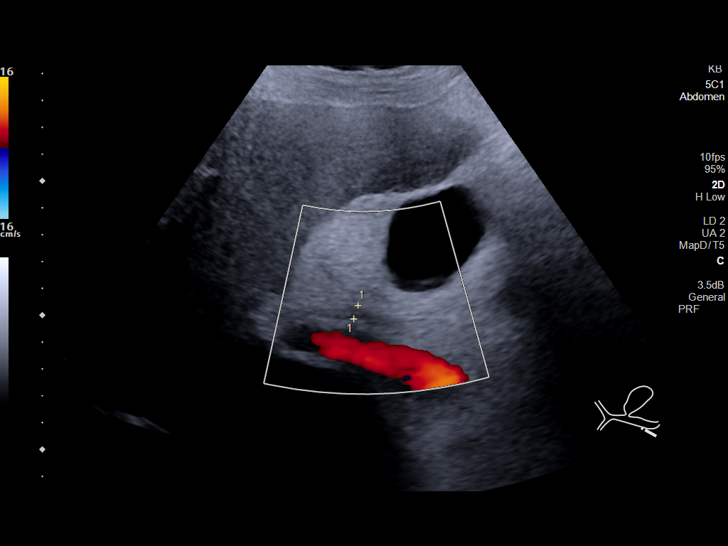
[im 20/53]
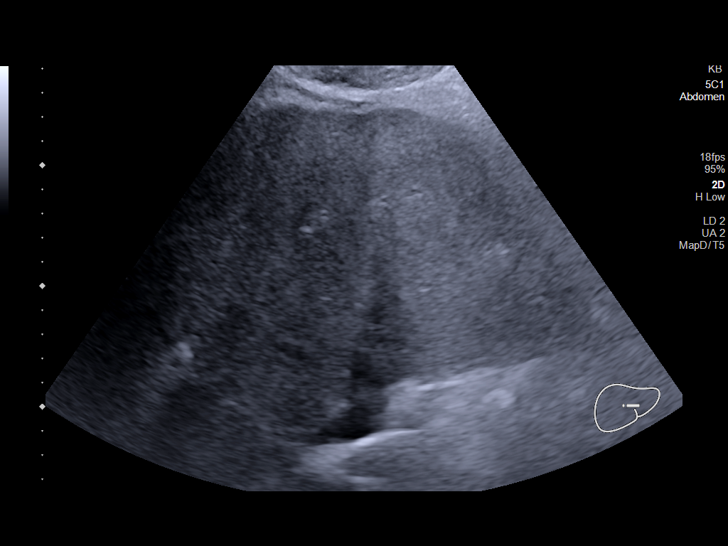
[im 24/53]
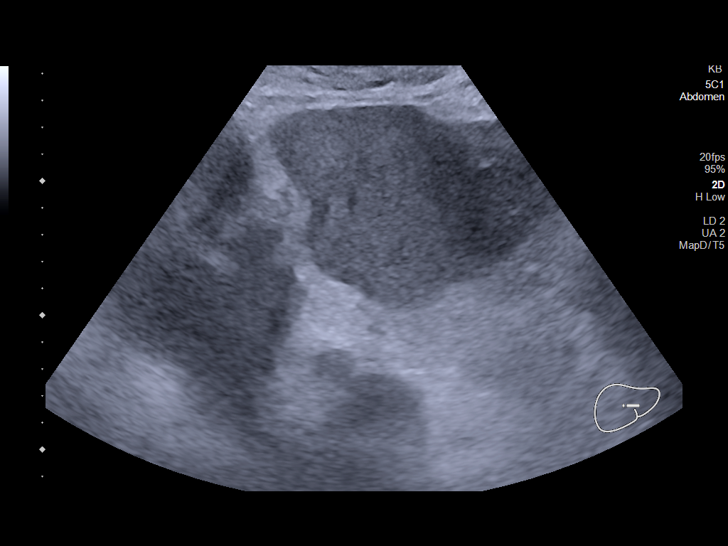
[im 29/53]
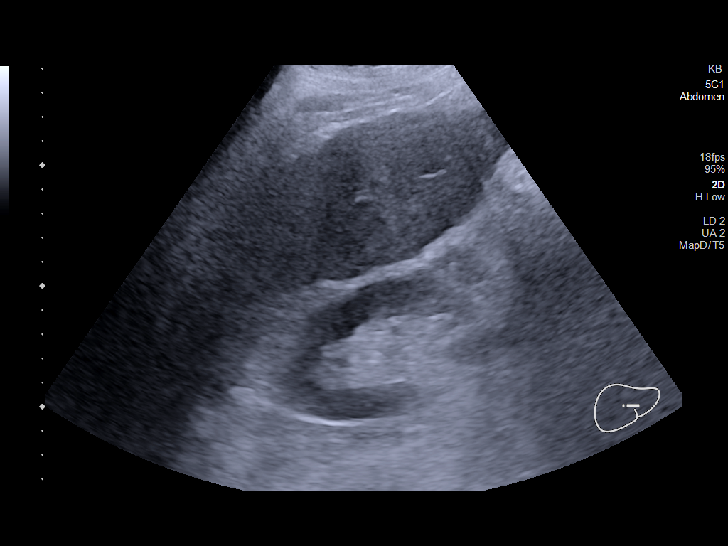
[im 33/53]
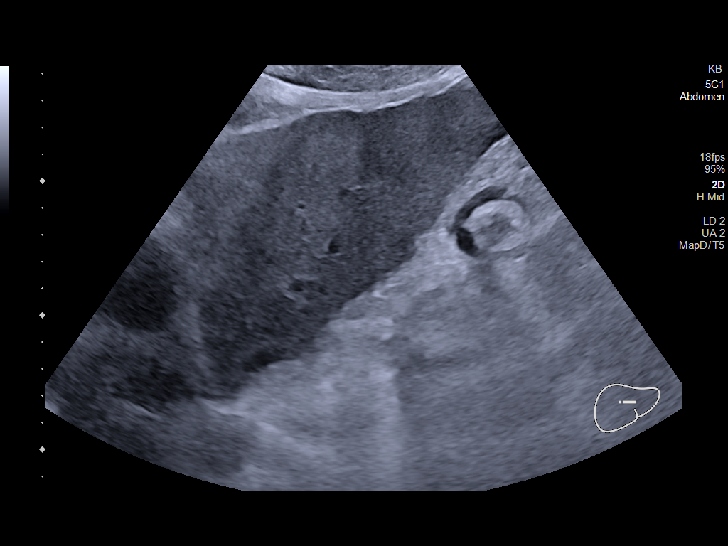
[im 35/53]
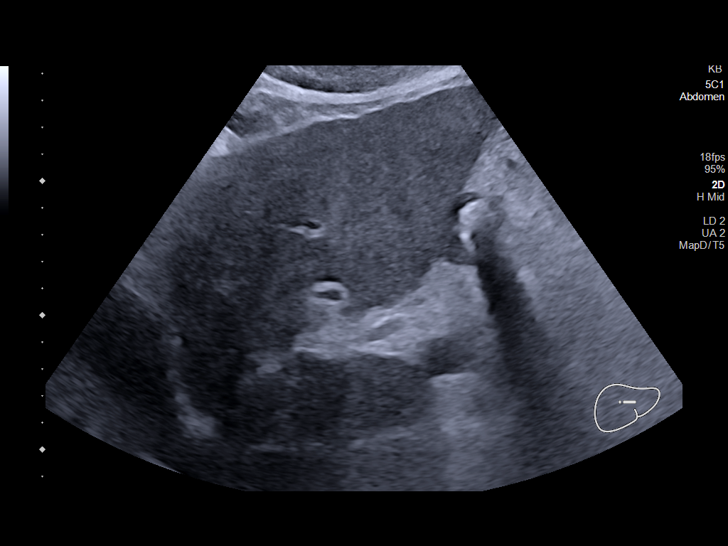
[im 40/53]
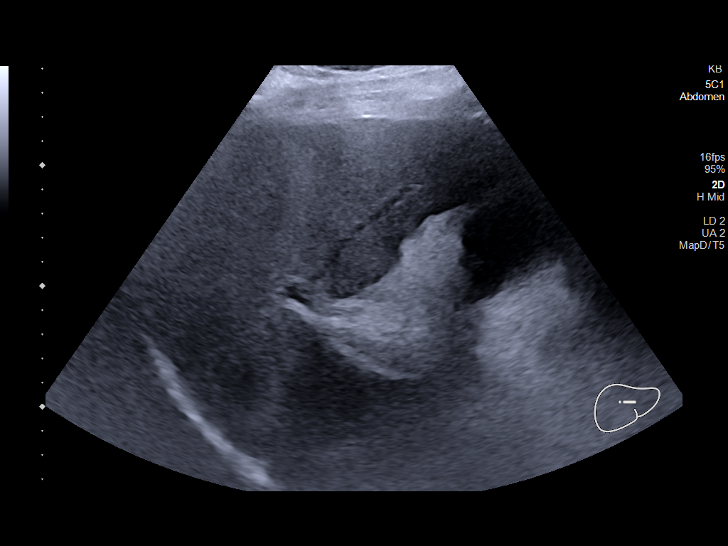
[im 44/53]
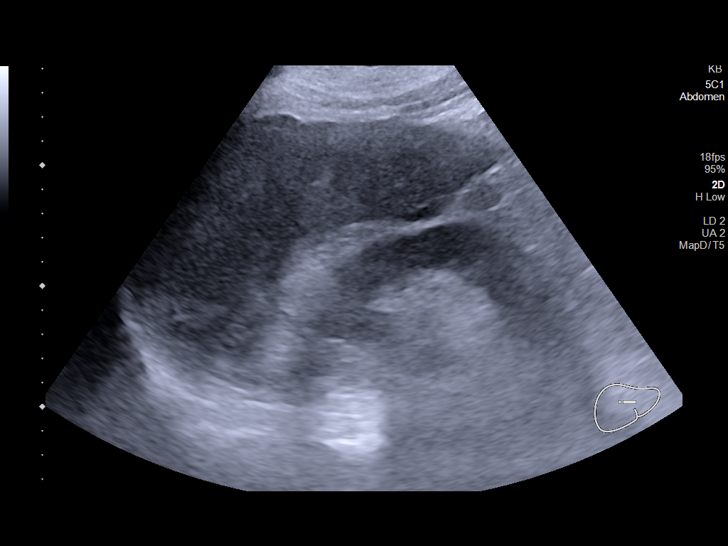
[im 48/53]
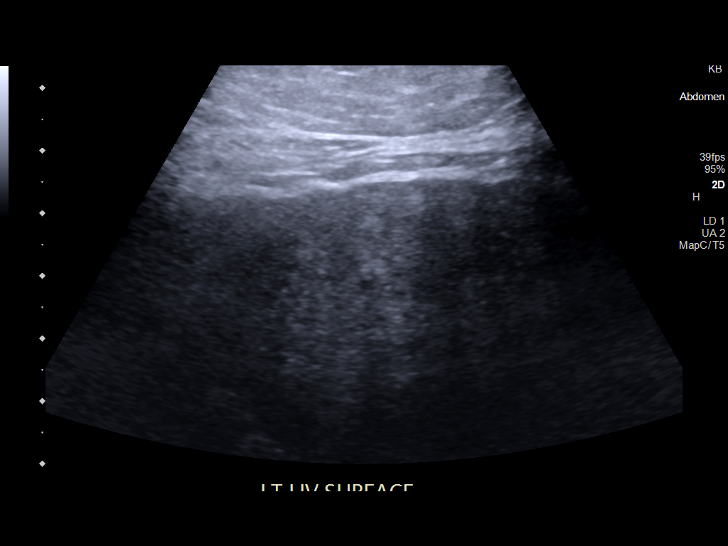
[im 53/53]
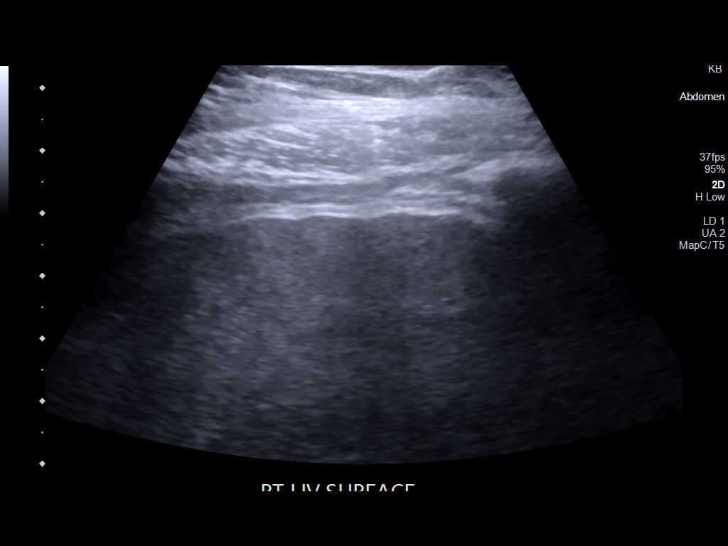

[14 of 25 positions shown; findings below may reference images not displayed]

FINDINGS: Gallbladder:

No gallstones or wall thickening visualized. No sonographic Murphy
sign noted by sonographer.

Common bile duct:

Diameter: 5 mm

Liver:

Inhomogeneous parenchyma with nodular contour. No focal mass
identified. Portal vein is patent on color Doppler imaging with
normal direction of blood flow towards the liver.

Other: Trace ascites.
IMPRESSION: Evidence of hepatic cirrhosis with no focal hepatic mass identified.
Trace ascites.

## 2023-07-01 NOTE — Telephone Encounter (Signed)
Called pt and left message for him to call and schedule appt

## 2023-08-01 ENCOUNTER — Encounter: Payer: Self-pay | Admitting: Family Medicine

## 2023-08-01 NOTE — Telephone Encounter (Signed)
 Pt asking what labs you want done so rheumatology can draw them with their labs   Please advise

## 2023-08-02 ENCOUNTER — Other Ambulatory Visit: Payer: Self-pay | Admitting: Family Medicine

## 2023-08-02 DIAGNOSIS — I10 Essential (primary) hypertension: Secondary | ICD-10-CM

## 2023-08-05 ENCOUNTER — Telehealth: Payer: Self-pay

## 2023-08-05 DIAGNOSIS — I10 Essential (primary) hypertension: Secondary | ICD-10-CM

## 2023-08-05 MED ORDER — LISINOPRIL 20 MG PO TABS
20.0000 mg | ORAL_TABLET | Freq: Every day | ORAL | 1 refills | Status: DC
Start: 2023-08-05 — End: 2023-11-28

## 2023-08-05 NOTE — Telephone Encounter (Signed)
Copied from CRM [id number]. Topic: Clinical - Prescription Issue >> Aug 05, 2023  8:45 AM Theodis Sato wrote: Reason for CRM: Patient states his pharmacy is waiting on a approval for lisinopril (ZESTRIL) 20 MG tablet - patient is requesting Dr. Chilton Si to please approve this medication re-fill request as after today he will be out of this medication.

## 2023-08-05 NOTE — Telephone Encounter (Signed)
Sent refill to [facility] since patient will be out soon, called and LM informing pt this has been sent to [facility] and if he wants it sent elsewhere he can call and let us know

## 2023-10-26 ENCOUNTER — Encounter: Payer: Self-pay | Admitting: Family Medicine

## 2023-10-26 ENCOUNTER — Ambulatory Visit: Admitting: Family Medicine

## 2023-10-26 VITALS — BP 128/78 | HR 64 | Temp 97.9°F | Ht 75.0 in | Wt 271.4 lb

## 2023-10-26 DIAGNOSIS — E1165 Type 2 diabetes mellitus with hyperglycemia: Secondary | ICD-10-CM | POA: Diagnosis not present

## 2023-10-26 DIAGNOSIS — Z7984 Long term (current) use of oral hypoglycemic drugs: Secondary | ICD-10-CM

## 2023-10-26 DIAGNOSIS — Z794 Long term (current) use of insulin: Secondary | ICD-10-CM

## 2023-10-26 LAB — POCT GLYCOSYLATED HEMOGLOBIN (HGB A1C): Hemoglobin A1C: 5.1 % (ref 4.0–5.6)

## 2023-10-26 NOTE — Patient Instructions (Signed)
 Congratulations on the much improved hemoglobin A1c!  Lets decrease the Lantus insulin to 10 units for now and recheck in 1 month.  No other med changes at this time.  If you notice a significant increase in readings let me know but otherwise we will recheck in 1 month with repeat labs at that time including cholesterol and electrolytes. Take care!

## 2023-10-26 NOTE — Progress Notes (Signed)
 Subjective:  Patient ID: Nathaniel Velazquez, male    DOB: 1961-01-09  Age: 63 y.o. MRN: [id number]  CC:  Chief Complaint  Patient presents with   Medical Management of Chronic Issues    Pt denies questions or concerns, rheum did labs and those are in Media from 09/26/23 and A1c in office today 5.1     HPI [name] presents for   Diabetes: Prior diet control, then worsening control, ER visit last year, hyperglycemia at that time, started on Lantus in early August along with metformin twice per day.  September visit, 20 units daily Lantus at that time along with metformin.  Improved glycemic control. Still taking lantus 20units per day. Metfomin 500mg  BID.  Exercising, watching diet. Low carb, no sweets.   On ACE-I.  Semi-retired.   Home readings: 97-115 No sx lows. Lowest reading 91.  Denies hypoglycemia symptoms. Rare dizziness if standing up quickly. Longstanding symptoms.  Rheumatology labs from 09/26/2023: Hemoglobin 13.7, WBC 4.2, normal.  Platelets mildly low at 93.  No aspirin products, no bleeding/bruising. No meds changes per rheum.  No abd pain, n/v, or unexplained weight loss.  Microalbumin: Normal March 07, 2023.  Wt Readings from Last 3 Encounters:  10/26/23 271 lb 6.4 oz (123.1 kg)  04/07/23 262 lb 3.2 oz (118.9 kg)  03/07/23 261 lb 9.6 oz (118.7 kg)    Lab Results  Component Value Date   HGBA1C 5.1 10/26/2023   HGBA1C 11.9 (H) 02/24/2023   HGBA1C 6.1 (A) 01/14/2022   Lab Results  Component Value Date   MICROALBUR <0.7 03/07/2023   LDLCALC 68 11/30/2019   CREATININE 0.83 03/07/2023    History Patient Active Problem List   Diagnosis Date Noted   Basal cell carcinoma (BCC) 06/21/2023   Pain in joint of right ankle 06/22/2021   Perianal ulcer (HCC) 12/02/2020   High risk medication use 01/14/2020   Pain in left knee 06/28/2019   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   Body mass index (BMI) 33.0-33.9, adult 09/19/2018   Family history of  cardiovascular disease 09/19/2018   Moderate alcohol consumption 09/19/2018   Psoriasis 09/19/2018   Psoriatic arthritis (HCC) 09/19/2018   Stiffness of right shoulder joint 08/19/2017   Traumatic complete tear of rotator cuff 08/04/2017   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   Dysfunction of right eustachian tube 11/18/2015   Anal fissure 07/22/2015   Hypertensive disorder 07/03/2015   Obesity    Hyperlipidemia 11/21/2008   PALPITATIONS 10/30/2008   Past Medical History:  Diagnosis Date   Arthritis    Cancer (HCC)    skin cancer   GERD (gastroesophageal reflux disease)    Hypertension    Obesity    Steroid injection in rt knee   Prediabetes 03/01/2022   hgA1c was 6.1, checks cbg at home, no meds at this time   Past Surgical History:  Procedure Laterality Date   ANAL FISTULECTOMY  1987   APPENDECTOMY     COLONOSCOPY  04/08/2021   KNEE SURGERY Bilateral    meniscal tear repairs   POLYPECTOMY     ROTATOR CUFF REPAIR Bilateral 2018   TONSILLECTOMY     UPPER GASTROINTESTINAL ENDOSCOPY     Allergies  Allergen Reactions   Gabapentin Other (See Comments)    Made very jittery   Prior to Admission medications   Medication Sig Start Date End Date Taking? Authorizing Provider  ACCU-CHEK GUIDE test strip 3 times per day - fasting or 2 hours after  meal. 02/24/23  Yes Shade Flood, MD  Accu-Chek Softclix Lancets lancets Use as instructed 04/12/23  Yes Shade Flood, MD  blood glucose meter kit and supplies Dispense based on patient and insurance preference. Use up to four times daily as directed. (FOR ICD-10 E10.9, E11.9).once per day fasting or 2 hours after meals. 10/15/21  Yes Shade Flood, MD  clobetasol ointment (TEMOVATE) 0.05 % as needed. As directed 05/15/12  Yes [provider]  finasteride (PROSCAR) 5 MG tablet Take 5 mg by mouth daily. 11/19/21  Yes [provider]  golimumab (SIMPONI ARIA) 50 MG/4ML SOLN injection Inject 50 mg into  the vein every 8 (eight) weeks.   Yes [provider]  guaiFENesin-codeine 100-10 MG/5ML syrup Take 5-10 mLs by mouth at bedtime as needed for cough. 07/14/22  Yes Shade Flood, MD  insulin glargine (LANTUS SOLOSTAR) 100 UNIT/ML Solostar Pen Inject 20 Units into the skin daily. 04/26/23  Yes Shade Flood, MD  Insulin Pen Needle (NOVOFINE) 30G X 8 MM MISC Inject 10 each into the skin as needed. 02/24/23  Yes Shade Flood, MD  lisinopril (ZESTRIL) 20 MG tablet Take 1 tablet (20 mg total) by mouth daily. 08/05/23  Yes Shade Flood, MD  metFORMIN (GLUCOPHAGE) 500 MG tablet Take 1 tablet (500 mg total) by mouth 2 (two) times daily with a meal. Start every day for initial week. 02/24/23  Yes Shade Flood, MD  metoprolol succinate (TOPROL-XL) 25 MG 24 hr tablet TAKE 1 TABLET BY MOUTH DAILY 08/03/23  Yes Shade Flood, MD  pantoprazole (PROTONIX) 40 MG tablet Take 1 tablet (40 mg total) by mouth daily. 03/07/23  Yes Shade Flood, MD  psyllium (METAMUCIL) 58.6 % powder Take 1 packet by mouth 2 (two) times daily.    Yes [provider]  sildenafil (REVATIO) 20 MG tablet TAKE 1 TO 3 TABLETS BY MOUTH DAILY AS NEEDED 01/10/23  Yes Shade Flood, MD  SKYRIZI PEN 150 MG/ML pen Inject into the skin. 12/20/22  Yes [provider]  tamsulosin (FLOMAX) 0.4 MG CAPS capsule Take 0.4 mg by mouth daily. 11/19/21  Yes [provider]  valACYclovir (VALTREX) 500 MG tablet Take by mouth as needed. 02/23/21  Yes [provider]   Social History   Socioeconomic History   Marital status: Married    Spouse name: Not on file   Number of children: Not on file   Years of education: Not on file   Highest education level: Bachelor's degree (e.g., BA, AB, BS)  Occupational History   Not on file  Tobacco Use   Smoking status: Never   Smokeless tobacco: Never  Vaping Use   Vaping status: Never Used  Substance and Sexual Activity   Alcohol use: Not Currently     Comment: occ   Drug use: Never   Sexual activity: Yes  Other Topics Concern   Not on file  Social History Narrative   Sales rep   Social Drivers of Health   Financial Resource Strain: Low Risk  (02/28/2023)   Overall Financial Resource Strain (CARDIA)    Difficulty of Paying Living Expenses: Not hard at all  Food Insecurity: No Food Insecurity (02/28/2023)   Hunger Vital Sign    Worried About Running Out of Food in the Last Year: Never true    Ran Out of Food in the Last Year: Never true  Transportation Needs: No Transportation Needs (02/28/2023)   PRAPARE - Transportation  Lack of Transportation (Medical): No    Lack of Transportation (Non-Medical): No  Physical Activity: Unknown (02/28/2023)   Exercise Vital Sign    Days of Exercise per Week: 3 days    Minutes of Exercise per Session: Not on file  Stress: No Stress Concern Present (02/28/2023)   [employer] of Occupational Health - Occupational Stress Questionnaire    Feeling of Stress : Only a little  Social Connections: Unknown (02/28/2023)   Social Connection and Isolation Panel [NHANES]    Frequency of Communication with Friends and Family: Patient declined    Frequency of Social Gatherings with Friends and Family: Patient declined    Attends Religious Services: Patient declined    Database administrator or Organizations: No    Attends Engineer, structural: Not on file    Marital Status: Married  Catering manager Violence: Not on file    Review of Systems  Constitutional:  Negative for fatigue and unexpected weight change.  Eyes:  Negative for visual disturbance.  Respiratory:  Negative for cough, chest tightness and shortness of breath.   Cardiovascular:  Negative for chest pain, palpitations and leg swelling.  Gastrointestinal:  Negative for abdominal pain and blood in stool.  Neurological:  Negative for dizziness, light-headedness and headaches.     Objective:   Vitals:   10/26/23 1605  BP:  128/78  Pulse: 64  Temp: 97.9 F (36.6 C)  TempSrc: Temporal  SpO2: 97%  Weight: 271 lb 6.4 oz (123.1 kg)  Height: 6\' 3"  (1.905 m)     Physical Exam Vitals reviewed.  Constitutional:      Appearance: He is well-developed.  HENT:     Head: Normocephalic and atraumatic.  Neck:     Vascular: No carotid bruit or JVD.  Cardiovascular:     Rate and Rhythm: Normal rate and regular rhythm.     Heart sounds: Normal heart sounds. No murmur heard. Pulmonary:     Effort: Pulmonary effort is normal.     Breath sounds: Normal breath sounds. No rales.  Musculoskeletal:     Right lower leg: No edema.     Left lower leg: No edema.  Skin:    General: Skin is warm and dry.  Neurological:     Mental Status: He is alert and oriented to person, place, and time.  Psychiatric:        Mood and Affect: Mood normal.        Assessment & Plan:  AMBROSE WILE is a 64 y.o. male . Type 2 diabetes mellitus with hyperglycemia, unspecified whether long term insulin use (HCC) - Plan: POCT glycosylated hemoglobin (Hb A1C) Impressive improvement in A1c, commended on diet changes, exercise.  Although he denies any hypoglycemia I do think we should try to cut back on insulin doses as time initially decreasing from 20 units to 10 units.  Okay to continue same dose of metformin for now, 1 month recheck and can check other labs at that time.  MyChart message or contact me otherwise if significant increase in readings in the interim.  Understanding expressed.  No orders of the defined types were placed in this encounter.  Patient Instructions  Congratulations on the much improved hemoglobin A1c!  Lets decrease the Lantus insulin to 10 units for now and recheck in 1 month.  No other med changes at this time.  If you notice a significant increase in readings let me know but otherwise we will recheck in 1 month with repeat  labs at that time including cholesterol and electrolytes. Take care!    Signed,    Meredith Staggers, MD [facility] 10/26/23 10:23 PM

## 2023-11-02 ENCOUNTER — Other Ambulatory Visit: Payer: Self-pay | Admitting: Family Medicine

## 2023-11-02 DIAGNOSIS — E1165 Type 2 diabetes mellitus with hyperglycemia: Secondary | ICD-10-CM

## 2023-11-28 ENCOUNTER — Encounter: Payer: Self-pay | Admitting: Family Medicine

## 2023-11-28 ENCOUNTER — Ambulatory Visit (INDEPENDENT_AMBULATORY_CARE_PROVIDER_SITE_OTHER): Admitting: Family Medicine

## 2023-11-28 VITALS — BP 134/66 | HR 69 | Temp 98.8°F | Ht 75.0 in | Wt 274.8 lb

## 2023-11-28 DIAGNOSIS — Z23 Encounter for immunization: Secondary | ICD-10-CM

## 2023-11-28 DIAGNOSIS — I10 Essential (primary) hypertension: Secondary | ICD-10-CM | POA: Diagnosis not present

## 2023-11-28 DIAGNOSIS — E1165 Type 2 diabetes mellitus with hyperglycemia: Secondary | ICD-10-CM | POA: Diagnosis not present

## 2023-11-28 DIAGNOSIS — Z7984 Long term (current) use of oral hypoglycemic drugs: Secondary | ICD-10-CM | POA: Diagnosis not present

## 2023-11-28 DIAGNOSIS — Z794 Long term (current) use of insulin: Secondary | ICD-10-CM

## 2023-11-28 MED ORDER — METFORMIN HCL 500 MG PO TABS
500.0000 mg | ORAL_TABLET | Freq: Two times a day (BID) | ORAL | 3 refills | Status: DC
Start: 2023-11-28 — End: 2024-02-23

## 2023-11-28 MED ORDER — METOPROLOL SUCCINATE ER 25 MG PO TB24: 25.0000 mg | ORAL_TABLET | Freq: Every day | ORAL | 1 refills | Status: DC

## 2023-11-28 MED ORDER — LISINOPRIL 20 MG PO TABS
20.0000 mg | ORAL_TABLET | Freq: Every day | ORAL | 1 refills | Status: DC
Start: 2023-11-28 — End: 2024-02-29

## 2023-11-28 NOTE — Patient Instructions (Signed)
 Diabetes control is great based on your last A1c.  As we discussed you have the option to decrease lantus  to 5 units or stay at 10units for now. If any true lows in the 50s or 60s stop Lantus  altogether.  Continue metformin  same dose for now.  I will check your labs today including your electrolytes and cholesterol levels.   Follow-up with me in 3 months but let me know if there are any questions or concerns sooner.  Pneumonia vaccine was updated today.

## 2023-11-28 NOTE — Progress Notes (Signed)
 Subjective:  Patient ID: Nathaniel Velazquez, male    DOB: [birth year]  Age: 63 y.o. MRN: [id number]  CC:  Chief Complaint  Patient presents with   Medical Management of Chronic Issues    Pt is well, no questions     HPI Nathaniel Velazquez presents for   Diabetes: Follow-up from April visit.  Prior diet controlled and worsening control with ER visit last year, hyperglycemia at that time and started on Lantus  in August along with metformin .  He was still taking Lantus  20 units/day with metformin  500 mg twice daily at his April visit with home readings 97-115, he is on ACE inhibitor.  Although no hypoglycemic symptoms, with significant improved control and A1c of 5.1, we lowered his Lantus  dose to 10 units/day.  Home readings: Past 24 days - average 104. No lows. Lowest 95-96.  No 200's. Highest 134.  Microalbumin: Normal ratio 03/07/2023. Optho, foot exam, pneumovax:  Pneumonia vaccine today.  Not fasting - ate 1 hr ago.   Lab Results  Component Value Date   HGBA1C 5.1 10/26/2023   HGBA1C 11.9 (H) 02/24/2023   HGBA1C 6.1 (A) 01/14/2022   Lab Results  Component Value Date   MICROALBUR <0.7 03/07/2023   LDLCALC 68 11/30/2019   CREATININE 0.83 03/07/2023    Hypertension: Lisinopril  20mg  every day, toprol  25mg  every day.  Home readings: 120/70's. Rare lightheadedness with standing quickly after seated position. No new sx's.  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   Body mass index (BMI) 33.0-33.9, adult  09/19/2018   Family history of cardiovascular disease 09/19/2018   Moderate alcohol consumption 09/19/2018   Psoriasis 09/19/2018   Psoriatic arthritis (HCC) 09/19/2018   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   Stiffness of right shoulder joint 08/19/2017   Stiffness of right shoulder, not elsewhere classified 08/19/2017   Traumatic complete tear of rotator cuff 08/04/2017   Strain of muscle(s) and tendon(s) of the rotator cuff of right shoulder, subsequent encounter 08/04/2017   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   Dysfunction of right eustachian tube 11/18/2015   Anal fissure 07/22/2015   Hypertensive disorder 07/03/2015   Essential hypertension 07/03/2015   Obesity    Hyperlipidemia 11/21/2008   PALPITATIONS 10/30/2008   Past Medical History:  Diagnosis Date   Arthritis    Cancer (HCC)    skin cancer   GERD (gastroesophageal reflux disease)    Hypertension    Obesity    Steroid injection in rt knee   Prediabetes 03/01/2022   hgA1c was 6.1, checks cbg at home, no meds at this time   Past Surgical History:  Procedure Laterality Date   ANAL FISTULECTOMY  1987   APPENDECTOMY     COLONOSCOPY  04/08/2021   KNEE SURGERY Bilateral    meniscal tear repairs   POLYPECTOMY     ROTATOR CUFF REPAIR Bilateral 2018   TONSILLECTOMY     UPPER GASTROINTESTINAL ENDOSCOPY     Allergies  Allergen Reactions  Gabapentin  Other (See Comments)    Made very jittery   Prior to Admission medications   Medication Sig Start Date End Date Taking? Authorizing Provider  ACCU-CHEK GUIDE test strip 3 times per day - fasting or 2 hours after meal. 02/24/23  Yes Benjiman Bras, MD  Accu-Chek Softclix Lancets lancets USE AS INSTRUCTED TO CHECK GLUCOSE DAILY AS DIRECTED 11/02/23  Yes Benjiman Bras, MD  blood glucose meter kit and supplies Dispense based on patient and insurance preference. Use up to four times daily as directed. (FOR ICD-10 E10.9, E11.9).once per day  fasting or 2 hours after meals. 10/15/21  Yes Benjiman Bras, MD  clobetasol ointment (TEMOVATE) 0.05 % as needed. As directed 05/15/12  Yes [provider]  finasteride (PROSCAR) 5 MG tablet Take 5 mg by mouth daily. 11/19/21  Yes [provider]  golimumab (SIMPONI ARIA) 50 MG/4ML SOLN injection Inject 50 mg into the vein every 8 (eight) weeks.   Yes [provider]  guaiFENesin -codeine  100-10 MG/5ML syrup Take 5-10 mLs by mouth at bedtime as needed for cough. 07/14/22  Yes Benjiman Bras, MD  insulin  glargine (LANTUS  SOLOSTAR) 100 UNIT/ML Solostar Pen Inject 20 Units into the skin daily. 04/26/23  Yes Benjiman Bras, MD  Insulin  Pen Needle (NOVOFINE) 30G X 8 MM MISC Inject 10 each into the skin as needed. 02/24/23  Yes Benjiman Bras, MD  lisinopril  (ZESTRIL ) 20 MG tablet Take 1 tablet (20 mg total) by mouth daily. 08/05/23  Yes Benjiman Bras, MD  metFORMIN  (GLUCOPHAGE ) 500 MG tablet Take 1 tablet (500 mg total) by mouth 2 (two) times daily with a meal. Start every day for initial week. 02/24/23  Yes Benjiman Bras, MD  metoprolol  succinate (TOPROL -XL) 25 MG 24 hr tablet TAKE 1 TABLET BY MOUTH DAILY 08/03/23  Yes Benjiman Bras, MD  pantoprazole  (PROTONIX ) 40 MG tablet Take 1 tablet (40 mg total) by mouth daily. 03/07/23  Yes Benjiman Bras, MD  psyllium (METAMUCIL) 58.6 % powder Take 1 packet by mouth 2 (two) times daily.    Yes [provider]  sildenafil  (REVATIO ) 20 MG tablet TAKE 1 TO 3 TABLETS BY MOUTH DAILY AS NEEDED 01/10/23  Yes Benjiman Bras, MD  SKYRIZI PEN 150 MG/ML pen Inject into the skin. 12/20/22  Yes [provider]  tamsulosin (FLOMAX) 0.4 MG CAPS capsule Take 0.4 mg by mouth daily. 11/19/21  Yes [provider]  valACYclovir (VALTREX) 500 MG tablet Take by mouth as needed. 02/23/21  Yes [provider]   Social History   Socioeconomic History   Marital status: Married    Spouse name: Not on file    Number of children: Not on file   Years of education: Not on file   Highest education level: Bachelor's degree (e.g., BA, AB, BS)  Occupational History   Not on file  Tobacco Use   Smoking status: Never   Smokeless tobacco: Never  Vaping Use   Vaping status: Never Used  Substance and Sexual Activity   Alcohol use: Not Currently    Comment: occ   Drug use: Never   Sexual activity: Yes  Other Topics Concern   Not on file  Social History Narrative   Sales rep   Social Drivers of Health   Financial Resource Strain: Low Risk  (02/28/2023)   Overall Financial Resource Strain (CARDIA)    Difficulty of Paying Living Expenses: Not hard at all  Food Insecurity: No Food  Insecurity (02/28/2023)   Hunger Vital Sign    Worried About Running Out of Food in the Last Year: Never true    Ran Out of Food in the Last Year: Never true  Transportation Needs: No Transportation Needs (02/28/2023)   PRAPARE - Administrator, Civil Service (Medical): No    Lack of Transportation (Non-Medical): No  Physical Activity: Unknown (02/28/2023)   Exercise Vital Sign    Days of Exercise per Week: 3 days    Minutes of Exercise per Session: Not on file  Stress: No Stress Concern Present (02/28/2023)   [employer] of Occupational Health - Occupational Stress Questionnaire    Feeling of Stress : Only a little  Social Connections: Unknown (02/28/2023)   Social Connection and Isolation Panel [NHANES]    Frequency of Communication with Friends and Family: Patient declined    Frequency of Social Gatherings with Friends and Family: Patient declined    Attends Religious Services: Patient declined    Database administrator or Organizations: No    Attends Engineer, structural: Not on file    Marital Status: Married  Catering manager Violence: Not on file    Review of Systems  Constitutional:  Negative for fatigue and unexpected weight change.  Eyes:  Negative for visual disturbance.   Respiratory:  Negative for cough, chest tightness and shortness of breath.   Cardiovascular:  Negative for chest pain, palpitations and leg swelling.  Gastrointestinal:  Negative for abdominal pain and blood in stool.  Neurological:  Negative for dizziness, light-headedness and headaches.     Objective:   Vitals:   11/28/23 1500 11/28/23 1522  BP: (!) 148/66 134/66  Pulse: 69   Temp: 98.8 F (37.1 C)   TempSrc: Temporal   SpO2: 98%   Weight: 274 lb 12.8 oz (124.6 kg)   Height: 6\' 3"  (1.905 m)      Physical Exam Vitals reviewed.  Constitutional:      Appearance: He is well-developed.  HENT:     Head: Normocephalic and atraumatic.  Neck:     Vascular: No carotid bruit or JVD.  Cardiovascular:     Rate and Rhythm: Normal rate and regular rhythm.     Heart sounds: Normal heart sounds. No murmur heard. Pulmonary:     Effort: Pulmonary effort is normal.     Breath sounds: Normal breath sounds. No rales.  Musculoskeletal:     Right lower leg: No edema.     Left lower leg: No edema.  Skin:    General: Skin is warm and dry.  Neurological:     Mental Status: He is alert and oriented to person, place, and time.  Psychiatric:        Mood and Affect: Mood normal.        Assessment & Plan:  Nathaniel Velazquez is a 63 y.o. male . Type 2 diabetes mellitus with hyperglycemia, unspecified whether long term insulin  use (HCC) - Plan: Lipid panel, metFORMIN  (GLUCOPHAGE ) 500 MG tablet  Essential hypertension - Plan: Comprehensive metabolic panel with GFR, Lipid panel, lisinopril  (ZESTRIL ) 20 MG tablet, metoprolol  succinate (TOPROL -XL) 25 MG 24 hr tablet  Need for pneumococcal vaccine - Plan: Pneumococcal conjugate vaccine 20-valent (Prevnar 20)  Very well-controlled diabetes.  Option to decrease dosage of insulin  further to 5 units or off but he would like to continue 10 units for now.  Monitor for any hypoglycemia and if that occurs needs to stop Lantus  altogether.  Continue  metformin   same dose for now, check CMP and lipids, hypertension stable on repeat testing, no med changes for now.  Pneumonia vaccine updated, 28-month follow-up.   Meds ordered this encounter  Medications   lisinopril  (ZESTRIL ) 20 MG tablet    Sig: Take 1 tablet (20 mg total) by mouth daily.    Dispense:  90 tablet    Refill:  1   metFORMIN  (GLUCOPHAGE ) 500 MG tablet    Sig: Take 1 tablet (500 mg total) by mouth 2 (two) times daily with a meal.    Dispense:  180 tablet    Refill:  3   metoprolol  succinate (TOPROL -XL) 25 MG 24 hr tablet    Sig: Take 1 tablet (25 mg total) by mouth daily.    Dispense:  90 tablet    Refill:  1   Patient Instructions  Diabetes control is great based on your last A1c.  As we discussed you have the option to decrease lantus  to 5 units or stay at 10units for now. If any true lows in the 50s or 60s stop Lantus  altogether.  Continue metformin  same dose for now.  I will check your labs today including your electrolytes and cholesterol levels.   Follow-up with me in 3 months but let me know if there are any questions or concerns sooner.  Pneumonia vaccine was updated today.    Signed,   Caro Christmas, MD  Primary Care, [facility] 11/28/23 3:42 PM

## 2023-11-29 LAB — COMPREHENSIVE METABOLIC PANEL WITH GFR
ALT: 73 U/L — ABNORMAL HIGH (ref 0–53)
AST: 64 U/L — ABNORMAL HIGH (ref 0–37)
Albumin: 4.4 g/dL (ref 3.5–5.2)
Alkaline Phosphatase: 88 U/L (ref 39–117)
BUN: 25 mg/dL — ABNORMAL HIGH (ref 6–23)
CO2: 21 meq/L (ref 19–32)
Calcium: 9.6 mg/dL (ref 8.4–10.5)
Chloride: 102 meq/L (ref 96–112)
Creatinine, Ser: 1.05 mg/dL (ref 0.40–1.50)
GFR: 75.37 mL/min (ref >60.00)
Glucose, Bld: 133 mg/dL — ABNORMAL HIGH (ref 70–99)
Potassium: 4.8 meq/L (ref 3.5–5.1)
Sodium: 133 meq/L — ABNORMAL LOW (ref 135–145)
Total Bilirubin: 1.3 mg/dL — ABNORMAL HIGH (ref 0.2–1.2)
Total Protein: 7.5 g/dL (ref 6.0–8.3)

## 2023-11-29 LAB — LIPID PANEL
Cholesterol: 266 mg/dL — ABNORMAL HIGH (ref 0–200)
HDL: 61 mg/dL (ref >39.00)
LDL Cholesterol: 154 mg/dL — ABNORMAL HIGH (ref 0–99)
NonHDL: 205.13
Total CHOL/HDL Ratio: 4
Triglycerides: 256 mg/dL — ABNORMAL HIGH (ref 0.0–149.0)
VLDL: 51.2 mg/dL — ABNORMAL HIGH (ref 0.0–40.0)

## 2023-12-04 ENCOUNTER — Ambulatory Visit: Payer: Self-pay | Admitting: Family Medicine

## 2023-12-05 NOTE — Telephone Encounter (Signed)
 Patient is requesting to hold medication and attempt to change this cholesterol with diet please advise if you have any concerns with patient waiting

## 2023-12-06 ENCOUNTER — Other Ambulatory Visit: Payer: Self-pay | Admitting: Family Medicine

## 2023-12-06 DIAGNOSIS — N529 Male erectile dysfunction, unspecified: Secondary | ICD-10-CM

## 2024-01-23 ENCOUNTER — Encounter: Payer: Self-pay | Admitting: Family Medicine

## 2024-01-23 ENCOUNTER — Other Ambulatory Visit: Payer: Self-pay | Admitting: Family Medicine

## 2024-01-23 DIAGNOSIS — N400 Enlarged prostate without lower urinary tract symptoms: Secondary | ICD-10-CM

## 2024-01-24 MED ORDER — TAMSULOSIN HCL 0.4 MG PO CAPS
0.4000 mg | ORAL_CAPSULE | Freq: Every day | ORAL | 0 refills | Status: DC
Start: 2024-01-24 — End: 2024-03-05

## 2024-01-24 NOTE — Telephone Encounter (Signed)
 Patient notes his Urologist Dr Rosalind has retired from [employer] and he is asking for a new referral to a new office as well as for you to write his Flomax  for the time being.  Last OV 11/28/2023 for routine follow up.  Please advise

## 2024-02-23 ENCOUNTER — Other Ambulatory Visit: Payer: Self-pay | Admitting: Family Medicine

## 2024-02-23 DIAGNOSIS — E1165 Type 2 diabetes mellitus with hyperglycemia: Secondary | ICD-10-CM

## 2024-02-27 ENCOUNTER — Other Ambulatory Visit: Payer: Self-pay | Admitting: Family Medicine

## 2024-02-27 DIAGNOSIS — K219 Gastro-esophageal reflux disease without esophagitis: Secondary | ICD-10-CM

## 2024-02-29 ENCOUNTER — Ambulatory Visit: Admitting: Family Medicine

## 2024-02-29 ENCOUNTER — Encounter: Payer: Self-pay | Admitting: Family Medicine

## 2024-02-29 VITALS — BP 160/60 | HR 75 | Temp 98.6°F | Wt 273.0 lb

## 2024-02-29 DIAGNOSIS — I10 Essential (primary) hypertension: Secondary | ICD-10-CM | POA: Diagnosis not present

## 2024-02-29 DIAGNOSIS — D696 Thrombocytopenia, unspecified: Secondary | ICD-10-CM | POA: Diagnosis not present

## 2024-02-29 DIAGNOSIS — E1165 Type 2 diabetes mellitus with hyperglycemia: Secondary | ICD-10-CM

## 2024-02-29 MED ORDER — LISINOPRIL-HYDROCHLOROTHIAZIDE 20-12.5 MG PO TABS: 1.0000 | ORAL_TABLET | Freq: Every day | ORAL | 3 refills | Status: DC

## 2024-02-29 NOTE — Progress Notes (Signed)
 Subjective:  Patient ID: Nathaniel Velazquez, male    DOB: 1961-02-10  Age: 63 y.o. MRN: [id number]  CC:  Chief Complaint  Patient presents with   Medical Management of Chronic Issues    3 month medication recheck for Diabetes     HPI Nathaniel Velazquez presents for  follow up.   Hypertension: Followed by rheumatology for polyarticular psoriatic arthritis, with Simponia aria every 8 weeks, works well until about day 50 - small amt of prednisone  needed for last week prior to repeat dose due to breakthrough pain. (10mg  for 3 days, 5mg  for 3-4 days).  BP started to increase past 6 months. Prior 120/70, now more in 140-150/70 range.  Lisinopril  20mg  every day, toprol  XL 25mg  every day. Walking more, more exercise at gym.  No change in salt intake.  BP Readings from Last 3 Encounters:  02/29/24 (!) 160/60  11/28/23 134/66  10/26/23 128/78   Lab Results  Component Value Date   CREATININE 1.05 11/28/2023    History of thrombocytopenia, last CBC in August 2024.  No new bleeding. Lab Results  Component Value Date   WBC 5.0 02/24/2023   HGB 14.0 02/24/2023   HCT 42.0 02/24/2023   MCV 95.8 02/24/2023   PLT 73.0 (L) 02/24/2023     Diabetes: Last visit in May.  Prior history of diet-controlled diabetes then worsening control with ER visit last year for hyperglycemia, A1c 11.9 in August, initially had been started on Lantus  along with metformin .  If improving control lowered his dose of Lantus  to 10 units/day and as of the last visit was still taking metformin  500 mg twice daily as well he is on ACE inhibitor.  Based on that significant improved control in April option of lower dosing of Lantus  but he decided to remain on 10 units at that time.  Hypoglycemia precautions given. Still on lantus  10u per day. Metformin  500mg  BID.  Appt with urology in 1 week.  Home readings fasting: 115-120, average 90 day 119.  Postprandial: 140 No symptomatic lows - lowest 99.   Microalbumin: Due  today. Optho, foot exam, pneumovax: Up-to-date, flu vaccine recommended this fall.  Colonoscopy due in September.  COVID booster recommended with flu vaccine.  Lab Results  Component Value Date   HGBA1C 5.1 10/26/2023   HGBA1C 11.9 (H) 02/24/2023   HGBA1C 6.1 (A) 01/14/2022   Lab Results  Component Value Date   LDLCALC 154 (H) 11/28/2023   CREATININE 1.05 11/28/2023   Wt Readings from Last 3 Encounters:  02/29/24 273 lb (123.8 kg)  11/28/23 274 lb 12.8 oz (124.6 kg)  10/26/23 271 lb 6.4 oz (123.1 kg)     History Patient Active Problem List   Diagnosis Date Noted   Basal cell carcinoma (BCC) 06/21/2023   Pain in joint of right ankle 06/22/2021   Pain in right ankle and joints of right foot 06/22/2021   Perianal ulcer (HCC) 12/02/2020   High risk medication use 01/14/2020   Pain in left knee 06/28/2019   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   Body mass index (BMI) 33.0-33.9, adult 09/19/2018   Family history of cardiovascular disease 09/19/2018   Moderate alcohol consumption 09/19/2018   Psoriasis 09/19/2018   Psoriatic arthritis (HCC) 09/19/2018   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   Stiffness of right shoulder joint 08/19/2017   Stiffness of right shoulder, not elsewhere classified 08/19/2017   Traumatic complete tear of rotator cuff 08/04/2017   Strain of muscle(s) and tendon(s) of the rotator  cuff of right shoulder, subsequent encounter 08/04/2017   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   Dysfunction of right eustachian tube 11/18/2015   Anal fissure 07/22/2015   Hypertensive disorder 07/03/2015   Essential hypertension 07/03/2015   Obesity    Hyperlipidemia 11/21/2008   PALPITATIONS 10/30/2008   Past Medical History:  Diagnosis Date   Arthritis    Cancer (HCC)    skin cancer   GERD (gastroesophageal reflux disease)    Hypertension    Obesity    Steroid injection in rt knee   Prediabetes 03/01/2022   hgA1c was 6.1, checks  cbg at home, no meds at this time   Past Surgical History:  Procedure Laterality Date   ANAL FISTULECTOMY  1987   APPENDECTOMY     COLONOSCOPY  04/08/2021   KNEE SURGERY Bilateral    meniscal tear repairs   POLYPECTOMY     ROTATOR CUFF REPAIR Bilateral 2018   TONSILLECTOMY     UPPER GASTROINTESTINAL ENDOSCOPY     Allergies  Allergen Reactions   Gabapentin  Other (See Comments)    Made very jittery   Prior to Admission medications   Medication Sig Start Date End Date Taking? Authorizing Provider  Accu-Chek Softclix Lancets lancets USE AS INSTRUCTED TO CHECK GLUCOSE DAILY AS DIRECTED 11/02/23  Yes Levora Reyes SAUNDERS, MD  blood glucose meter kit and supplies Dispense based on patient and insurance preference. Use up to four times daily as directed. (FOR ICD-10 E10.9, E11.9).once per day fasting or 2 hours after meals. 10/15/21  Yes Levora Reyes SAUNDERS, MD  clobetasol ointment (TEMOVATE) 0.05 % as needed. As directed 05/15/12  Yes [provider]  finasteride (PROSCAR) 5 MG tablet Take 5 mg by mouth daily. 11/19/21  Yes [provider]  glucose blood (ACCU-CHEK GUIDE TEST) test strip USE THREE TIMES A DAY FASTING OR 2 HOURS AFTER A MEAL 01/24/24  Yes Levora Reyes SAUNDERS, MD  golimumab (SIMPONI ARIA) 50 MG/4ML SOLN injection Inject 50 mg into the vein every 8 (eight) weeks.   Yes [provider]  insulin  glargine (LANTUS  SOLOSTAR) 100 UNIT/ML Solostar Pen Inject 20 Units into the skin daily. 04/26/23  Yes Levora Reyes SAUNDERS, MD  Insulin  Pen Needle (NOVOFINE) 30G X 8 MM MISC Inject 10 each into the skin as needed. 02/24/23  Yes Levora Reyes SAUNDERS, MD  lisinopril  (ZESTRIL ) 20 MG tablet Take 1 tablet (20 mg total) by mouth daily. 11/28/23  Yes Levora Reyes SAUNDERS, MD  metFORMIN  (GLUCOPHAGE ) 500 MG tablet TAKE 1 TABLET BY MOUTH 2 TIMES A DAY WITH A MEAL. START EVERY DAY FOR INITIAL WEEK AS DIRECTED 02/23/24  Yes Levora Reyes SAUNDERS, MD  metoprolol  succinate (TOPROL -XL) 25 MG 24 hr tablet  Take 1 tablet (25 mg total) by mouth daily. 11/28/23  Yes Levora Reyes SAUNDERS, MD  pantoprazole  (PROTONIX ) 40 MG tablet TAKE 1 TABLET BY MOUTH DAILY 02/27/24  Yes Levora Reyes SAUNDERS, MD  predniSONE  (DELTASONE ) 5 MG tablet 2 tablets as needed for flares Orally Once a day; Duration: 30 days   Yes [provider]  psyllium (METAMUCIL) 58.6 % powder Take 1 packet by mouth 2 (two) times daily.    Yes [provider]  sildenafil  (REVATIO ) 20 MG tablet TAKE 1 TO 3 TABLETS BY MOUTH DAILY AS NEEDED 12/06/23  Yes Levora Reyes SAUNDERS, MD  tamsulosin  (FLOMAX ) 0.4 MG CAPS capsule Take 1 capsule (0.4 mg total) by mouth daily. 01/24/24  Yes Levora Reyes SAUNDERS, MD  valACYclovir (VALTREX) 500  MG tablet Take by mouth as needed. 02/23/21  Yes [provider]  SKYRIZI PEN 150 MG/ML pen Inject into the skin. 12/20/22   [provider]   Social History   Socioeconomic History   Marital status: Married    Spouse name: Not on file   Number of children: Not on file   Years of education: Not on file   Highest education level: Bachelor's degree (e.g., BA, AB, BS)  Occupational History   Not on file  Tobacco Use   Smoking status: Never   Smokeless tobacco: Never  Vaping Use   Vaping status: Never Used  Substance and Sexual Activity   Alcohol use: Not Currently    Comment: occ   Drug use: Never   Sexual activity: Yes  Other Topics Concern   Not on file  Social History Narrative   Sales rep   Social Drivers of Health   Financial Resource Strain: Low Risk  (02/28/2023)   Overall Financial Resource Strain (CARDIA)    Difficulty of Paying Living Expenses: Not hard at all  Food Insecurity: No Food Insecurity (02/28/2023)   Hunger Vital Sign    Worried About Running Out of Food in the Last Year: Never true    Ran Out of Food in the Last Year: Never true  Transportation Needs: No Transportation Needs (02/28/2023)   PRAPARE - Administrator, Civil Service (Medical): No    Lack  of Transportation (Non-Medical): No  Physical Activity: Unknown (02/28/2023)   Exercise Vital Sign    Days of Exercise per Week: 3 days    Minutes of Exercise per Session: Not on file  Stress: No Stress Concern Present (02/28/2023)   [employer] of Occupational Health - Occupational Stress Questionnaire    Feeling of Stress : Only a little  Social Connections: Unknown (02/28/2023)   Social Connection and Isolation Panel    Frequency of Communication with Friends and Family: Patient declined    Frequency of Social Gatherings with Friends and Family: Patient declined    Attends Religious Services: Patient declined    Database administrator or Organizations: No    Attends Engineer, structural: Not on file    Marital Status: Married  Catering manager Violence: Not on file    Review of Systems  Constitutional:  Negative for fatigue and unexpected weight change.  Eyes:  Negative for visual disturbance.  Respiratory:  Negative for cough, chest tightness and shortness of breath.   Cardiovascular:  Negative for chest pain, palpitations and leg swelling.  Gastrointestinal:  Negative for abdominal pain and blood in stool.  Neurological:  Negative for dizziness, light-headedness and headaches.     Objective:   Vitals:   02/29/24 1525  BP: (!) 160/60  Pulse: 75  Temp: 98.6 F (37 C)  TempSrc: Temporal  SpO2: 97%  Weight: 273 lb (123.8 kg)     Physical Exam Vitals reviewed.  Constitutional:      Appearance: He is well-developed.  HENT:     Head: Normocephalic and atraumatic.  Neck:     Vascular: No carotid bruit or JVD.  Cardiovascular:     Rate and Rhythm: Normal rate and regular rhythm.     Heart sounds: Normal heart sounds. No murmur heard. Pulmonary:     Effort: Pulmonary effort is normal.     Breath sounds: Normal breath sounds. No rales.  Musculoskeletal:     Right lower leg: No edema.     Left lower  leg: No edema.  Skin:    General: Skin is warm and  dry.  Neurological:     Mental Status: He is alert and oriented to person, place, and time.  Psychiatric:        Mood and Affect: Mood normal.        Assessment & Plan:  Nathaniel Velazquez is a 63 y.o. male . Type 2 diabetes mellitus with hyperglycemia, unspecified whether long term insulin  use (HCC) - Plan: Microalbumin / creatinine urine ratio, Hemoglobin A1c, lisinopril -hydrochlorothiazide  (ZESTORETIC ) 20-12.5 MG tablet, Comprehensive metabolic panel with GFR  - Previously well-controlled, check labs and adjust medication regimen accordingly including the option to cut back or discontinue insulin .  Essential hypertension - Plan: Comprehensive metabolic panel with GFR  - Decreased control will adjust meds and add low-dose hydrochlorothiazide  to current dose of lisinopril .  Continue same dose of Toprol  at this time.  Potential side effects and risk discussed, RTC precautions, labs as above.  Thrombocytopenia (HCC) - Plan: CBC  - History of same, asymptomatic.  Check CBC  Meds ordered this encounter  Medications   lisinopril -hydrochlorothiazide  (ZESTORETIC ) 20-12.5 MG tablet    Sig: Take 1 tablet by mouth daily.    Dispense:  30 tablet    Refill:  3   Patient Instructions  Call [facility] to determine if you are due for repeat colonoscopy, and schedule if so.  [phone]  Thank you for coming in today.  For improved blood pressure control will change your lisinopril  to a combination lisinopril  with hydrochlorothiazide .  Monitor blood pressures on that new medication and if any low blood pressures any side effects return to lisinopril  alone and let me know.  No change in diabetes medications at this time, but if A1c is still low we may decide to back off the insulin .  I will let you know. Take care!  Managing Your Hypertension Hypertension, also called high blood pressure, is when the force of the blood pressing against the walls of the arteries is too strong. Arteries are blood  vessels that carry blood from your heart throughout your body. Hypertension forces the heart to work harder to pump blood and may cause the arteries to become narrow or stiff. Understanding blood pressure readings A blood pressure reading includes a higher number over a lower number: The first, or top, number is called the systolic pressure. It is a measure of the pressure in your arteries as your heart beats. The second, or bottom number, is called the diastolic pressure. It is a measure of the pressure in your arteries as the heart relaxes. For most people, a normal blood pressure is below 120/80. Your personal target blood pressure may vary depending on your medical conditions, your age, and other factors. Blood pressure is classified into four stages. Based on your blood pressure reading, your health care provider may use the following stages to determine what type of treatment you need, if any. Systolic pressure and diastolic pressure are measured in a unit called millimeters of mercury (mmHg). Normal Systolic pressure: below 120. Diastolic pressure: below 80. Elevated Systolic pressure: 120-129. Diastolic pressure: below 80. Hypertension stage 1 Systolic pressure: 130-139. Diastolic pressure: 80-89. Hypertension stage 2 Systolic pressure: 140 or above. Diastolic pressure: 90 or above. How can this condition affect me? Managing your hypertension is very important. Over time, hypertension can damage the arteries and decrease blood flow to parts of the body, including the brain, heart, and kidneys. Having untreated or uncontrolled hypertension can lead to:  A heart attack. A stroke. A weakened blood vessel (aneurysm). Heart failure. Kidney damage. Eye damage. Memory and concentration problems. Vascular dementia. What actions can I take to manage this condition? Hypertension can be managed by making lifestyle changes and possibly by taking medicines. Your health care provider will help  you make a plan to bring your blood pressure within a normal range. You may be referred for counseling on a healthy diet and physical activity. Nutrition  Eat a diet that is high in fiber and potassium, and low in salt (sodium), added sugar, and fat. An example eating plan is called the DASH diet. DASH stands for Dietary Approaches to Stop Hypertension. To eat this way: Eat plenty of fresh fruits and vegetables. Try to fill one-half of your plate at each meal with fruits and vegetables. Eat whole grains, such as whole-wheat pasta, brown rice, or whole-grain bread. Fill about one-fourth of your plate with whole grains. Eat low-fat dairy products. Avoid fatty cuts of meat, processed or cured meats, and poultry with skin. Fill about one-fourth of your plate with lean proteins such as fish, chicken without skin, beans, eggs, and tofu. Avoid pre-made and processed foods. These tend to be higher in sodium, added sugar, and fat. Reduce your daily sodium intake. Many people with hypertension should eat less than 1,500 mg of sodium a day. Lifestyle  Work with your health care provider to maintain a healthy body weight or to lose weight. Ask what an ideal weight is for you. Get at least 30 minutes of exercise that causes your heart to beat faster (aerobic exercise) most days of the week. Activities may include walking, swimming, or biking. Include exercise to strengthen your muscles (resistance exercise), such as weight lifting, as part of your weekly exercise routine. Try to do these types of exercises for 30 minutes at least 3 days a week. Do not use any products that contain nicotine or tobacco. These products include cigarettes, chewing tobacco, and vaping devices, such as e-cigarettes. If you need help quitting, ask your health care provider. Control any long-term (chronic) conditions you have, such as high cholesterol or diabetes. Identify your sources of stress and find ways to manage stress. This may  include meditation, deep breathing, or making time for fun activities. Alcohol use Do not drink alcohol if: Your health care provider tells you not to drink. You are pregnant, may be pregnant, or are planning to become pregnant. If you drink alcohol: Limit how much you have to: 0-1 drink a day for women. 0-2 drinks a day for men. Know how much alcohol is in your drink. In the [place] one drink equals one 12 oz bottle of beer (355 mL), one 5 oz glass of wine (148 mL), or one 1 oz glass of hard liquor (44 mL). Medicines Your health care provider may prescribe medicine if lifestyle changes are not enough to get your blood pressure under control and if: Your systolic blood pressure is 130 or higher. Your diastolic blood pressure is 80 or higher. Take medicines only as told by your health care provider. Follow the directions carefully. Blood pressure medicines must be taken as told by your health care provider. The medicine does not work as well when you skip doses. Skipping doses also puts you at risk for problems. Monitoring Before you monitor your blood pressure: Do not smoke, drink caffeinated beverages, or exercise within 30 minutes before taking a measurement. Use the bathroom and empty your bladder (urinate). Sit quietly for at  least 5 minutes before taking measurements. Monitor your blood pressure at home as told by your health care provider. To do this: Sit with your back straight and supported. Place your feet flat on the floor. Do not cross your legs. Support your arm on a flat surface, such as a table. Make sure your upper arm is at heart level. Each time you measure, take two or three readings one minute apart and record the results. You may also need to have your blood pressure checked regularly by your health care provider. General information Talk with your health care provider about your diet, exercise habits, and other lifestyle factors that may be contributing to  hypertension. Review all the medicines you take with your health care provider because there may be side effects or interactions. Keep all follow-up visits. Your health care provider can help you create and adjust your plan for managing your high blood pressure. Where to find more information National Heart, Lung, and Blood Institute: PopSteam.is American Heart Association: www.heart.org Contact a health care provider if: You think you are having a reaction to medicines you have taken. You have repeated (recurrent) headaches. You feel dizzy. You have swelling in your ankles. You have trouble with your vision. Get help right away if: You develop a severe headache or confusion. You have unusual weakness or numbness, or you feel faint. You have severe pain in your chest or abdomen. You vomit repeatedly. You have trouble breathing. These symptoms may be an emergency. Get help right away. Call 911. Do not wait to see if the symptoms will go away. Do not drive yourself to the hospital. Summary Hypertension is when the force of blood pumping through your arteries is too strong. If this condition is not controlled, it may put you at risk for serious complications. Your personal target blood pressure may vary depending on your medical conditions, your age, and other factors. For most people, a normal blood pressure is less than 120/80. Hypertension is managed by lifestyle changes, medicines, or both. Lifestyle changes to help manage hypertension include losing weight, eating a healthy, low-sodium diet, exercising more, stopping smoking, and limiting alcohol. This information is not intended to replace advice given to you by your health care provider. Make sure you discuss any questions you have with your health care provider. Document Revised: 03/19/2021 Document Reviewed: 03/19/2021 Elsevier Patient Education  2024 Elsevier Inc.     Signed,   Reyes Pines, MD [facility], [facility] 02/29/24 3:41 PM

## 2024-02-29 NOTE — Patient Instructions (Addendum)
 Call [facility] to determine if you are due for repeat colonoscopy, and schedule if so.  [phone]  Thank you for coming in today.  For improved blood pressure control will change your lisinopril  to a combination lisinopril  with hydrochlorothiazide .  Monitor blood pressures on that new medication and if any low blood pressures any side effects return to lisinopril  alone and let me know.  No change in diabetes medications at this time, but if A1c is still low we may decide to back off the insulin .  I will let you know. Take care!  Managing Your Hypertension Hypertension, also called high blood pressure, is when the force of the blood pressing against the walls of the arteries is too strong. Arteries are blood vessels that carry blood from your heart throughout your body. Hypertension forces the heart to work harder to pump blood and may cause the arteries to become narrow or stiff. Understanding blood pressure readings A blood pressure reading includes a higher number over a lower number: The first, or top, number is called the systolic pressure. It is a measure of the pressure in your arteries as your heart beats. The second, or bottom number, is called the diastolic pressure. It is a measure of the pressure in your arteries as the heart relaxes. For most people, a normal blood pressure is below 120/80. Your personal target blood pressure may vary depending on your medical conditions, your age, and other factors. Blood pressure is classified into four stages. Based on your blood pressure reading, your health care provider may use the following stages to determine what type of treatment you need, if any. Systolic pressure and diastolic pressure are measured in a unit called millimeters of mercury (mmHg). Normal Systolic pressure: below 120. Diastolic pressure: below 80. Elevated Systolic pressure: 120-129. Diastolic pressure: below 80. Hypertension stage 1 Systolic pressure: 130-139. Diastolic  pressure: 80-89. Hypertension stage 2 Systolic pressure: 140 or above. Diastolic pressure: 90 or above. How can this condition affect me? Managing your hypertension is very important. Over time, hypertension can damage the arteries and decrease blood flow to parts of the body, including the brain, heart, and kidneys. Having untreated or uncontrolled hypertension can lead to: A heart attack. A stroke. A weakened blood vessel (aneurysm). Heart failure. Kidney damage. Eye damage. Memory and concentration problems. Vascular dementia. What actions can I take to manage this condition? Hypertension can be managed by making lifestyle changes and possibly by taking medicines. Your health care provider will help you make a plan to bring your blood pressure within a normal range. You may be referred for counseling on a healthy diet and physical activity. Nutrition  Eat a diet that is high in fiber and potassium, and low in salt (sodium), added sugar, and fat. An example eating plan is called the DASH diet. DASH stands for Dietary Approaches to Stop Hypertension. To eat this way: Eat plenty of fresh fruits and vegetables. Try to fill one-half of your plate at each meal with fruits and vegetables. Eat whole grains, such as whole-wheat pasta, brown rice, or whole-grain bread. Fill about one-fourth of your plate with whole grains. Eat low-fat dairy products. Avoid fatty cuts of meat, processed or cured meats, and poultry with skin. Fill about one-fourth of your plate with lean proteins such as fish, chicken without skin, beans, eggs, and tofu. Avoid pre-made and processed foods. These tend to be higher in sodium, added sugar, and fat. Reduce your daily sodium intake. Many people with hypertension should eat  less than 1,500 mg of sodium a day. Lifestyle  Work with your health care provider to maintain a healthy body weight or to lose weight. Ask what an ideal weight is for you. Get at least 30 minutes of  exercise that causes your heart to beat faster (aerobic exercise) most days of the week. Activities may include walking, swimming, or biking. Include exercise to strengthen your muscles (resistance exercise), such as weight lifting, as part of your weekly exercise routine. Try to do these types of exercises for 30 minutes at least 3 days a week. Do not use any products that contain nicotine or tobacco. These products include cigarettes, chewing tobacco, and vaping devices, such as e-cigarettes. If you need help quitting, ask your health care provider. Control any long-term (chronic) conditions you have, such as high cholesterol or diabetes. Identify your sources of stress and find ways to manage stress. This may include meditation, deep breathing, or making time for fun activities. Alcohol use Do not drink alcohol if: Your health care provider tells you not to drink. You are pregnant, may be pregnant, or are planning to become pregnant. If you drink alcohol: Limit how much you have to: 0-1 drink a day for women. 0-2 drinks a day for men. Know how much alcohol is in your drink. In the [place] one drink equals one 12 oz bottle of beer (355 mL), one 5 oz glass of wine (148 mL), or one 1 oz glass of hard liquor (44 mL). Medicines Your health care provider may prescribe medicine if lifestyle changes are not enough to get your blood pressure under control and if: Your systolic blood pressure is 130 or higher. Your diastolic blood pressure is 80 or higher. Take medicines only as told by your health care provider. Follow the directions carefully. Blood pressure medicines must be taken as told by your health care provider. The medicine does not work as well when you skip doses. Skipping doses also puts you at risk for problems. Monitoring Before you monitor your blood pressure: Do not smoke, drink caffeinated beverages, or exercise within 30 minutes before taking a measurement. Use the bathroom and empty  your bladder (urinate). Sit quietly for at least 5 minutes before taking measurements. Monitor your blood pressure at home as told by your health care provider. To do this: Sit with your back straight and supported. Place your feet flat on the floor. Do not cross your legs. Support your arm on a flat surface, such as a table. Make sure your upper arm is at heart level. Each time you measure, take two or three readings one minute apart and record the results. You may also need to have your blood pressure checked regularly by your health care provider. General information Talk with your health care provider about your diet, exercise habits, and other lifestyle factors that may be contributing to hypertension. Review all the medicines you take with your health care provider because there may be side effects or interactions. Keep all follow-up visits. Your health care provider can help you create and adjust your plan for managing your high blood pressure. Where to find more information National Heart, Lung, and Blood Institute: PopSte[employer].is American Heart Association: www.heart.org Contact a health care provider if: You think you are having a reaction to medicines you have taken. You have repeated (recurrent) headaches. You feel dizzy. You have swelling in your ankles. You have trouble with your vision. Get help right away if: You develop a severe headache or confusion.  You have unusual weakness or numbness, or you feel faint. You have severe pain in your chest or abdomen. You vomit repeatedly. You have trouble breathing. These symptoms may be an emergency. Get help right away. Call 911. Do not wait to see if the symptoms will go away. Do not drive yourself to the hospital. Summary Hypertension is when the force of blood pumping through your arteries is too strong. If this condition is not controlled, it may put you at risk for serious complications. Your personal target blood  pressure may vary depending on your medical conditions, your age, and other factors. For most people, a normal blood pressure is less than 120/80. Hypertension is managed by lifestyle changes, medicines, or both. Lifestyle changes to help manage hypertension include losing weight, eating a healthy, low-sodium diet, exercising more, stopping smoking, and limiting alcohol. This information is not intended to replace advice given to you by your health care provider. Make sure you discuss any questions you have with your health care provider. Document Revised: 03/19/2021 Document Reviewed: 03/19/2021 Elsevier Patient Education  2024 [employer].

## 2024-03-01 LAB — COMPREHENSIVE METABOLIC PANEL WITH GFR
ALT: 99 U/L — ABNORMAL HIGH (ref 0–53)
AST: 109 U/L — ABNORMAL HIGH (ref 0–37)
Albumin: 4.4 g/dL (ref 3.5–5.2)
Alkaline Phosphatase: 110 U/L (ref 39–117)
BUN: 18 mg/dL (ref 6–23)
CO2: 26 meq/L (ref 19–32)
Calcium: 10.2 mg/dL (ref 8.4–10.5)
Chloride: 98 meq/L (ref 96–112)
Creatinine, Ser: 0.87 mg/dL (ref 0.40–1.50)
GFR: 91.45 mL/min (ref >60.00)
Glucose, Bld: 217 mg/dL — ABNORMAL HIGH (ref 70–99)
Potassium: 5.2 meq/L — ABNORMAL HIGH (ref 3.5–5.1)
Sodium: 132 meq/L — ABNORMAL LOW (ref 135–145)
Total Bilirubin: 1.9 mg/dL — ABNORMAL HIGH (ref 0.2–1.2)
Total Protein: 8.1 g/dL (ref 6.0–8.3)

## 2024-03-01 LAB — CBC
HCT: 41.9 % (ref 39.0–52.0)
Hemoglobin: 14 g/dL (ref 13.0–17.0)
MCHC: 33.4 g/dL (ref 30.0–36.0)
MCV: 97.5 fl (ref 78.0–100.0)
Platelets: 99 K/uL — ABNORMAL LOW (ref 150.0–400.0)
RBC: 4.3 Mil/uL (ref 4.22–5.81)
RDW: 14 % (ref 11.5–15.5)
WBC: 4.1 K/uL (ref 4.0–10.5)

## 2024-03-01 LAB — MICROALBUMIN / CREATININE URINE RATIO
Creatinine,U: 69.2 mg/dL
Microalb Creat Ratio: 13.3 mg/g (ref 0.0–30.0)
Microalb, Ur: 0.9 mg/dL (ref 0.0–1.9)

## 2024-03-01 LAB — HEMOGLOBIN A1C: Hgb A1c MFr Bld: 5.6 % (ref 4.6–6.5)

## 2024-03-04 ENCOUNTER — Ambulatory Visit: Payer: Self-pay | Admitting: Family Medicine

## 2024-03-04 DIAGNOSIS — E785 Hyperlipidemia, unspecified: Secondary | ICD-10-CM

## 2024-03-05 ENCOUNTER — Other Ambulatory Visit: Payer: Self-pay

## 2024-03-05 DIAGNOSIS — N400 Enlarged prostate without lower urinary tract symptoms: Secondary | ICD-10-CM

## 2024-03-05 MED ORDER — TAMSULOSIN HCL 0.4 MG PO CAPS: 0.4000 mg | ORAL_CAPSULE | Freq: Every day | ORAL | 0 refills | Status: DC

## 2024-03-05 NOTE — Telephone Encounter (Signed)
 Please advise, patient is wondering about CHL levels but lab was not ordered. Would you like patient to come in to discuss from May visit?

## 2024-03-06 NOTE — Telephone Encounter (Signed)
 Reply sent. Ok to schedule lab visit at our office or can walk-in to [facility] if you would like, if he does want to have sooner testing than next visit.  Please order lipid panel, diagnosis hyperlipidemia.  Thanks

## 2024-03-06 NOTE — Telephone Encounter (Signed)
 noted

## 2024-03-06 NOTE — Telephone Encounter (Signed)
 Ordered lipid panel and set as future order. Waiting for patient to respond. I will call him later if I have not heard back from him

## 2024-03-09 LAB — LAB REPORT - SCANNED: EGFR: 73

## 2024-03-21 ENCOUNTER — Other Ambulatory Visit: Payer: Self-pay | Admitting: Family Medicine

## 2024-03-21 DIAGNOSIS — E1165 Type 2 diabetes mellitus with hyperglycemia: Secondary | ICD-10-CM

## 2024-04-02 ENCOUNTER — Ambulatory Visit: Admitting: Family Medicine

## 2024-04-02 ENCOUNTER — Encounter: Payer: Self-pay | Admitting: Family Medicine

## 2024-04-02 VITALS — BP 148/64 | HR 67 | Temp 99.1°F | Resp 20 | Ht 75.0 in | Wt 275.6 lb

## 2024-04-02 DIAGNOSIS — I10 Essential (primary) hypertension: Secondary | ICD-10-CM | POA: Diagnosis not present

## 2024-04-02 DIAGNOSIS — E1165 Type 2 diabetes mellitus with hyperglycemia: Secondary | ICD-10-CM

## 2024-04-02 DIAGNOSIS — Z79899 Other long term (current) drug therapy: Secondary | ICD-10-CM | POA: Insufficient documentation

## 2024-04-02 DIAGNOSIS — E875 Hyperkalemia: Secondary | ICD-10-CM | POA: Diagnosis not present

## 2024-04-02 DIAGNOSIS — E785 Hyperlipidemia, unspecified: Secondary | ICD-10-CM | POA: Diagnosis not present

## 2024-04-02 NOTE — Progress Notes (Signed)
 Subjective:  Patient ID: Nathaniel Velazquez, male    DOB: [birth year]  Age: 63 y.o. MRN: [id number]  CC:  Chief Complaint  Patient presents with   Diabetes    No questions or concerns. Sugars are stable at home    HPI Nathaniel Velazquez presents for   Diabetes: With history of hyperglycemia.  See previous notes, prior diet controlled and worsening control followed by ER visit for hyperglycemia with A1c of 11.9 in August 2024.  Initially treated with Lantus  along with metformin , improving control with lowering doses of insulin .  As of August visit he was still taking insulin  10 units/day with metformin  500 mg twice daily. Still using 10units insulin  per day.  Home readings: Fasting 115-120 Postprandial up to 160. No 200's.  No sx lows.   Lab Results  Component Value Date   HGBA1C 5.6 02/29/2024   HGBA1C 5.1 10/26/2023   HGBA1C 11.9 (H) 02/24/2023   Lab Results  Component Value Date   MICROALBUR 0.9 02/29/2024   LDLCALC 154 (H) 11/28/2023   CREATININE 0.87 02/29/2024   Hypertension: Decreased control at his August visit.  Adjusted regimen to continue lisinopril  20 mg, added HCTZ 12.5 mg.  Continue same dose of Toprol .  Hyperkalemia noted on last labs, borderline, plan for repeat testing. Better readings at home - 130/67 today.  Feels better. No change in urination.  No new side effects.  BP Readings from Last 3 Encounters:  04/02/24 (!) 148/64  02/29/24 (!) 160/60  11/28/23 134/66   Lab Results  Component Value Date   CREATININE 0.87 02/29/2024   Hyperlipidemia: Last ate 1 hr ago. No current statin.  Lab Results  Component Value Date   CHOL 266 (H) 11/28/2023   HDL 61.00 11/28/2023   LDLCALC 154 (H) 11/28/2023   LDLDIRECT 91.3 06/19/2012   TRIG 256.0 (H) 11/28/2023   CHOLHDL 4 11/28/2023   Lab Results  Component Value Date   ALT 99 (H) 02/29/2024   AST 109 (H) 02/29/2024   ALKPHOS 110 02/29/2024   BILITOT 1.9 (H) 02/29/2024      History Patient Active  Problem List   Diagnosis Date Noted   Long term current use of therapeutic drug 04/02/2024   Basal cell carcinoma (BCC) 06/21/2023   Pain in joint of right ankle 06/22/2021   Pain in right ankle and joints of right foot 06/22/2021   Perianal ulcer (HCC) 12/02/2020   High risk medication use 01/14/2020   Pain in left knee 06/28/2019   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   Body mass index (BMI) 33.0-33.9, adult 09/19/2018   Family history of cardiovascular disease 09/19/2018   Moderate alcohol consumption 09/19/2018   Psoriasis 09/19/2018   Psoriatic arthritis (HCC) 09/19/2018   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   Stiffness of right shoulder joint 08/19/2017   Stiffness of right shoulder, not elsewhere classified 08/19/2017   Traumatic complete tear of rotator cuff 08/04/2017   Strain of muscle(s) and tendon(s) of the rotator cuff of right shoulder, subsequent encounter 08/04/2017   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   Dysfunction of right eustachian tube 11/18/2015   Anal fissure 07/22/2015   Hypertensive disorder 07/03/2015   Essential hypertension 07/03/2015   Obesity    Hyperlipidemia 11/21/2008   PALPITATIONS 10/30/2008   Past Medical History:  Diagnosis Date   Arthritis    Cancer (HCC)    skin cancer   GERD (gastroesophageal reflux disease)    Hypertension    Obesity  Steroid injection in rt knee   Prediabetes 03/01/2022   hgA1c was 6.1, checks cbg at home, no meds at this time   Past Surgical History:  Procedure Laterality Date   ANAL FISTULECTOMY  1987   APPENDECTOMY     COLONOSCOPY  04/08/2021   KNEE SURGERY Bilateral    meniscal tear repairs   POLYPECTOMY     ROTATOR CUFF REPAIR Bilateral 2018   TONSILLECTOMY     UPPER GASTROINTESTINAL ENDOSCOPY     Allergies  Allergen Reactions   Gabapentin  Other (See Comments)    Made very jittery   Prior to Admission medications   Medication Sig Start Date End Date Taking?  Authorizing Provider  Accu-Chek Softclix Lancets lancets USE AS INSTRUCTED TO CHECK GLUCOSE DAILY AS DIRECTED 11/02/23  Yes Levora Reyes SAUNDERS, MD  alfuzosin (UROXATRAL) 10 MG 24 hr tablet Take 10 mg by mouth daily with breakfast.   Yes [provider]  blood glucose meter kit and supplies Dispense based on patient and insurance preference. Use up to four times daily as directed. (FOR ICD-10 E10.9, E11.9).once per day fasting or 2 hours after meals. 10/15/21  Yes Levora Reyes SAUNDERS, MD  clobetasol ointment (TEMOVATE) 0.05 % as needed. As directed 05/15/12  Yes [provider]  glucose blood (ACCU-CHEK GUIDE TEST) test strip USE THREE TIMES A DAY FASTING OR 2 HOURS AFTER A MEAL 01/24/24  Yes Levora Reyes SAUNDERS, MD  golimumab (SIMPONI ARIA) 50 MG/4ML SOLN injection Inject 50 mg into the vein every 8 (eight) weeks.   Yes [provider]  insulin  glargine (LANTUS  SOLOSTAR) 100 UNIT/ML Solostar Pen Inject 20 Units into the skin daily. Patient taking differently: Inject 10 Units into the skin daily. 04/26/23  Yes Levora Reyes SAUNDERS, MD  Insulin  Pen Needle (DROPLET PEN NEEDLES) 31G X 8 MM MISC USE TO INJECT LANTUS  INTO THE SKIN AS NEEDED 03/21/24  Yes Levora Reyes SAUNDERS, MD  lisinopril -hydrochlorothiazide  (ZESTORETIC ) 20-12.5 MG tablet Take 1 tablet by mouth daily. 02/29/24  Yes Levora Reyes SAUNDERS, MD  metFORMIN  (GLUCOPHAGE ) 500 MG tablet TAKE 1 TABLET BY MOUTH 2 TIMES A DAY WITH A MEAL. START EVERY DAY FOR INITIAL WEEK AS DIRECTED 02/23/24  Yes Levora Reyes SAUNDERS, MD  metoprolol  succinate (TOPROL -XL) 25 MG 24 hr tablet Take 1 tablet (25 mg total) by mouth daily. 11/28/23  Yes Levora Reyes SAUNDERS, MD  pantoprazole  (PROTONIX ) 40 MG tablet TAKE 1 TABLET BY MOUTH DAILY 02/27/24  Yes Levora Reyes SAUNDERS, MD  predniSONE  (DELTASONE ) 5 MG tablet 2 tablets as needed for flares Orally Once a day; Duration: 30 days Patient taking differently: Take 5 mg by mouth as needed.   Yes [provider]  psyllium  (METAMUCIL) 58.6 % powder Take 1 packet by mouth 2 (two) times daily.    Yes [provider]  sildenafil  (REVATIO ) 20 MG tablet TAKE 1 TO 3 TABLETS BY MOUTH DAILY AS NEEDED 12/06/23  Yes Levora Reyes SAUNDERS, MD  valACYclovir (VALTREX) 500 MG tablet Take by mouth as needed. 02/23/21  Yes [provider]  finasteride (PROSCAR) 5 MG tablet Take 5 mg by mouth daily. Patient not taking: Reported on 04/02/2024 11/19/21   [provider]  tamsulosin  (FLOMAX ) 0.4 MG CAPS capsule Take 1 capsule (0.4 mg total) by mouth daily. Patient not taking: Reported on 04/02/2024 03/05/24   Levora Reyes SAUNDERS, MD   Social History   Socioeconomic History   Marital status: Married    Spouse name: Not on file  Number of children: Not on file   Years of education: Not on file   Highest education level: Bachelor's degree (e.g., BA, AB, BS)  Occupational History   Not on file  Tobacco Use   Smoking status: Never   Smokeless tobacco: Never  Vaping Use   Vaping status: Never Used  Substance and Sexual Activity   Alcohol use: Not Currently    Comment: occ   Drug use: Never   Sexual activity: Yes  Other Topics Concern   Not on file  Social History Narrative   Sales rep   Social Drivers of Health   Financial Resource Strain: Low Risk  (02/28/2023)   Overall Financial Resource Strain (CARDIA)    Difficulty of Paying Living Expenses: Not hard at all  Food Insecurity: No Food Insecurity (02/28/2023)   Hunger Vital Sign    Worried About Running Out of Food in the Last Year: Never true    Ran Out of Food in the Last Year: Never true  Transportation Needs: No Transportation Needs (02/28/2023)   PRAPARE - Administrator, Civil Service (Medical): No    Lack of Transportation (Non-Medical): No  Physical Activity: Unknown (02/28/2023)   Exercise Vital Sign    Days of Exercise per Week: 3 days    Minutes of Exercise per Session: Not on file  Stress: No Stress Concern Present (02/28/2023)    [employer] of Occupational Health - Occupational Stress Questionnaire    Feeling of Stress : Only a little  Social Connections: Unknown (02/28/2023)   Social Connection and Isolation Panel    Frequency of Communication with Friends and Family: Patient declined    Frequency of Social Gatherings with Friends and Family: Patient declined    Attends Religious Services: Patient declined    Database administrator or Organizations: No    Attends Engineer, structural: Not on file    Marital Status: Married  Catering manager Violence: Not on file    Review of Systems  Per HPI Objective:   Vitals:   04/02/24 1411 04/02/24 1442  BP: (!) 152/68 (!) 148/64  Pulse: 67   Resp: 20   Temp: 99.1 F (37.3 C)   TempSrc: Temporal   SpO2: 96%   Weight: 275 lb 9.6 oz (125 kg)   Height: 6' 3 (1.905 m)      Physical Exam Vitals reviewed.  Constitutional:      Appearance: He is well-developed.  HENT:     Head: Normocephalic and atraumatic.  Neck:     Vascular: No carotid bruit or JVD.  Cardiovascular:     Rate and Rhythm: Normal rate and regular rhythm.     Heart sounds: Normal heart sounds. No murmur heard. Pulmonary:     Effort: Pulmonary effort is normal.     Breath sounds: Normal breath sounds. No rales.  Musculoskeletal:     Right lower leg: No edema.     Left lower leg: No edema.  Skin:    General: Skin is warm and dry.  Neurological:     Mental Status: He is alert and oriented to person, place, and time.  Psychiatric:        Mood and Affect: Mood normal.        Assessment & Plan:  Nathaniel Velazquez is a 63 y.o. male . Type 2 diabetes mellitus with hyperglycemia, unspecified whether long term insulin  use (HCC)  - Some elevated postprandials but overall A1c well-controlled last visit.  Will  continue same dose insulin  and metformin  for now with repeat labs in 2 months.  Essential hypertension  - Much better control at home versus in office testing.   Question whitecoat hypertension component.  Will continue same regimen for now, advised to let me know if persistent readings above 130 at home and I will adjust regimen, likely increasing HCTZ dose.  No changes for now.  Hyperlipidemia, unspecified hyperlipidemia type - Plan: Comprehensive metabolic panel with GFR, Lipid panel  - Check updated labs but likely will diabetes.  No changes for now  Hyperkalemia - Plan: Comprehensive metabolic panel with GFR  - Repeat testing today without tourniquet or clenched fist to see if that may have contributed to previous mild elevations.  No orders of the defined types were placed in this encounter.  Patient Instructions  Keep a record of your blood pressures outside of the office and bring them to the next office visit.  Based on your reading at home today I think it is reasonable to hold on new meds for now but if blood pressure is persistently elevated above 130s let me know and I will adjust medicines prior to follow-up in 2 months.  If any concerns on labs I will let you know.  As we discussed I suspect we may be recommending a statin medication for cholesterol but can look at the labs first.  Take care!     Signed,   Reyes Pines, MD [facility], [facility] 04/02/24 2:46 PM

## 2024-04-02 NOTE — Patient Instructions (Addendum)
 Keep a record of your blood pressures outside of the office and bring them to the next office visit.  Based on your reading at home today I think it is reasonable to hold on new meds for now but if blood pressure is persistently elevated above 130s let me know and I will adjust medicines prior to follow-up in 2 months.  If any concerns on labs I will let you know.  As we discussed I suspect we may be recommending a statin medication for cholesterol but can look at the labs first.  Take care!  Managing Your Hypertension Hypertension, also called high blood pressure, is when the force of the blood pressing against the walls of the arteries is too strong. Arteries are blood vessels that carry blood from your heart throughout your body. Hypertension forces the heart to work harder to pump blood and may cause the arteries to become narrow or stiff. Understanding blood pressure readings A blood pressure reading includes a higher number over a lower number: The first, or top, number is called the systolic pressure. It is a measure of the pressure in your arteries as your heart beats. The second, or bottom number, is called the diastolic pressure. It is a measure of the pressure in your arteries as the heart relaxes. For most people, a normal blood pressure is below 120/80. Your personal target blood pressure may vary depending on your medical conditions, your age, and other factors. Blood pressure is classified into four stages. Based on your blood pressure reading, your health care provider may use the following stages to determine what type of treatment you need, if any. Systolic pressure and diastolic pressure are measured in a unit called millimeters of mercury (mmHg). Normal Systolic pressure: below 120. Diastolic pressure: below 80. Elevated Systolic pressure: 120-129. Diastolic pressure: below 80. Hypertension stage 1 Systolic pressure: 130-139. Diastolic pressure: 80-89. Hypertension stage  2 Systolic pressure: 140 or above. Diastolic pressure: 90 or above. How can this condition affect me? Managing your hypertension is very important. Over time, hypertension can damage the arteries and decrease blood flow to parts of the body, including the brain, heart, and kidneys. Having untreated or uncontrolled hypertension can lead to: A heart attack. A stroke. A weakened blood vessel (aneurysm). Heart failure. Kidney damage. Eye damage. Memory and concentration problems. Vascular dementia. What actions can I take to manage this condition? Hypertension can be managed by making lifestyle changes and possibly by taking medicines. Your health care provider will help you make a plan to bring your blood pressure within a normal range. You may be referred for counseling on a healthy diet and physical activity. Nutrition  Eat a diet that is high in fiber and potassium, and low in salt (sodium), added sugar, and fat. An example eating plan is called the DASH diet. DASH stands for Dietary Approaches to Stop Hypertension. To eat this way: Eat plenty of fresh fruits and vegetables. Try to fill one-half of your plate at each meal with fruits and vegetables. Eat whole grains, such as whole-wheat pasta, brown rice, or whole-grain bread. Fill about one-fourth of your plate with whole grains. Eat low-fat dairy products. Avoid fatty cuts of meat, processed or cured meats, and poultry with skin. Fill about one-fourth of your plate with lean proteins such as fish, chicken without skin, beans, eggs, and tofu. Avoid pre-made and processed foods. These tend to be higher in sodium, added sugar, and fat. Reduce your daily sodium intake. Many people with hypertension should  eat less than 1,500 mg of sodium a day. Lifestyle  Work with your health care provider to maintain a healthy body weight or to lose weight. Ask what an ideal weight is for you. Get at least 30 minutes of exercise that causes your heart to  beat faster (aerobic exercise) most days of the week. Activities may include walking, swimming, or biking. Include exercise to strengthen your muscles (resistance exercise), such as weight lifting, as part of your weekly exercise routine. Try to do these types of exercises for 30 minutes at least 3 days a week. Do not use any products that contain nicotine or tobacco. These products include cigarettes, chewing tobacco, and vaping devices, such as e-cigarettes. If you need help quitting, ask your health care provider. Control any long-term (chronic) conditions you have, such as high cholesterol or diabetes. Identify your sources of stress and find ways to manage stress. This may include meditation, deep breathing, or making time for fun activities. Alcohol use Do not drink alcohol if: Your health care provider tells you not to drink. You are pregnant, may be pregnant, or are planning to become pregnant. If you drink alcohol: Limit how much you have to: 0-1 drink a day for women. 0-2 drinks a day for men. Know how much alcohol is in your drink. In the [place] one drink equals one 12 oz bottle of beer (355 mL), one 5 oz glass of wine (148 mL), or one 1 oz glass of hard liquor (44 mL). Medicines Your health care provider may prescribe medicine if lifestyle changes are not enough to get your blood pressure under control and if: Your systolic blood pressure is 130 or higher. Your diastolic blood pressure is 80 or higher. Take medicines only as told by your health care provider. Follow the directions carefully. Blood pressure medicines must be taken as told by your health care provider. The medicine does not work as well when you skip doses. Skipping doses also puts you at risk for problems. Monitoring Before you monitor your blood pressure: Do not smoke, drink caffeinated beverages, or exercise within 30 minutes before taking a measurement. Use the bathroom and empty your bladder (urinate). Sit  quietly for at least 5 minutes before taking measurements. Monitor your blood pressure at home as told by your health care provider. To do this: Sit with your back straight and supported. Place your feet flat on the floor. Do not cross your legs. Support your arm on a flat surface, such as a table. Make sure your upper arm is at heart level. Each time you measure, take two or three readings one minute apart and record the results. You may also need to have your blood pressure checked regularly by your health care provider. General information Talk with your health care provider about your diet, exercise habits, and other lifestyle factors that may be contributing to hypertension. Review all the medicines you take with your health care provider because there may be side effects or interactions. Keep all follow-up visits. Your health care provider can help you create and adjust your plan for managing your high blood pressure. Where to find more information National Heart, Lung, and Blood Institute: [employer] American Heart Association: www.heart.org Contact a health care provider if: You think you are having a reaction to medicines you have taken. You have repeated (recurrent) headaches. You feel dizzy. You have swelling in your ankles. You have trouble with your vision. Get help right away if: You develop a severe headache or  confusion. You have unusual weakness or numbness, or you feel faint. You have severe pain in your chest or abdomen. You vomit repeatedly. You have trouble breathing. These symptoms may be an emergency. Get help right away. Call 911. Do not wait to see if the symptoms will go away. Do not drive yourself to the hospital. Summary Hypertension is when the force of blood pumping through your arteries is too strong. If this condition is not controlled, it may put you at risk for serious complications. Your personal target blood pressure may vary depending on your  medical conditions, your age, and other factors. For most people, a normal blood pressure is less than 120/80. Hypertension is managed by lifestyle changes, medicines, or both. Lifestyle changes to help manage hypertension include losing weight, eating a healthy, low-sodium diet, exercising more, stopping smoking, and limiting alcohol. This information is not intended to replace advice given to you by your health care provider. Make sure you discuss any questions you have with your health care provider. Document Revised: 03/19/2021 Document Reviewed: 03/19/2021 Elsevier Patient Education  2024 [employer].

## 2024-04-03 LAB — COMPREHENSIVE METABOLIC PANEL WITH GFR
ALT: 88 U/L — ABNORMAL HIGH (ref 0–53)
AST: 85 U/L — ABNORMAL HIGH (ref 0–37)
Albumin: 4.4 g/dL (ref 3.5–5.2)
Alkaline Phosphatase: 81 U/L (ref 39–117)
BUN: 21 mg/dL (ref 6–23)
CO2: 21 meq/L (ref 19–32)
Calcium: 10.2 mg/dL (ref 8.4–10.5)
Chloride: 100 meq/L (ref 96–112)
Creatinine, Ser: 0.87 mg/dL (ref 0.40–1.50)
GFR: 91.39 mL/min (ref >60.00)
Glucose, Bld: 162 mg/dL — ABNORMAL HIGH (ref 70–99)
Potassium: 4.9 meq/L (ref 3.5–5.1)
Sodium: 132 meq/L — ABNORMAL LOW (ref 135–145)
Total Bilirubin: 1.1 mg/dL (ref 0.2–1.2)
Total Protein: 7.6 g/dL (ref 6.0–8.3)

## 2024-04-03 LAB — LIPID PANEL
Cholesterol: 252 mg/dL — ABNORMAL HIGH (ref 0–200)
HDL: 62 mg/dL (ref >39.00)
LDL Cholesterol: 144 mg/dL — ABNORMAL HIGH (ref 0–99)
NonHDL: 190.28
Total CHOL/HDL Ratio: 4
Triglycerides: 233 mg/dL — ABNORMAL HIGH (ref 0.0–149.0)
VLDL: 46.6 mg/dL — ABNORMAL HIGH (ref 0.0–40.0)

## 2024-04-05 ENCOUNTER — Other Ambulatory Visit: Payer: Self-pay

## 2024-04-06 ENCOUNTER — Ambulatory Visit: Payer: Self-pay | Admitting: Family Medicine

## 2024-04-06 DIAGNOSIS — E785 Hyperlipidemia, unspecified: Secondary | ICD-10-CM

## 2024-04-06 DIAGNOSIS — R7989 Other specified abnormal findings of blood chemistry: Secondary | ICD-10-CM

## 2024-04-09 MED ORDER — ATORVASTATIN CALCIUM 10 MG PO TABS: 10.0000 mg | ORAL_TABLET | Freq: Every day | ORAL | 0 refills | Status: DC

## 2024-04-09 NOTE — Telephone Encounter (Signed)
 Lipitor sent to pharmacy.  I did order hepatic function panel to recheck liver function test.  Should recheck that with a lab only visit in the next 2 to 3 weeks, and also follow-up with gastroenterology.  Let me know if there are questions.

## 2024-04-09 NOTE — Telephone Encounter (Signed)
 Patient is okay with you sending the cholesterol medication in to [facility] [address]. Would you like patient to repeat labs here with us  and if so which labs and DX code?

## 2024-04-10 NOTE — Telephone Encounter (Signed)
 Called patient to schedule 2-3 week lab only visit. Left VM to return call to schedule this and to let him know Dr.Greene did send in the cholesterol medication as well.

## 2024-04-11 ENCOUNTER — Telehealth: Payer: Self-pay

## 2024-04-11 NOTE — Telephone Encounter (Signed)
 Copied from CRM #[id number]. Topic: Appointments - Appointment Scheduling >> Apr 11, 2024  2:59 PM [facility] HERO wrote: Patient/patient representative is calling to schedule an appointment. Refer to attachments for appointment information. Also patient wants ton know if he has to fast prior to having labs drawn.SABRA

## 2024-04-11 NOTE — Telephone Encounter (Signed)
 Patient has been called and asked to fast for 4 hours prior to his appointment and he was in agreement

## 2024-04-12 NOTE — Telephone Encounter (Signed)
 Called patient again to schedule lab only visit in 2-3 weeks from the 19th of september, Left VM to return call to schedule.   Looking in patient chart there was a phone note created and not placed in this thread that was patient returning call to schedule lab only visit, asking if he needed to fast and was told to fast 4 hours prior to labs but lab visit was not scheduled.

## 2024-05-02 ENCOUNTER — Ambulatory Visit: Admitting: Family Medicine

## 2024-05-07 ENCOUNTER — Other Ambulatory Visit: Payer: Self-pay | Admitting: Family Medicine

## 2024-05-07 DIAGNOSIS — E1165 Type 2 diabetes mellitus with hyperglycemia: Secondary | ICD-10-CM

## 2024-05-09 ENCOUNTER — Encounter: Payer: Self-pay | Admitting: Family Medicine

## 2024-05-09 ENCOUNTER — Ambulatory Visit: Admitting: Family Medicine

## 2024-05-09 VITALS — BP 138/58 | HR 72 | Temp 98.4°F | Wt 274.4 lb

## 2024-05-09 DIAGNOSIS — I10 Essential (primary) hypertension: Secondary | ICD-10-CM | POA: Diagnosis not present

## 2024-05-09 DIAGNOSIS — E1165 Type 2 diabetes mellitus with hyperglycemia: Secondary | ICD-10-CM

## 2024-05-09 DIAGNOSIS — E785 Hyperlipidemia, unspecified: Secondary | ICD-10-CM | POA: Diagnosis not present

## 2024-05-09 DIAGNOSIS — Z23 Encounter for immunization: Secondary | ICD-10-CM

## 2024-05-09 NOTE — Patient Instructions (Addendum)
 It does appear that you are due for repeat colonoscopy. Please call [facility] to schedule: [phone]  See information below about managing high blood pressure.  If you consistently have readings above 130 on the top number at home I think we could increase the HCTZ combination in your pill for improved blood pressure control.  Let me know.    Lab visit in 1 months, fasting for 6 hours prior to those labs if possible to recheck your cholesterol and we will recheck your hemoglobin A1c at that time.  If the A1c still looks good, I think we could try to come off the insulin  and just monitor blood sugars on metformin  alone along with watching diet and exercise.  Recheck with me in 4 months.  Depending on cholesterol levels we can adjust meds as well.  Glad to hear you are doing well with that medication.  Take care!  Managing Your Hypertension Hypertension, also called high blood pressure, is when the force of the blood pressing against the walls of the arteries is too strong. Arteries are blood vessels that carry blood from your heart throughout your body. Hypertension forces the heart to work harder to pump blood and may cause the arteries to become narrow or stiff. Understanding blood pressure readings A blood pressure reading includes a higher number over a lower number: The first, or top, number is called the systolic pressure. It is a measure of the pressure in your arteries as your heart beats. The second, or bottom number, is called the diastolic pressure. It is a measure of the pressure in your arteries as the heart relaxes. For most people, a normal blood pressure is below 120/80. Your personal target blood pressure may vary depending on your medical conditions, your age, and other factors. Blood pressure is classified into four stages. Based on your blood pressure reading, your health care provider may use the following stages to determine what type of treatment you need, if any. Systolic  pressure and diastolic pressure are measured in a unit called millimeters of mercury (mmHg). Normal Systolic pressure: below 120. Diastolic pressure: below 80. Elevated Systolic pressure: 120-129. Diastolic pressure: below 80. Hypertension stage 1 Systolic pressure: 130-139. Diastolic pressure: 80-89. Hypertension stage 2 Systolic pressure: 140 or above. Diastolic pressure: 90 or above. How can this condition affect me? Managing your hypertension is very important. Over time, hypertension can damage the arteries and decrease blood flow to parts of the body, including the brain, heart, and kidneys. Having untreated or uncontrolled hypertension can lead to: A heart attack. A stroke. A weakened blood vessel (aneurysm). Heart failure. Kidney damage. Eye damage. Memory and concentration problems. Vascular dementia. What actions can I take to manage this condition? Hypertension can be managed by making lifestyle changes and possibly by taking medicines. Your health care provider will help you make a plan to bring your blood pressure within a normal range. You may be referred for counseling on a healthy diet and physical activity. Nutrition  Eat a diet that is high in fiber and potassium, and low in salt (sodium), added sugar, and fat. An example eating plan is called the DASH diet. DASH stands for Dietary Approaches to Stop Hypertension. To eat this way: Eat plenty of fresh fruits and vegetables. Try to fill one-half of your plate at each meal with fruits and vegetables. Eat whole grains, such as whole-wheat pasta, brown rice, or whole-grain bread. Fill about one-fourth of your plate with whole grains. Eat low-fat dairy products. Avoid  fatty cuts of meat, processed or cured meats, and poultry with skin. Fill about one-fourth of your plate with lean proteins such as fish, chicken without skin, beans, eggs, and tofu. Avoid pre-made and processed foods. These tend to be higher in sodium, added  sugar, and fat. Reduce your daily sodium intake. Many people with hypertension should eat less than 1,500 mg of sodium a day. Lifestyle  Work with your health care provider to maintain a healthy body weight or to lose weight. Ask what an ideal weight is for you. Get at least 30 minutes of exercise that causes your heart to beat faster (aerobic exercise) most days of the week. Activities may include walking, swimming, or biking. Include exercise to strengthen your muscles (resistance exercise), such as weight lifting, as part of your weekly exercise routine. Try to do these types of exercises for 30 minutes at least 3 days a week. Do not use any products that contain nicotine or tobacco. These products include cigarettes, chewing tobacco, and vaping devices, such as e-cigarettes. If you need help quitting, ask your health care provider. Control any long-term (chronic) conditions you have, such as high cholesterol or diabetes. Identify your sources of stress and find ways to manage stress. This may include meditation, deep breathing, or making time for fun activities. Alcohol use Do not drink alcohol if: Your health care provider tells you not to drink. You are pregnant, may be pregnant, or are planning to become pregnant. If you drink alcohol: Limit how much you have to: 0-1 drink a day for women. 0-2 drinks a day for men. Know how much alcohol is in your drink. In the [place] one drink equals one 12 oz bottle of beer (355 mL), one 5 oz glass of wine (148 mL), or one 1 oz glass of hard liquor (44 mL). Medicines Your health care provider may prescribe medicine if lifestyle changes are not enough to get your blood pressure under control and if: Your systolic blood pressure is 130 or higher. Your diastolic blood pressure is 80 or higher. Take medicines only as told by your health care provider. Follow the directions carefully. Blood pressure medicines must be taken as told by your health care  provider. The medicine does not work as well when you skip doses. Skipping doses also puts you at risk for problems. Monitoring Before you monitor your blood pressure: Do not smoke, drink caffeinated beverages, or exercise within 30 minutes before taking a measurement. Use the bathroom and empty your bladder (urinate). Sit quietly for at least 5 minutes before taking measurements. Monitor your blood pressure at home as told by your health care provider. To do this: Sit with your back straight and supported. Place your feet flat on the floor. Do not cross your legs. Support your arm on a flat surface, such as a table. Make sure your upper arm is at heart level. Each time you measure, take two or three readings one minute apart and record the results. You may also need to have your blood pressure checked regularly by your health care provider. General information Talk with your health care provider about your diet, exercise habits, and other lifestyle factors that may be contributing to hypertension. Review all the medicines you take with your health care provider because there may be side effects or interactions. Keep all follow-up visits. Your health care provider can help you create and adjust your plan for managing your high blood pressure. Where to find more information National Heart, Lung,  and Blood Institute: [employer]is American Heart Association: www.heart.org Contact a health care provider if: You think you are having a reaction to medicines you have taken. You have repeated (recurrent) headaches. You feel dizzy. You have swelling in your ankles. You have trouble with your vision. Get help right away if: You develop a severe headache or confusion. You have unusual weakness or numbness, or you feel faint. You have severe pain in your chest or abdomen. You vomit repeatedly. You have trouble breathing. These symptoms may be an emergency. Get help right away. Call 911. Do  not wait to see if the symptoms will go away. Do not drive yourself to the hospital. Summary Hypertension is when the force of blood pumping through your arteries is too strong. If this condition is not controlled, it may put you at risk for serious complications. Your personal target blood pressure may vary depending on your medical conditions, your age, and other factors. For most people, a normal blood pressure is less than 120/80. Hypertension is managed by lifestyle changes, medicines, or both. Lifestyle changes to help manage hypertension include losing weight, eating a healthy, low-sodium diet, exercising more, stopping smoking, and limiting alcohol. This information is not intended to replace advice given to you by your health care provider. Make sure you discuss any questions you have with your health care provider. Document Revised: 03/19/2021 Document Reviewed: 03/19/2021 Elsevier Patient Education  2024 [employer].

## 2024-05-09 NOTE — Progress Notes (Signed)
 Subjective:  Patient ID: Nathaniel Velazquez, male    DOB: 07-30-1960  Age: 63 y.o. MRN: [id number]  CC:  Chief Complaint  Patient presents with   Medical Management of Chronic Issues    HPI Nathaniel Velazquez presents for   Diabetes: With history of hyperglycemia, then significant improvement initially with Lantus , metformin  and lowering doses of insulin .  On insulin  10 units/day at his September visit with well-controlled A1c at 5.6 in August. On 10u insulin , metformin  500mg  BID> no side effects.  Home readings fasting: 117-130, 103 3hr PP = 132 No sx lows.  Microalbumin: Normal ratio 02/29/2024 Optho, foot exam, pneumovax: Up-to-date He is due for colon cancer screening.  Colonoscopy 04/08/2021, repeat 3 years, adenomatous polyps.  Dr. Teressa.  Lab Results  Component Value Date   HGBA1C 5.6 02/29/2024   HGBA1C 5.1 10/26/2023   HGBA1C 11.9 (H) 02/24/2023   Lab Results  Component Value Date   MICROALBUR 0.9 02/29/2024   LDLCALC 144 (H) 04/02/2024   CREATININE 0.87 04/02/2024   Hypertension: Decreased control in August, added HCTZ 12.5 mg to his lisinopril  20 mg and Toprol  25mg  every day.  Improved at his September visit, with better readings at home than in office, suspected whitecoat hypertension component.  Option of increasing HCTZ dose if elevated home readings with close monitoring. No urinary side effects.  Home readings: 120-130's/70's. systolic 155 prior to infusion at rheumatology, 121 after infusion - 30 min weight time.  BP Readings from Last 3 Encounters:  05/09/24 (!) 138/58  04/02/24 (!) 148/64  02/29/24 (!) 160/60   Lab Results  Component Value Date   CREATININE 0.87 04/02/2024    Hyperlipidemia: Elevated levels at last visit but was not fasting at that time.  Elevated ASCVD risk score 32%, along with diabetes, statin recommended. Lipitor 10 mg daily prescribed. On past month - no myalgia/side effects.   Lab Results  Component Value Date   CHOL 252 (H)  04/02/2024   HDL 62.00 04/02/2024   LDLCALC 144 (H) 04/02/2024   LDLDIRECT 91.3 06/19/2012   TRIG 233.0 (H) 04/02/2024   CHOLHDL 4 04/02/2024   Lab Results  Component Value Date   ALT 88 (H) 04/02/2024   AST 85 (H) 04/02/2024   ALKPHOS 81 04/02/2024   BILITOT 1.1 04/02/2024      History Patient Active Problem List   Diagnosis Date Noted   Long term current use of therapeutic drug 04/02/2024   Basal cell carcinoma (BCC) 06/21/2023   Pain in joint of right ankle 06/22/2021   Pain in right ankle and joints of right foot 06/22/2021   Perianal ulcer (HCC) 12/02/2020   High risk medication use 01/14/2020   Pain in left knee 06/28/2019   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   Body mass index (BMI) 33.0-33.9, adult 09/19/2018   Family history of cardiovascular disease 09/19/2018   Moderate alcohol consumption 09/19/2018   Psoriasis 09/19/2018   Psoriatic arthritis (HCC) 09/19/2018   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   Stiffness of right shoulder joint 08/19/2017   Stiffness of right shoulder, not elsewhere classified 08/19/2017   Traumatic complete tear of rotator cuff 08/04/2017   Strain of muscle(s) and tendon(s) of the rotator cuff of right shoulder, subsequent encounter 08/04/2017   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   Dysfunction of right eustachian tube 11/18/2015   Anal fissure 07/22/2015   Hypertensive disorder 07/03/2015   Essential hypertension 07/03/2015   Obesity    Hyperlipidemia 11/21/2008   PALPITATIONS  10/30/2008   Past Medical History:  Diagnosis Date   Arthritis    Cancer (HCC)    skin cancer   GERD (gastroesophageal reflux disease)    Hypertension    Obesity    Steroid injection in rt knee   Prediabetes 03/01/2022   hgA1c was 6.1, checks cbg at home, no meds at this time   Past Surgical History:  Procedure Laterality Date   ANAL FISTULECTOMY  1987   APPENDECTOMY     COLONOSCOPY  04/08/2021   KNEE SURGERY  Bilateral    meniscal tear repairs   POLYPECTOMY     ROTATOR CUFF REPAIR Bilateral 2018   TONSILLECTOMY     UPPER GASTROINTESTINAL ENDOSCOPY     Allergies  Allergen Reactions   Gabapentin  Other (See Comments)    Made very jittery   Prior to Admission medications   Medication Sig Start Date End Date Taking? Authorizing Provider  Accu-Chek Softclix Lancets lancets USE AS INSTRUCTED TO CHECK GLUCOSE DAILY AS DIRECTED 11/02/23  Yes Levora Reyes SAUNDERS, MD  alfuzosin (UROXATRAL) 10 MG 24 hr tablet Take 10 mg by mouth daily with breakfast.   Yes [provider]  atorvastatin  (LIPITOR) 10 MG tablet Take 1 tablet (10 mg total) by mouth daily. 04/09/24  Yes Levora Reyes SAUNDERS, MD  blood glucose meter kit and supplies Dispense based on patient and insurance preference. Use up to four times daily as directed. (FOR ICD-10 E10.9, E11.9).once per day fasting or 2 hours after meals. 10/15/21  Yes Levora Reyes SAUNDERS, MD  clobetasol ointment (TEMOVATE) 0.05 % as needed. As directed 05/15/12  Yes [provider]  glucose blood (ACCU-CHEK GUIDE TEST) test strip USE THREE TIMES A DAY FASTING OR 2 HOURS AFTER A MEAL 01/24/24  Yes Levora Reyes SAUNDERS, MD  golimumab (SIMPONI ARIA) 50 MG/4ML SOLN injection Inject 50 mg into the vein every 8 (eight) weeks.   Yes [provider]  insulin  glargine (LANTUS  SOLOSTAR) 100 UNIT/ML Solostar Pen INJECT 20 UNITS UNDER THE SKIN DAILY 05/07/24  Yes Levora Reyes SAUNDERS, MD  Insulin  Pen Needle (DROPLET PEN NEEDLES) 31G X 8 MM MISC USE TO INJECT LANTUS  INTO THE SKIN AS NEEDED 03/21/24  Yes Levora Reyes SAUNDERS, MD  lisinopril -hydrochlorothiazide  (ZESTORETIC ) 20-12.5 MG tablet Take 1 tablet by mouth daily. 02/29/24  Yes Levora Reyes SAUNDERS, MD  metFORMIN  (GLUCOPHAGE ) 500 MG tablet TAKE 1 TABLET BY MOUTH 2 TIMES A DAY WITH A MEAL. START EVERY DAY FOR INITIAL WEEK AS DIRECTED 02/23/24  Yes Levora Reyes SAUNDERS, MD  metoprolol  succinate (TOPROL -XL) 25 MG 24 hr tablet Take 1 tablet  (25 mg total) by mouth daily. 11/28/23  Yes Levora Reyes SAUNDERS, MD  pantoprazole  (PROTONIX ) 40 MG tablet TAKE 1 TABLET BY MOUTH DAILY 02/27/24  Yes Levora Reyes SAUNDERS, MD  predniSONE  (DELTASONE ) 5 MG tablet 2 tablets as needed for flares Orally Once a day; Duration: 30 days Patient taking differently: Take 5 mg by mouth as needed.   Yes [provider]  psyllium (METAMUCIL) 58.6 % powder Take 1 packet by mouth 2 (two) times daily.    Yes [provider]  sildenafil  (REVATIO ) 20 MG tablet TAKE 1 TO 3 TABLETS BY MOUTH DAILY AS NEEDED 12/06/23  Yes Levora Reyes SAUNDERS, MD  valACYclovir (VALTREX) 500 MG tablet Take by mouth as needed. 02/23/21  Yes [provider]  finasteride (PROSCAR) 5 MG tablet Take 5 mg by mouth daily. Patient not taking: Reported on 05/09/2024 11/19/21   [provider]  Social History   Socioeconomic History   Marital status: Married    Spouse name: Not on file   Number of children: Not on file   Years of education: Not on file   Highest education level: Bachelor's degree (e.g., BA, AB, BS)  Occupational History   Not on file  Tobacco Use   Smoking status: Never   Smokeless tobacco: Never  Vaping Use   Vaping status: Never Used  Substance and Sexual Activity   Alcohol use: Not Currently    Comment: occ   Drug use: Never   Sexual activity: Yes  Other Topics Concern   Not on file  Social History Narrative   Sales rep   Social Drivers of Health   Financial Resource Strain: Low Risk  (02/28/2023)   Overall Financial Resource Strain (CARDIA)    Difficulty of Paying Living Expenses: Not hard at all  Food Insecurity: No Food Insecurity (02/28/2023)   Hunger Vital Sign    Worried About Running Out of Food in the Last Year: Never true    Ran Out of Food in the Last Year: Never true  Transportation Needs: No Transportation Needs (02/28/2023)   PRAPARE - Administrator, Civil Service (Medical): No    Lack of Transportation  (Non-Medical): No  Physical Activity: Unknown (02/28/2023)   Exercise Vital Sign    Days of Exercise per Week: 3 days    Minutes of Exercise per Session: Not on file  Stress: No Stress Concern Present (02/28/2023)   [employer] of Occupational Health - Occupational Stress Questionnaire    Feeling of Stress : Only a little  Social Connections: Unknown (02/28/2023)   Social Connection and Isolation Panel    Frequency of Communication with Friends and Family: Patient declined    Frequency of Social Gatherings with Friends and Family: Patient declined    Attends Religious Services: Patient declined    Database administrator or Organizations: No    Attends Engineer, structural: Not on file    Marital Status: Married  Catering manager Violence: Not on file    Review of Systems  Constitutional:  Negative for fatigue and unexpected weight change.  Eyes:  Negative for visual disturbance.  Respiratory:  Negative for cough, chest tightness and shortness of breath.   Cardiovascular:  Negative for chest pain, palpitations and leg swelling.  Gastrointestinal:  Negative for abdominal pain and blood in stool.  Neurological:  Negative for dizziness, light-headedness and headaches.     Objective:   Vitals:   05/09/24 1526 05/09/24 1541 05/09/24 1542  BP: (!) 180/82 120/60 (!) 138/58  Pulse: 72    Temp: 98.4 F (36.9 C)    SpO2: 97%    Weight: 274 lb 6.4 oz (124.5 kg)       Physical Exam Vitals reviewed.  Constitutional:      Appearance: He is well-developed.  HENT:     Head: Normocephalic and atraumatic.  Neck:     Vascular: No carotid bruit or JVD.  Cardiovascular:     Rate and Rhythm: Normal rate and regular rhythm.     Heart sounds: Normal heart sounds. No murmur heard. Pulmonary:     Effort: Pulmonary effort is normal.     Breath sounds: Normal breath sounds. No rales.  Musculoskeletal:     Right lower leg: No edema.     Left lower leg: No edema.  Skin:     General: Skin is warm and dry.  Neurological:  Mental Status: He is alert and oriented to person, place, and time.  Psychiatric:        Mood and Affect: Mood normal.        Assessment & Plan:  Nathaniel Velazquez is a 63 y.o. male . Type 2 diabetes mellitus with hyperglycemia, unspecified whether long term insulin  use (HCC) - Plan: Hemoglobin A1c  - Lab only visit in 1 month for repeat A1c and at that time could consider coming off insulin  if A1c is stable and monitoring home glycemic readings on metformin  alone since he is on a fairly low dose of insulin  at this time.  Need for influenza vaccination - Plan: Flu vaccine trivalent PF, 6mos and older(Flulaval,Afluria,Fluarix,Fluzone) flu vaccine given.  Hyperlipidemia, unspecified hyperlipidemia type - Plan: Comprehensive metabolic panel with GFR, Lipid panel  - Check labs and adjust plan accordingly, no med changes for now.  1 month follow-up and can adjust Lipitor dosing at that time if needed.  Tolerating current regimen.  Essential hypertension - Plan: Comprehensive metabolic panel with GFR  - Borderline elevated, but suspect component of whitecoat hypertension with lower home readings.  Advised to continue to monitor and if systolics remain above 130 could consider increasing HCTZ to 25 mg daily.  He will let me know.  4 months recheck, 1 month lab visit.  No orders of the defined types were placed in this encounter.  Patient Instructions  It does appear that you are due for repeat colonoscopy. Please call [facility] to schedule: [phone]  See information below about managing high blood pressure.  If you consistently have readings above 130 on the top number at home I think we could increase the HCTZ combination in your pill for improved blood pressure control.  Let me know.    Lab visit in 1 months, fasting for 6 hours prior to those labs if possible to recheck your cholesterol and we will recheck your hemoglobin A1c at  that time.  If the A1c still looks good, I think we could try to come off the insulin  and just monitor blood sugars on metformin  alone along with watching diet and exercise.  Recheck with me in 4 months.  Depending on cholesterol levels we can adjust meds as well.  Glad to hear you are doing well with that medication.  Take care!  Managing Your Hypertension Hypertension, also called high blood pressure, is when the force of the blood pressing against the walls of the arteries is too strong. Arteries are blood vessels that carry blood from your heart throughout your body. Hypertension forces the heart to work harder to pump blood and may cause the arteries to become narrow or stiff. Understanding blood pressure readings A blood pressure reading includes a higher number over a lower number: The first, or top, number is called the systolic pressure. It is a measure of the pressure in your arteries as your heart beats. The second, or bottom number, is called the diastolic pressure. It is a measure of the pressure in your arteries as the heart relaxes. For most people, a normal blood pressure is below 120/80. Your personal target blood pressure may vary depending on your medical conditions, your age, and other factors. Blood pressure is classified into four stages. Based on your blood pressure reading, your health care provider may use the following stages to determine what type of treatment you need, if any. Systolic pressure and diastolic pressure are measured in a unit called millimeters of mercury (mmHg). Normal Systolic  pressure: below 120. Diastolic pressure: below 80. Elevated Systolic pressure: 120-129. Diastolic pressure: below 80. Hypertension stage 1 Systolic pressure: 130-139. Diastolic pressure: 80-89. Hypertension stage 2 Systolic pressure: 140 or above. Diastolic pressure: 90 or above. How can this condition affect me? Managing your hypertension is very important. Over time,  hypertension can damage the arteries and decrease blood flow to parts of the body, including the brain, heart, and kidneys. Having untreated or uncontrolled hypertension can lead to: A heart attack. A stroke. A weakened blood vessel (aneurysm). Heart failure. Kidney damage. Eye damage. Memory and concentration problems. Vascular dementia. What actions can I take to manage this condition? Hypertension can be managed by making lifestyle changes and possibly by taking medicines. Your health care provider will help you make a plan to bring your blood pressure within a normal range. You may be referred for counseling on a healthy diet and physical activity. Nutrition  Eat a diet that is high in fiber and potassium, and low in salt (sodium), added sugar, and fat. An example eating plan is called the DASH diet. DASH stands for Dietary Approaches to Stop Hypertension. To eat this way: Eat plenty of fresh fruits and vegetables. Try to fill one-half of your plate at each meal with fruits and vegetables. Eat whole grains, such as whole-wheat pasta, brown rice, or whole-grain bread. Fill about one-fourth of your plate with whole grains. Eat low-fat dairy products. Avoid fatty cuts of meat, processed or cured meats, and poultry with skin. Fill about one-fourth of your plate with lean proteins such as fish, chicken without skin, beans, eggs, and tofu. Avoid pre-made and processed foods. These tend to be higher in sodium, added sugar, and fat. Reduce your daily sodium intake. Many people with hypertension should eat less than 1,500 mg of sodium a day. Lifestyle  Work with your health care provider to maintain a healthy body weight or to lose weight. Ask what an ideal weight is for you. Get at least 30 minutes of exercise that causes your heart to beat faster (aerobic exercise) most days of the week. Activities may include walking, swimming, or biking. Include exercise to strengthen your muscles (resistance  exercise), such as weight lifting, as part of your weekly exercise routine. Try to do these types of exercises for 30 minutes at least 3 days a week. Do not use any products that contain nicotine or tobacco. These products include cigarettes, chewing tobacco, and vaping devices, such as e-cigarettes. If you need help quitting, ask your health care provider. Control any long-term (chronic) conditions you have, such as high cholesterol or diabetes. Identify your sources of stress and find ways to manage stress. This may include meditation, deep breathing, or making time for fun activities. Alcohol use Do not drink alcohol if: Your health care provider tells you not to drink. You are pregnant, may be pregnant, or are planning to become pregnant. If you drink alcohol: Limit how much you have to: 0-1 drink a day for women. 0-2 drinks a day for men. Know how much alcohol is in your drink. In the [place] one drink equals one 12 oz bottle of beer (355 mL), one 5 oz glass of wine (148 mL), or one 1 oz glass of hard liquor (44 mL). Medicines Your health care provider may prescribe medicine if lifestyle changes are not enough to get your blood pressure under control and if: Your systolic blood pressure is 130 or higher. Your diastolic blood pressure is 80 or higher. Take medicines  only as told by your health care provider. Follow the directions carefully. Blood pressure medicines must be taken as told by your health care provider. The medicine does not work as well when you skip doses. Skipping doses also puts you at risk for problems. Monitoring Before you monitor your blood pressure: Do not smoke, drink caffeinated beverages, or exercise within 30 minutes before taking a measurement. Use the bathroom and empty your bladder (urinate). Sit quietly for at least 5 minutes before taking measurements. Monitor your blood pressure at home as told by your health care provider. To do this: Sit with your back  straight and supported. Place your feet flat on the floor. Do not cross your legs. Support your arm on a flat surface, such as a table. Make sure your upper arm is at heart level. Each time you measure, take two or three readings one minute apart and record the results. You may also need to have your blood pressure checked regularly by your health care provider. General information Talk with your health care provider about your diet, exercise habits, and other lifestyle factors that may be contributing to hypertension. Review all the medicines you take with your health care provider because there may be side effects or interactions. Keep all follow-up visits. Your health care provider can help you create and adjust your plan for managing your high blood pressure. Where to find more information National Heart, Lung, and Blood Institute: PopSteam.is American Heart Association: www.heart.org Contact a health care provider if: You think you are having a reaction to medicines you have taken. You have repeated (recurrent) headaches. You feel dizzy. You have swelling in your ankles. You have trouble with your vision. Get help right away if: You develop a severe headache or confusion. You have unusual weakness or numbness, or you feel faint. You have severe pain in your chest or abdomen. You vomit repeatedly. You have trouble breathing. These symptoms may be an emergency. Get help right away. Call 911. Do not wait to see if the symptoms will go away. Do not drive yourself to the hospital. Summary Hypertension is when the force of blood pumping through your arteries is too strong. If this condition is not controlled, it may put you at risk for serious complications. Your personal target blood pressure may vary depending on your medical conditions, your age, and other factors. For most people, a normal blood pressure is less than 120/80. Hypertension is managed by lifestyle changes,  medicines, or both. Lifestyle changes to help manage hypertension include losing weight, eating a healthy, low-sodium diet, exercising more, stopping smoking, and limiting alcohol. This information is not intended to replace advice given to you by your health care provider. Make sure you discuss any questions you have with your health care provider. Document Revised: 03/19/2021 Document Reviewed: 03/19/2021 Elsevier Patient Education  2024 Elsevier Inc.      Signed,   Reyes Pines, MD [facility], [facility] 05/09/24 4:14 PM

## 2024-06-11 ENCOUNTER — Other Ambulatory Visit

## 2024-06-11 DIAGNOSIS — E1165 Type 2 diabetes mellitus with hyperglycemia: Secondary | ICD-10-CM | POA: Diagnosis not present

## 2024-06-11 DIAGNOSIS — R7989 Other specified abnormal findings of blood chemistry: Secondary | ICD-10-CM

## 2024-06-11 DIAGNOSIS — I10 Essential (primary) hypertension: Secondary | ICD-10-CM

## 2024-06-11 DIAGNOSIS — E785 Hyperlipidemia, unspecified: Secondary | ICD-10-CM

## 2024-06-11 LAB — COMPREHENSIVE METABOLIC PANEL WITH GFR
ALT: 79 U/L — ABNORMAL HIGH (ref 0–53)
AST: 83 U/L — ABNORMAL HIGH (ref 0–37)
Albumin: 4.3 g/dL (ref 3.5–5.2)
Alkaline Phosphatase: 108 U/L (ref 39–117)
BUN: 22 mg/dL (ref 6–23)
CO2: 26 meq/L (ref 19–32)
Calcium: 9.6 mg/dL (ref 8.4–10.5)
Chloride: 98 meq/L (ref 96–112)
Creatinine, Ser: 0.87 mg/dL (ref 0.40–1.50)
GFR: 91.27 mL/min (ref >60.00)
Glucose, Bld: 136 mg/dL — ABNORMAL HIGH (ref 70–99)
Potassium: 4.7 meq/L (ref 3.5–5.1)
Sodium: 131 meq/L — ABNORMAL LOW (ref 135–145)
Total Bilirubin: 1.6 mg/dL — ABNORMAL HIGH (ref 0.2–1.2)
Total Protein: 7.4 g/dL (ref 6.0–8.3)

## 2024-06-11 LAB — LIPID PANEL
Cholesterol: 193 mg/dL (ref 0–200)
HDL: 58.8 mg/dL (ref >39.00)
LDL Cholesterol: 103 mg/dL — ABNORMAL HIGH (ref 0–99)
NonHDL: 133.79
Total CHOL/HDL Ratio: 3
Triglycerides: 153 mg/dL — ABNORMAL HIGH (ref 0.0–149.0)
VLDL: 30.6 mg/dL (ref 0.0–40.0)

## 2024-06-11 LAB — HEPATIC FUNCTION PANEL
ALT: 79 U/L — ABNORMAL HIGH (ref 0–53)
AST: 83 U/L — ABNORMAL HIGH (ref 0–37)
Albumin: 4.3 g/dL (ref 3.5–5.2)
Alkaline Phosphatase: 108 U/L (ref 39–117)
Bilirubin, Direct: 0.5 mg/dL — ABNORMAL HIGH (ref 0.0–0.3)
Total Bilirubin: 1.6 mg/dL — ABNORMAL HIGH (ref 0.2–1.2)
Total Protein: 7.4 g/dL (ref 6.0–8.3)

## 2024-06-11 LAB — HEMOGLOBIN A1C: Hgb A1c MFr Bld: 5.5 % (ref 4.6–6.5)

## 2024-06-17 ENCOUNTER — Ambulatory Visit: Payer: Self-pay | Admitting: Family Medicine

## 2024-06-28 ENCOUNTER — Other Ambulatory Visit: Payer: Self-pay | Admitting: Family Medicine

## 2024-06-28 DIAGNOSIS — E1165 Type 2 diabetes mellitus with hyperglycemia: Secondary | ICD-10-CM

## 2024-06-28 NOTE — Telephone Encounter (Signed)
 Copied from CRM #[id number]. Topic: Clinical - Medication Refill >> Jun 28, 2024  1:14 PM Jeshua R wrote: Medication: lisinopril -hydrochlorothiazide  (ZESTORETIC ) 20-12.5 MG tablet  Has the patient contacted their pharmacy? Yes (Agent: If no, request that the patient contact the pharmacy for the refill. If patient does not wish to contact the pharmacy document the reason why and proceed with request.) (Agent: If yes, when and what did the pharmacy advise?)  This is the patient's preferred pharmacy:  [id number] [place], [place] - [address] [place] [address] Phone: [phone] Fax: [phone]  Is this the correct pharmacy for this prescription? Yes If no, delete pharmacy and type the correct one.   Has the prescription been filled recently? Yes  Is the patient out of the medication? Yes  Has the patient been seen for an appointment in the last year OR does the patient have an upcoming appointment? Yes  Can we respond through MyChart? Yes  Agent: Please be advised that Rx refills may take up to 3 business days. We ask that you follow-up with your pharmacy.

## 2024-07-02 ENCOUNTER — Other Ambulatory Visit: Payer: Self-pay | Admitting: Family Medicine

## 2024-07-02 ENCOUNTER — Other Ambulatory Visit: Payer: Self-pay

## 2024-07-02 ENCOUNTER — Encounter: Payer: Self-pay | Admitting: Family Medicine

## 2024-07-02 ENCOUNTER — Telehealth: Payer: Self-pay

## 2024-07-02 DIAGNOSIS — E1165 Type 2 diabetes mellitus with hyperglycemia: Secondary | ICD-10-CM

## 2024-07-02 DIAGNOSIS — I10 Essential (primary) hypertension: Secondary | ICD-10-CM

## 2024-07-02 MED ORDER — LISINOPRIL-HYDROCHLOROTHIAZIDE 20-12.5 MG PO TABS: 1.0000 | ORAL_TABLET | Freq: Every day | ORAL | 3 refills | Status: AC

## 2024-07-02 NOTE — Telephone Encounter (Unsigned)
 Copied from CRM #[id number]. Topic: Clinical - Prescription Issue >> Jul 02, 2024 12:57 PM Ahlexyia S wrote: Reason for CRM: Pt called in requesting update on medication lisinopril -hydrochlorothiazide  (ZESTORETIC ) 20-12.5 MG tablet. Per pt chart pt requested a medication refill on 12/11 and there hasn't been an update yet but it does show the medication was refused. Contacted [facility] for more information and was told the medication was refused due to it being filled too soon. Pt requested refill on 12/11 but it wasn't supposed to be requested until 12/13. I informed pt with info and pt would like to speak to someone in regards to this. A medication refill request has also been put in as well.

## 2024-07-02 NOTE — Telephone Encounter (Signed)
Medication has been filled 

## 2024-07-02 NOTE — Telephone Encounter (Signed)
 Copied from CRM #[id number]. Topic: Clinical - Medication Refill >> Jul 02, 2024  1:01 PM Ahlexyia S wrote: Medication:  lisinopril -hydrochlorothiazide  (ZESTORETIC ) 20-12.5 MG tablet  Has the patient contacted their pharmacy? Yes, pt was told that pharmacy needs a new prescription.  (Agent: If no, request that the patient contact the pharmacy for the refill. If patient does not wish to contact the pharmacy document the reason why and proceed with request.) (Agent: If yes, when and what did the pharmacy advise?)  This is the patient's preferred pharmacy:  [facility] [id number] [place], [place] - [address] [place] [address] Phone: [phone] Fax: [phone]  Is this the correct pharmacy for this prescription? Yes If no, delete pharmacy and type the correct one.   Has the prescription been filled recently? Yes  Is the patient out of the medication? Yes  Has the patient been seen for an appointment in the last year OR does the patient have an upcoming appointment? Yes  Can we respond through MyChart? Yes  Agent: Please be advised that Rx refills may take up to 3 business days. We ask that you follow-up with your pharmacy.

## 2024-07-03 ENCOUNTER — Other Ambulatory Visit: Payer: Self-pay

## 2024-07-03 DIAGNOSIS — E1165 Type 2 diabetes mellitus with hyperglycemia: Secondary | ICD-10-CM

## 2024-07-03 MED ORDER — DROPLET PEN NEEDLES 31G X 8 MM MISC: 0 refills | Status: AC

## 2024-07-09 ENCOUNTER — Telehealth: Payer: Self-pay | Admitting: Family Medicine

## 2024-07-09 DIAGNOSIS — E785 Hyperlipidemia, unspecified: Secondary | ICD-10-CM

## 2024-07-09 NOTE — Telephone Encounter (Unsigned)
 Copied from CRM #[id number]. Topic: Clinical - Medication Refill >> Jul 09, 2024 12:52 PM Nessti S wrote: Medication: atorvastatin  (LIPITOR) 10 MG tablet   Has the patient contacted their pharmacy? Yes (Agent: If no, request that the patient contact the pharmacy for the refill. If patient does not wish to contact the pharmacy document the reason why and proceed with request.) (Agent: If yes, when and what did the pharmacy advise?)  This is the patient's preferred pharmacy:  [facility] [id number] [place], [place] - [address] [place] [address] Phone: [phone] Fax: [phone]  Is this the correct pharmacy for this prescription? Yes If no, delete pharmacy and type the correct one.   Has the prescription been filled recently? No  Is the patient out of the medication? Yes  Has the patient been seen for an appointment in the last year OR does the patient have an upcoming appointment? Yes  Can we respond through MyChart? Yes  Agent: Please be advised that Rx refills may take up to 3 business days. We ask that you follow-up with your pharmacy.

## 2024-07-10 MED ORDER — ATORVASTATIN CALCIUM 10 MG PO TABS: 10.0000 mg | ORAL_TABLET | Freq: Every day | ORAL | 0 refills | Status: AC

## 2024-07-16 MED ORDER — METOPROLOL SUCCINATE ER 25 MG PO TB24: 25.0000 mg | ORAL_TABLET | Freq: Every day | ORAL | 1 refills | Status: AC

## 2024-08-09 ENCOUNTER — Encounter: Payer: Self-pay | Admitting: Family Medicine

## 2024-08-09 ENCOUNTER — Ambulatory Visit: Admitting: Family Medicine

## 2024-08-09 VITALS — BP 134/52 | HR 78 | Temp 99.5°F | Resp 18 | Ht 75.0 in | Wt 273.6 lb

## 2024-08-09 DIAGNOSIS — Z8711 Personal history of peptic ulcer disease: Secondary | ICD-10-CM | POA: Diagnosis not present

## 2024-08-09 DIAGNOSIS — I4949 Other premature depolarization: Secondary | ICD-10-CM

## 2024-08-09 DIAGNOSIS — Z7984 Long term (current) use of oral hypoglycemic drugs: Secondary | ICD-10-CM

## 2024-08-09 DIAGNOSIS — E785 Hyperlipidemia, unspecified: Secondary | ICD-10-CM | POA: Diagnosis not present

## 2024-08-09 DIAGNOSIS — I10 Essential (primary) hypertension: Secondary | ICD-10-CM | POA: Diagnosis not present

## 2024-08-09 DIAGNOSIS — K219 Gastro-esophageal reflux disease without esophagitis: Secondary | ICD-10-CM

## 2024-08-09 DIAGNOSIS — K7460 Unspecified cirrhosis of liver: Secondary | ICD-10-CM | POA: Diagnosis not present

## 2024-08-09 DIAGNOSIS — E1165 Type 2 diabetes mellitus with hyperglycemia: Secondary | ICD-10-CM

## 2024-08-09 NOTE — Progress Notes (Signed)
 "  Subjective:  Patient ID: Nathaniel Velazquez, male    DOB: 10-24-1960  Age: 64 y.o. MRN: [id number]  CC:  Chief Complaint  Patient presents with   Follow-up    3 month follow up. No questions or concerns.     HPI [name] presents for   Diabetes: Complicated by hyperglycemia been significant improvement.  Initially with Lantus , lowering doses, along with metformin  500 mg twice daily.  Still on 10 units insulin  at his October visit.  Well-controlled A1c in November.  Option to stop insulin  given sufficient control, with option of metformin  only. He is on ACE inhibitor and statin. Still on lantus  10 units per day and metformin  500mg  BID. No side effects.  No lows.  Home readings: less frequent home readings recently. 121, 116, 124, 114, 103.  Microalbumin: Normal ratio 02/29/2024 Optho, foot exam, pneumovax: Up-to-date Trip to [place] in May for 2 weeks.   Lab Results  Component Value Date   HGBA1C 5.5 06/11/2024   HGBA1C 5.6 02/29/2024   HGBA1C 5.1 10/26/2023   Lab Results  Component Value Date   MICROALBUR 0.9 02/29/2024   LDLCALC 103 (H) 06/11/2024   CREATININE 0.87 06/11/2024   Hypertension: Borderline control in October, thought to have component of whitecoat hypertension.  Continued on Toprol -XL 25 mg daily, lisinopril  HCTZ 20/12.5 mg daily. No new side effects, and feeling ok Did not sleep well last night.   Home readings:130/70's.  BP Readings from Last 3 Encounters:  08/09/24 (!) 134/52  05/09/24 (!) 138/58  04/02/24 (!) 148/64   Lab Results  Component Value Date   CREATININE 0.87 06/11/2024   Polyarticular psoriatic arthritis.  Last ov with Dr. Mai on 1/15 - infusion about 60 days with bloodwork. Simponai Aria infusion every 60 days. Looking at other options. Will d/w rheumatologist in July.   Elevated lft's, cirrhosis: Seen by GI in past. EGD in 2023 - no varices.  Last US  in June 2023 - Evidence of hepatic cirrhosis with no focal hepatic mass  identified.Trace ascites. On protonix  every day for GERD.  Nonbleeding gastric ulcers gastritis noted on 03/08/2022 endoscopy.  Pantoprazole  started that time once per day, with plan for repeat endoscopy in 2 years for screening purposes Due for EGD and colonoscopy. Agrees to referral Alcohol - 2 every few days.  No jaundice/scleral icterus, n/v, abd pain.   Hyperlipidemia: Lipitor 10mg  every day.   No myalgias, or new side effects.  Lab Results  Component Value Date   CHOL 193 06/11/2024   HDL 58.80 06/11/2024   LDLCALC 103 (H) 06/11/2024   LDLDIRECT 91.3 06/19/2012   TRIG 153.0 (H) 06/11/2024   CHOLHDL 3 06/11/2024   Lab Results  Component Value Date   ALT 79 (H) 06/11/2024   ALT 79 (H) 06/11/2024   AST 83 (H) 06/11/2024   AST 83 (H) 06/11/2024   ALKPHOS 108 06/11/2024   ALKPHOS 108 06/11/2024   BILITOT 1.6 (H) 06/11/2024   BILITOT 1.6 (H) 06/11/2024    History Patient Active Problem List   Diagnosis Date Noted   Long term current use of therapeutic drug 04/02/2024   Basal cell carcinoma (BCC) 06/21/2023   Pain in joint of right ankle 06/22/2021   Pain in right ankle and joints of right foot 06/22/2021   Perianal ulcer (HCC) 12/02/2020   High risk medication use 01/14/2020   Pain in left knee 06/28/2019   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   Body mass index (BMI) 33.0-33.9, adult 09/19/2018  Family history of cardiovascular disease 09/19/2018   Moderate alcohol consumption 09/19/2018   Psoriasis 09/19/2018   Psoriatic arthritis (HCC) 09/19/2018   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   Stiffness of right shoulder joint 08/19/2017   Stiffness of right shoulder, not elsewhere classified 08/19/2017   Traumatic complete tear of rotator cuff 08/04/2017   Strain of muscle(s) and tendon(s) of the rotator cuff of right shoulder, subsequent encounter 08/04/2017   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   Dysfunction of right eustachian tube  11/18/2015   Anal fissure 07/22/2015   Hypertensive disorder 07/03/2015   Essential hypertension 07/03/2015   Obesity    Hyperlipidemia 11/21/2008   PALPITATIONS 10/30/2008   Past Medical History:  Diagnosis Date   Arthritis    Cancer (HCC)    skin cancer   GERD (gastroesophageal reflux disease)    Hypertension    Obesity    Steroid injection in rt knee   Prediabetes 03/01/2022   hgA1c was 6.1, checks cbg at home, no meds at this time   Past Surgical History:  Procedure Laterality Date   ANAL FISTULECTOMY  1987   APPENDECTOMY     COLONOSCOPY  04/08/2021   KNEE SURGERY Bilateral    meniscal tear repairs   POLYPECTOMY     ROTATOR CUFF REPAIR Bilateral 2018   TONSILLECTOMY     UPPER GASTROINTESTINAL ENDOSCOPY     Allergies[1] Prior to Admission medications  Medication Sig Start Date End Date Taking? Authorizing Provider  Accu-Chek Softclix Lancets lancets USE AS INSTRUCTED TO CHECK GLUCOSE DAILY AS DIRECTED 11/02/23  Yes Levora Reyes SAUNDERS, MD  alfuzosin (UROXATRAL) 10 MG 24 hr tablet Take 10 mg by mouth daily with breakfast.   Yes [provider]  atorvastatin  (LIPITOR) 10 MG tablet Take 1 tablet (10 mg total) by mouth daily. 07/10/24  Yes Levora Reyes SAUNDERS, MD  blood glucose meter kit and supplies Dispense based on patient and insurance preference. Use up to four times daily as directed. (FOR ICD-10 E10.9, E11.9).once per day fasting or 2 hours after meals. 10/15/21  Yes Levora Reyes SAUNDERS, MD  clobetasol ointment (TEMOVATE) 0.05 % as needed. As directed 05/15/12  Yes [provider]  glucose blood (ACCU-CHEK GUIDE TEST) test strip USE THREE TIMES A DAY FASTING OR 2 HOURS AFTER A MEAL 01/24/24  Yes Levora Reyes SAUNDERS, MD  golimumab (SIMPONI ARIA) 50 MG/4ML SOLN injection Inject 50 mg into the vein every 8 (eight) weeks.   Yes [provider]  insulin  glargine (LANTUS  SOLOSTAR) 100 UNIT/ML Solostar Pen INJECT 20 UNITS UNDER THE SKIN DAILY 05/07/24  Yes  Levora Reyes SAUNDERS, MD  Insulin  Pen Needle (DROPLET PEN NEEDLES) 31G X 8 MM MISC TO INJECT LANTUS  UNDER THE SKIN 07/03/24  Yes Levora Reyes SAUNDERS, MD  lisinopril -hydrochlorothiazide  (ZESTORETIC ) 20-12.5 MG tablet Take 1 tablet by mouth daily. 07/02/24  Yes Levora Reyes SAUNDERS, MD  metFORMIN  (GLUCOPHAGE ) 500 MG tablet TAKE 1 TABLET BY MOUTH 2 TIMES A DAY WITH A MEAL. START EVERY DAY FOR INITIAL WEEK AS DIRECTED 02/23/24  Yes Levora Reyes SAUNDERS, MD  metoprolol  succinate (TOPROL -XL) 25 MG 24 hr tablet Take 1 tablet (25 mg total) by mouth daily. 07/16/24  Yes Levora Reyes SAUNDERS, MD  pantoprazole  (PROTONIX ) 40 MG tablet TAKE 1 TABLET BY MOUTH DAILY 02/27/24  Yes Levora Reyes SAUNDERS, MD  predniSONE  (DELTASONE ) 5 MG tablet 2 tablets as needed for flares Orally Once a day; Duration: 30 days Patient taking differently: Take 5 mg by  mouth as needed.   Yes [provider]  psyllium (METAMUCIL) 58.6 % powder Take 1 packet by mouth 2 (two) times daily.    Yes [provider]  sildenafil  (REVATIO ) 20 MG tablet TAKE 1 TO 3 TABLETS BY MOUTH DAILY AS NEEDED 12/06/23  Yes Levora Reyes SAUNDERS, MD  valACYclovir (VALTREX) 500 MG tablet Take by mouth as needed. 02/23/21  Yes [provider]  finasteride (PROSCAR) 5 MG tablet Take 5 mg by mouth daily. Patient not taking: Reported on 08/09/2024 11/19/21   [provider]   Social History   Socioeconomic History   Marital status: Married    Spouse name: Not on file   Number of children: Not on file   Years of education: Not on file   Highest education level: Bachelor's degree (e.g., BA, AB, BS)  Occupational History   Not on file  Tobacco Use   Smoking status: Never   Smokeless tobacco: Never  Vaping Use   Vaping status: Never Used  Substance and Sexual Activity   Alcohol use: Not Currently    Comment: occ   Drug use: Never   Sexual activity: Yes  Other Topics Concern   Not on file  Social History Narrative   Sales rep   Social Drivers  of Health   Tobacco Use: Low Risk (08/09/2024)   Patient History    Smoking Tobacco Use: Never    Smokeless Tobacco Use: Never    Passive Exposure: Not on file  Financial Resource Strain: Low Risk (08/08/2024)   Overall Financial Resource Strain (CARDIA)    Difficulty of Paying Living Expenses: Not hard at all  Food Insecurity: Unknown (08/08/2024)   Epic    Worried About Programme Researcher, Broadcasting/film/video in the Last Year: Never true    [employer] of Food in the Last Year: Not on file  Transportation Needs: No Transportation Needs (02/28/2023)   PRAPARE - Administrator, Civil Service (Medical): No    Lack of Transportation (Non-Medical): No  Physical Activity: Insufficiently Active (08/08/2024)   Exercise Vital Sign    Days of Exercise per Week: 4 days    Minutes of Exercise per Session: 20 min  Stress: No Stress Concern Present (08/08/2024)   [employer] of Occupational Health - Occupational Stress Questionnaire    Feeling of Stress: Not at all  Social Connections: Unknown (08/08/2024)   Social Connection and Isolation Panel    Frequency of Communication with Friends and Family: More than three times a week    Frequency of Social Gatherings with Friends and Family: Once a week    Attends Religious Services: Patient declined    Database Administrator or Organizations: Patient declined    Attends Banker Meetings: Not on file    Marital Status: Married  Intimate Partner Violence: Not on file  Depression (PHQ2-9): Low Risk (11/28/2023)   Depression (PHQ2-9)    PHQ-2 Score: 0  Alcohol Screen: Low Risk (02/28/2023)   Alcohol Screen    Last Alcohol Screening Score (AUDIT): 3  Housing: Low Risk (02/28/2023)   Housing    Last Housing Risk Score: 0  Utilities: Not on file  Health Literacy: Not on file    Review of Systems  Constitutional:  Negative for fatigue and unexpected weight change.  Eyes:  Negative for visual disturbance.  Respiratory:  Negative for cough, chest  tightness and shortness of breath.   Cardiovascular:  Negative for chest pain, palpitations and leg swelling.  Gastrointestinal:  Negative for abdominal pain and blood in stool.  Neurological:  Negative for dizziness, light-headedness and headaches.     Objective:   Vitals:   08/09/24 1513  BP: (!) 134/52  Pulse: 78  Resp: 18  Temp: 99.5 F (37.5 C)  TempSrc: Temporal  SpO2: 97%  Weight: 273 lb 9.6 oz (124.1 kg)  Height: 6' 3 (1.905 m)     Physical Exam Vitals reviewed.  Constitutional:      Appearance: He is well-developed.  HENT:     Head: Normocephalic and atraumatic.  Neck:     Vascular: No carotid bruit or JVD.  Cardiovascular:     Rate and Rhythm: Normal rate and regular rhythm.     Heart sounds: Normal heart sounds. No murmur heard.    Comments: Few ectopic beats on exam, overall regular rate, rhythm. Pulmonary:     Effort: Pulmonary effort is normal.     Breath sounds: Normal breath sounds. No rales.  Musculoskeletal:     Right lower leg: No edema.     Left lower leg: No edema.  Skin:    General: Skin is warm and dry.  Neurological:     Mental Status: He is alert and oriented to person, place, and time.  Psychiatric:        Mood and Affect: Mood normal.     EKG sinus rhythm, rate 67.  No ectopy.   Assessment & Plan:  COUGAR IMEL is a 64 y.o. male . Gastroesophageal reflux disease, unspecified whether esophagitis present - Plan: Ambulatory referral to Gastroenterology History of gastric ulcer - Plan: Ambulatory referral to Gastroenterology Hepatic cirrhosis, unspecified hepatic cirrhosis type, unspecified whether ascites present [facility]) - Plan: Ambulatory referral to Gastroenterology  - Due for repeat EGD, colonoscopy with cirrhosis will refer to gastroenterology for follow-up, discussion of repeat testing or additional testing/monitoring.  Hyperlipidemia, unspecified hyperlipidemia type  - Tolerating statin, recent LDL noted.  Continue  same.  Ectopic beat - Plan: EKG 12-Lead  - Few ectopic beats noted on exam but reassuring EKG.  No further workup at this time as asymptomatic.  Type 2 diabetes mellitus with hyperglycemia, unspecified whether long term insulin  use (HCC)  - Well-controlled, option to stop Lantus  temporarily, home monitoring of glycemic control with option to restart Lantus  but I think he will do well with just metformin  at this time.  Handout given on goals and recommended readings.  RTC precautions.  Essential hypertension Stable on current regimen.  No orders of the defined types were placed in this encounter.  Patient Instructions  Last A1c look great.  Based on current level control I think we do have the option of trying to come off insulin  temporarily and see if the readings remain stable on just metformin .  Hold insulin  temporarily, and keep a record of your blood sugars. If numbers creep up - can return to insulin  use.  Let me know if there are questions on this plan.  I did provide some guidance below on goal blood sugars on home readings.   No other med changes at this time.  With previous history of liver cirrhosis and recommendations from gastroenterology, I will send a referral for you to follow-up with them for likely repeat endoscopy and colonoscopy.  On your exam I heard an occasional extra heartbeat but I do not see any concerns on the EKG.    Type 2 Diabetes Mellitus, Self-Care, Adult Caring for yourself after you have been diagnosed with type 2  diabetes (type 2 diabetes mellitus) means keeping your blood sugar (glucose) under control with a balance of: Nutrition. Exercise. Lifestyle changes. Medicines or insulin , if needed. Support from your team of health care providers and others. What are the risks? Having type 2 diabetes can put you at risk for other long-term (chronic) conditions, such as heart disease and kidney disease. Your health care provider may prescribe medicines to help  prevent complications from diabetes. How to monitor your blood glucose  Check your blood glucose every day or as often as told by your health care provider. Have your A1C (hemoglobin A1C) level checked two or more times a year, or as often as told by your health care provider. Your health care provider will set personalized treatment goals for you. Generally, the goal of treatment is to maintain the following blood glucose levels: Before meals: 80-130 mg/dL (4.4-7.2 mmol/L). After meals: below 180 mg/dL (10 mmol/L). A1C level: less than 7%. How to manage hyperglycemia and hypoglycemia Hyperglycemia symptoms Hyperglycemia, also called high blood glucose, occurs when blood glucose is too high. Make sure you know the early signs of hyperglycemia, such as: Increased thirst. Hunger. Feeling very tired. Needing to urinate more often than usual. Blurry vision. Hypoglycemia symptoms Hypoglycemia, also called low blood glucose, occurs with a blood glucose level at or below 70 mg/dL (3.9 mmol/L). Diabetes medicines lower your blood glucose and can cause hypoglycemia. The risk for hypoglycemia increases during or after exercise, during sleep, during illness, and when skipping meals or not eating for a long time (fasting). It is important to know the symptoms of hypoglycemia and treat it right away. Always have a 15-gram rapid-acting carbohydrate snack with you to treat low blood glucose. Family members and close friends should also know the symptoms and understand how to treat hypoglycemia, in case you are not able to treat yourself. Symptoms may include: Hunger. Anxiety. Sweating and feeling clammy. Dizziness or feeling light-headed. Sleepiness. Increased heart rate. Irritability. Tingling or numbness around the mouth, lips, or tongue. Restless sleep. Severe hypoglycemia is when your blood glucose level is at or below 54 mg/dL (3 mmol/L). Severe hypoglycemia is an emergency. Do not wait to see if  the symptoms will go away. Get medical help right away. Call your local emergency services (911 in the [place] Do not drive yourself to the hospital. If you have severe hypoglycemia and you cannot eat or drink, you may need glucagon. A family member or close friend should learn how to check your blood glucose and how to give you glucagon. Ask your health care provider if you need to have an emergency glucagon kit available. Follow these instructions at home: Medicines Take prescribed insulin  or diabetes medicines as told by your health care provider. Do not run out of insulin  or other diabetes medicines. Plan ahead so you always have these available. If you use insulin , adjust your dosage based on your physical activity and what foods you eat. Your health care provider will tell you how to adjust your dosage. Take over-the-counter and prescription medicines only as told by your health care provider. Eating and drinking  What you eat and drink affects your blood glucose and your insulin  dosage. Making good choices helps to control your diabetes and prevent other health problems. A healthy meal plan includes eating lean proteins, complex carbohydrates, fresh fruits and vegetables, low-fat dairy products, and healthy fats. Make an appointment to see a registered dietitian to help you create an eating plan that is right for  you. Make sure that you: Follow instructions from your health care provider about eating or drinking restrictions. Drink enough fluid to keep your urine pale yellow. Keep a record of the carbohydrates that you eat. Do this by reading food labels and learning the standard serving sizes of foods. Follow your sick-day plan whenever you cannot eat or drink as usual. Make this plan in advance with your health care provider.  Activity Stay active. Exercise regularly, as told by your health care provider. This may include: Stretching and doing strength exercises, such as yoga or weight  lifting, two or more times a week. Doing 150 minutes or more of moderate-intensity or vigorous-intensity exercise each week. This could be brisk walking, biking, or water aerobics. Spread out your activity over 3 or more days of the week. Do not go more than 2 days in a row without doing some kind of physical activity. When you start a new exercise or activity, work with your health care provider to adjust your insulin , medicines, or food intake as needed. Lifestyle Do not use any products that contain nicotine or tobacco. These products include cigarettes, chewing tobacco, and vaping devices, such as e-cigarettes. If you need help quitting, ask your health care provider. If you drink alcohol and your health care provider says that it is safe for you: Limit how much you have to: 0-1 drink a day for women who are not pregnant. 0-2 drinks a day for men. Know how much alcohol is in your drink. In the [place] one drink equals one 12 oz bottle of beer (355 mL), one 5 oz glass of wine (148 mL), or one 1 oz glass of hard liquor (44 mL). Learn to manage stress. If you need help with this, ask your health care provider. Take care of your body  Keep your immunizations up to date. In addition to getting vaccinations as told by your health care provider, it is recommended that you get vaccinated against the following illnesses: The flu (influenza). Get a flu shot every year. Pneumonia. Hepatitis B. Schedule an eye exam soon after your diagnosis, and then one time every year after that. Check your skin and feet every day for cuts, bruises, redness, blisters, or sores. Schedule a foot exam with your health care provider once every year. Brush your teeth and gums two times a day, and floss one or more times a day. Visit your dentist one or more times every 6 months. Maintain a healthy weight. General instructions Share your diabetes management plan with people in your workplace, school, and household. Carry  a medical alert card or wear medical alert jewelry. Keep all follow-up visits. This is important. Questions to ask your health care provider Should I meet with a certified diabetes care and education specialist? Where can I find a support group for people with diabetes? Where to find more information For help and guidance and for more information about diabetes, please visit: American Diabetes Association (ADA): www.diabetes.org American Association of Diabetes Care and Education Specialists (ADCES): www.diabeteseducator.org International Diabetes Federation (IDF): dconly.dk Summary Caring for yourself after you have been diagnosed with type 2 diabetes (type 2 diabetes mellitus) means keeping your blood sugar (glucose) under control with a balance of nutrition, exercise, lifestyle changes, and medicine. Check your blood glucose every day, as often as told by your health care provider. Having diabetes can put you at risk for other long-term (chronic) conditions, such as heart disease and kidney disease. Your health care provider may prescribe medicines  to help prevent complications from diabetes. Share your diabetes management plan with people in your workplace, school, and household. Keep all follow-up visits. This is important. This information is not intended to replace advice given to you by your health care provider. Make sure you discuss any questions you have with your health care provider. Document Revised: 12/03/2020 Document Reviewed: 12/03/2020 Elsevier Patient Education  2024 Elsevier Inc.    Signed,   Reyes Pines, MD [facility], [facility] 08/09/24 4:13 PM      [1]  Allergies Allergen Reactions   Gabapentin  Other (See Comments)    Made very jittery   "

## 2024-08-09 NOTE — Patient Instructions (Addendum)
 Last A1c look great.  Based on current level control I think we do have the option of trying to come off insulin  temporarily and see if the readings remain stable on just metformin .  Hold insulin  temporarily, and keep a record of your blood sugars. If numbers creep up - can return to insulin  use.  Let me know if there are questions on this plan.  I did provide some guidance below on goal blood sugars on home readings.   No other med changes at this time.  With previous history of liver cirrhosis and recommendations from gastroenterology, I will send a referral for you to follow-up with them for likely repeat endoscopy and colonoscopy.  On your exam I heard an occasional extra heartbeat but I do not see any concerns on the EKG.    Type 2 Diabetes Mellitus, Self-Care, Adult Caring for yourself after you have been diagnosed with type 2 diabetes (type 2 diabetes mellitus) means keeping your blood sugar (glucose) under control with a balance of: Nutrition. Exercise. Lifestyle changes. Medicines or insulin , if needed. Support from your team of health care providers and others. What are the risks? Having type 2 diabetes can put you at risk for other long-term (chronic) conditions, such as heart disease and kidney disease. Your health care provider may prescribe medicines to help prevent complications from diabetes. How to monitor your blood glucose  Check your blood glucose every day or as often as told by your health care provider. Have your A1C (hemoglobin A1C) level checked two or more times a year, or as often as told by your health care provider. Your health care provider will set personalized treatment goals for you. Generally, the goal of treatment is to maintain the following blood glucose levels: Before meals: 80-130 mg/dL (4.4-7.2 mmol/L). After meals: below 180 mg/dL (10 mmol/L). A1C level: less than 7%. How to manage hyperglycemia and hypoglycemia Hyperglycemia symptoms Hyperglycemia,  also called high blood glucose, occurs when blood glucose is too high. Make sure you know the early signs of hyperglycemia, such as: Increased thirst. Hunger. Feeling very tired. Needing to urinate more often than usual. Blurry vision. Hypoglycemia symptoms Hypoglycemia, also called low blood glucose, occurs with a blood glucose level at or below 70 mg/dL (3.9 mmol/L). Diabetes medicines lower your blood glucose and can cause hypoglycemia. The risk for hypoglycemia increases during or after exercise, during sleep, during illness, and when skipping meals or not eating for a long time (fasting). It is important to know the symptoms of hypoglycemia and treat it right away. Always have a 15-gram rapid-acting carbohydrate snack with you to treat low blood glucose. Family members and close friends should also know the symptoms and understand how to treat hypoglycemia, in case you are not able to treat yourself. Symptoms may include: Hunger. Anxiety. Sweating and feeling clammy. Dizziness or feeling light-headed. Sleepiness. Increased heart rate. Irritability. Tingling or numbness around the mouth, lips, or tongue. Restless sleep. Severe hypoglycemia is when your blood glucose level is at or below 54 mg/dL (3 mmol/L). Severe hypoglycemia is an emergency. Do not wait to see if the symptoms will go away. Get medical help right away. Call your local emergency services (911 in the [place] Do not drive yourself to the hospital. If you have severe hypoglycemia and you cannot eat or drink, you may need glucagon. A family member or close friend should learn how to check your blood glucose and how to give you glucagon. Ask your health care provider if  you need to have an emergency glucagon kit available. Follow these instructions at home: Medicines Take prescribed insulin  or diabetes medicines as told by your health care provider. Do not run out of insulin  or other diabetes medicines. Plan ahead so you always  have these available. If you use insulin , adjust your dosage based on your physical activity and what foods you eat. Your health care provider will tell you how to adjust your dosage. Take over-the-counter and prescription medicines only as told by your health care provider. Eating and drinking  What you eat and drink affects your blood glucose and your insulin  dosage. Making good choices helps to control your diabetes and prevent other health problems. A healthy meal plan includes eating lean proteins, complex carbohydrates, fresh fruits and vegetables, low-fat dairy products, and healthy fats. Make an appointment to see a registered dietitian to help you create an eating plan that is right for you. Make sure that you: Follow instructions from your health care provider about eating or drinking restrictions. Drink enough fluid to keep your urine pale yellow. Keep a record of the carbohydrates that you eat. Do this by reading food labels and learning the standard serving sizes of foods. Follow your sick-day plan whenever you cannot eat or drink as usual. Make this plan in advance with your health care provider.  Activity Stay active. Exercise regularly, as told by your health care provider. This may include: Stretching and doing strength exercises, such as yoga or weight lifting, two or more times a week. Doing 150 minutes or more of moderate-intensity or vigorous-intensity exercise each week. This could be brisk walking, biking, or water aerobics. Spread out your activity over 3 or more days of the week. Do not go more than 2 days in a row without doing some kind of physical activity. When you start a new exercise or activity, work with your health care provider to adjust your insulin , medicines, or food intake as needed. Lifestyle Do not use any products that contain nicotine or tobacco. These products include cigarettes, chewing tobacco, and vaping devices, such as e-cigarettes. If you need  help quitting, ask your health care provider. If you drink alcohol and your health care provider says that it is safe for you: Limit how much you have to: 0-1 drink a day for women who are not pregnant. 0-2 drinks a day for men. Know how much alcohol is in your drink. In the [place] one drink equals one 12 oz bottle of beer (355 mL), one 5 oz glass of wine (148 mL), or one 1 oz glass of hard liquor (44 mL). Learn to manage stress. If you need help with this, ask your health care provider. Take care of your body  Keep your immunizations up to date. In addition to getting vaccinations as told by your health care provider, it is recommended that you get vaccinated against the following illnesses: The flu (influenza). Get a flu shot every year. Pneumonia. Hepatitis B. Schedule an eye exam soon after your diagnosis, and then one time every year after that. Check your skin and feet every day for cuts, bruises, redness, blisters, or sores. Schedule a foot exam with your health care provider once every year. Brush your teeth and gums two times a day, and floss one or more times a day. Visit your dentist one or more times every 6 months. Maintain a healthy weight. General instructions Share your diabetes management plan with people in your workplace, school, and household. Carry a  medical alert card or wear medical alert jewelry. Keep all follow-up visits. This is important. Questions to ask your health care provider Should I meet with a certified diabetes care and education specialist? Where can I find a support group for people with diabetes? Where to find more information For help and guidance and for more information about diabetes, please visit: American Diabetes Association (ADA): www.diabetes.org American Association of Diabetes Care and Education Specialists (ADCES): www.diabeteseducator.org International Diabetes Federation (IDF): dconly.dk Summary Caring for yourself after you have  been diagnosed with type 2 diabetes (type 2 diabetes mellitus) means keeping your blood sugar (glucose) under control with a balance of nutrition, exercise, lifestyle changes, and medicine. Check your blood glucose every day, as often as told by your health care provider. Having diabetes can put you at risk for other long-term (chronic) conditions, such as heart disease and kidney disease. Your health care provider may prescribe medicines to help prevent complications from diabetes. Share your diabetes management plan with people in your workplace, school, and household. Keep all follow-up visits. This is important. This information is not intended to replace advice given to you by your health care provider. Make sure you discuss any questions you have with your health care provider. Document Revised: 12/03/2020 Document Reviewed: 12/03/2020 [employer]vier Patient Education  2024 [employer].

## 2024-11-05 ENCOUNTER — Ambulatory Visit: Admitting: Family Medicine
